# Patient Record
Sex: Female | Born: 1970 | Race: Black or African American | Hispanic: No | Marital: Married | Smoking: Former smoker
Health system: Southern US, Community
[De-identification: ages and names within clinical notes are randomized; demographics above are authoritative.]

## PROBLEM LIST (undated history)

## (undated) DIAGNOSIS — J90 Pleural effusion, not elsewhere classified: Secondary | ICD-10-CM

## (undated) DIAGNOSIS — Z8042 Family history of malignant neoplasm of prostate: Secondary | ICD-10-CM

## (undated) DIAGNOSIS — R51 Headache: Secondary | ICD-10-CM

## (undated) DIAGNOSIS — C50919 Malignant neoplasm of unspecified site of unspecified female breast: Secondary | ICD-10-CM

## (undated) DIAGNOSIS — I10 Essential (primary) hypertension: Secondary | ICD-10-CM

## (undated) DIAGNOSIS — K219 Gastro-esophageal reflux disease without esophagitis: Secondary | ICD-10-CM

## (undated) DIAGNOSIS — K220 Achalasia of cardia: Secondary | ICD-10-CM

## (undated) DIAGNOSIS — Z803 Family history of malignant neoplasm of breast: Secondary | ICD-10-CM

## (undated) HISTORY — DX: Malignant neoplasm of unspecified site of unspecified female breast: C50.919

## (undated) HISTORY — DX: Headache: R51

## (undated) HISTORY — DX: Pleural effusion, not elsewhere classified: J90

## (undated) HISTORY — DX: Family history of malignant neoplasm of prostate: Z80.42

## (undated) HISTORY — DX: Gastro-esophageal reflux disease without esophagitis: K21.9

## (undated) HISTORY — DX: Family history of malignant neoplasm of breast: Z80.3

---

## 1989-09-10 HISTORY — PX: OTHER SURGICAL HISTORY: SHX169

## 1993-09-10 HISTORY — PX: TONSILLECTOMY: SUR1361

## 1998-12-20 ENCOUNTER — Other Ambulatory Visit: Admission: RE | Admit: 1998-12-20 | Discharge: 1998-12-20 | Payer: Self-pay | Admitting: Obstetrics and Gynecology

## 1999-07-06 ENCOUNTER — Inpatient Hospital Stay (HOSPITAL_COMMUNITY): Admission: AD | Admit: 1999-07-06 | Discharge: 1999-07-10 | Payer: Self-pay | Admitting: Obstetrics and Gynecology

## 1999-07-11 ENCOUNTER — Encounter: Admission: RE | Admit: 1999-07-11 | Discharge: 1999-10-09 | Payer: Self-pay | Admitting: Obstetrics and Gynecology

## 1999-08-07 ENCOUNTER — Emergency Department (HOSPITAL_COMMUNITY): Admission: EM | Admit: 1999-08-07 | Discharge: 1999-08-07 | Payer: Self-pay | Admitting: Emergency Medicine

## 1999-08-09 ENCOUNTER — Other Ambulatory Visit: Admission: RE | Admit: 1999-08-09 | Discharge: 1999-08-09 | Payer: Self-pay | Admitting: Obstetrics and Gynecology

## 2000-09-26 ENCOUNTER — Ambulatory Visit (HOSPITAL_COMMUNITY): Admission: RE | Admit: 2000-09-26 | Discharge: 2000-09-26 | Payer: Self-pay | Admitting: Cardiology

## 2000-09-26 ENCOUNTER — Encounter: Payer: Self-pay | Admitting: Cardiology

## 2001-07-23 ENCOUNTER — Other Ambulatory Visit: Admission: RE | Admit: 2001-07-23 | Discharge: 2001-07-23 | Payer: Self-pay | Admitting: Obstetrics and Gynecology

## 2002-02-26 ENCOUNTER — Inpatient Hospital Stay (HOSPITAL_COMMUNITY): Admission: AD | Admit: 2002-02-26 | Discharge: 2002-02-28 | Payer: Self-pay | Admitting: Obstetrics and Gynecology

## 2002-08-27 ENCOUNTER — Other Ambulatory Visit: Admission: RE | Admit: 2002-08-27 | Discharge: 2002-08-27 | Payer: Self-pay | Admitting: Obstetrics and Gynecology

## 2003-04-21 ENCOUNTER — Ambulatory Visit: Admission: RE | Admit: 2003-04-21 | Discharge: 2003-04-21 | Payer: Self-pay | Admitting: Cardiology

## 2005-12-24 ENCOUNTER — Other Ambulatory Visit: Admission: RE | Admit: 2005-12-24 | Discharge: 2005-12-24 | Payer: Self-pay | Admitting: Gynecology

## 2011-05-15 ENCOUNTER — Inpatient Hospital Stay (HOSPITAL_COMMUNITY)
Admission: EM | Admit: 2011-05-15 | Discharge: 2011-05-17 | DRG: 392 | Disposition: A | Discharge status: Home / Self Care | Payer: Self-pay | Attending: Internal Medicine | Admitting: Internal Medicine

## 2011-05-15 ENCOUNTER — Emergency Department (HOSPITAL_COMMUNITY): Payer: Self-pay

## 2011-05-15 DIAGNOSIS — K222 Esophageal obstruction: Secondary | ICD-10-CM | POA: Diagnosis present

## 2011-05-15 DIAGNOSIS — D509 Iron deficiency anemia, unspecified: Secondary | ICD-10-CM | POA: Diagnosis present

## 2011-05-15 DIAGNOSIS — R0789 Other chest pain: Secondary | ICD-10-CM | POA: Diagnosis present

## 2011-05-15 DIAGNOSIS — K2980 Duodenitis without bleeding: Secondary | ICD-10-CM | POA: Diagnosis present

## 2011-05-15 DIAGNOSIS — K219 Gastro-esophageal reflux disease without esophagitis: Secondary | ICD-10-CM | POA: Diagnosis present

## 2011-05-15 DIAGNOSIS — K220 Achalasia of cardia: Principal | ICD-10-CM | POA: Diagnosis present

## 2011-05-15 LAB — CBC
HCT: 34.9 % — ABNORMAL LOW (ref 36.0–46.0)
HCT: 28.6 % — ABNORMAL LOW (ref 36.0–46.0)
Hemoglobin: 11.4 g/dL — ABNORMAL LOW (ref 12.0–15.0)
Hemoglobin: 9.5 g/dL — ABNORMAL LOW (ref 12.0–15.0)
MCH: 21.1 pg — ABNORMAL LOW (ref 26.0–34.0)
MCH: 20.9 pg — ABNORMAL LOW (ref 26.0–34.0)
MCHC: 32.7 g/dL (ref 30.0–36.0)
MCHC: 32.5 g/dL (ref 30.0–36.0)
MCV: 64.6 fL — ABNORMAL LOW (ref 78.0–100.0)
MCV: 64.4 fL — ABNORMAL LOW (ref 78.0–100.0)
Platelets: 232 10*3/uL (ref 150–400)
Platelets: 213 10*3/uL (ref 150–400)
RBC: 5.4 MIL/uL — ABNORMAL HIGH (ref 3.87–5.11)
RBC: 4.44 MIL/uL (ref 3.87–5.11)
RDW: 15.9 % — ABNORMAL HIGH (ref 11.5–15.5)
RDW: 15.8 % — ABNORMAL HIGH (ref 11.5–15.5)
WBC: 13.6 10*3/uL — ABNORMAL HIGH (ref 4.0–10.5)
WBC: 12.5 10*3/uL — ABNORMAL HIGH (ref 4.0–10.5)

## 2011-05-15 LAB — POCT I-STAT TROPONIN I: Troponin i, poc: 0 ng/mL (ref 0.00–0.08)

## 2011-05-15 LAB — DIFFERENTIAL
Basophils Absolute: 0 10*3/uL (ref 0.0–0.1)
Basophils Relative: 0 % (ref 0–1)
Eosinophils Absolute: 0.1 10*3/uL (ref 0.0–0.7)
Eosinophils Relative: 1 % (ref 0–5)
Lymphocytes Relative: 14 % (ref 12–46)
Lymphs Abs: 1.9 10*3/uL (ref 0.7–4.0)
Monocytes Absolute: 1.5 10*3/uL — ABNORMAL HIGH (ref 0.1–1.0)
Monocytes Relative: 11 % (ref 3–12)
Neutro Abs: 10.1 10*3/uL — ABNORMAL HIGH (ref 1.7–7.7)
Neutrophils Relative %: 74 % (ref 43–77)

## 2011-05-15 LAB — CK TOTAL AND CKMB (NOT AT ARMC)
CK, MB: 1.2 ng/mL (ref 0.3–4.0)
Relative Index: INVALID (ref 0.0–2.5)
Total CK: 70 U/L (ref 7–177)

## 2011-05-15 LAB — BASIC METABOLIC PANEL
BUN: 12 mg/dL (ref 6–23)
CO2: 23 mEq/L (ref 19–32)
Calcium: 9.4 mg/dL (ref 8.4–10.5)
Chloride: 107 mEq/L (ref 96–112)
Creatinine, Ser: 0.62 mg/dL (ref 0.50–1.10)
GFR calc Af Amer: >60 mL/min (ref >60)
GFR calc non Af Amer: >60 mL/min (ref >60)
Glucose, Bld: 83 mg/dL (ref 70–99)
Potassium: 3.5 mEq/L (ref 3.5–5.1)
Sodium: 142 mEq/L (ref 135–145)

## 2011-05-15 LAB — PROTIME-INR
INR: 1.29 (ref 0.00–1.49)
Prothrombin Time: 16.3 seconds — ABNORMAL HIGH (ref 11.6–15.2)

## 2011-05-15 LAB — D-DIMER, QUANTITATIVE: D-Dimer, Quant: 1.14 ug/mL-FEU — ABNORMAL HIGH (ref 0.00–0.48)

## 2011-05-15 MED ORDER — IOHEXOL 300 MG/ML  SOLN
100.0000 mL | Freq: Once | INTRAMUSCULAR | Status: AC | PRN
  Administered 2011-05-15: 100 mL via INTRAVENOUS

## 2011-05-16 LAB — COMPREHENSIVE METABOLIC PANEL
ALT: 11 U/L (ref 0–35)
AST: 10 U/L (ref 0–37)
Albumin: 3.1 g/dL — ABNORMAL LOW (ref 3.5–5.2)
Alkaline Phosphatase: 41 U/L (ref 39–117)
BUN: 9 mg/dL (ref 6–23)
CO2: 22 mEq/L (ref 19–32)
Calcium: 8.1 mg/dL — ABNORMAL LOW (ref 8.4–10.5)
Chloride: 106 mEq/L (ref 96–112)
Creatinine, Ser: 0.77 mg/dL (ref 0.50–1.10)
GFR calc Af Amer: >60 mL/min (ref >60)
GFR calc non Af Amer: >60 mL/min (ref >60)
Glucose, Bld: 119 mg/dL — ABNORMAL HIGH (ref 70–99)
Potassium: 3.7 mEq/L (ref 3.5–5.1)
Sodium: 137 mEq/L (ref 135–145)
Total Bilirubin: 0.9 mg/dL (ref 0.3–1.2)
Total Protein: 6.3 g/dL (ref 6.0–8.3)

## 2011-05-16 LAB — FERRITIN: Ferritin: 410 ng/mL — ABNORMAL HIGH (ref 10–291)

## 2011-05-16 LAB — IRON AND TIBC
Iron: <10 ug/dL — ABNORMAL LOW (ref 42–135)
UIBC: 170 ug/dL (ref 125–400)

## 2011-05-16 LAB — VITAMIN B12: Vitamin B-12: 483 pg/mL (ref 211–911)

## 2011-05-16 LAB — FOLATE: Folate: >20 ng/mL

## 2011-05-16 NOTE — H&P (Signed)
NAMEGERALINE, [name] NO.:  [id number]  MEDICAL RECORD NO.:  [id number]  LOCATION:  WLED                         FACILITY:  [facility]  PHYSICIAN:  Marinda Elk, M.D.DATE OF BIRTH:  1971-03-14  DATE OF ADMISSION:  05/15/2011 DATE OF DISCHARGE:                             HISTORY & PHYSICAL   PRIMARY CARE DOCTOR:  None.  CHIEF COMPLAINT:  Chest discomfort.  HISTORY OF PRESENT ILLNESS:  This is a 40 year old female with past medical history of achalasia diagnosed in 1988 by Dr. Rana Snare over at [facility], with multiple dilations.  She had surgery for achalasia in 1991 due to the risk of perforation if they continued to do dilations.  She moved to [facility] in 1992, has not followed up with her doctor.  She was doing quite well until Saturday when she ate some grapes and started vomiting. Since then she is only eating chicken broth but yesterday ate some salad and got this chest discomfort again.  She could feel something in the midchest, stuck.  She has been sleeping sitting up since then.  Taking deep breaths make it worse.  She relates only once vomiting, has felt slightly nauseated several times.  But denies any fever, chills, diarrhea or palpitations.  ALLERGIES:  No known drug allergies.  PAST MEDICAL HISTORY:  Significant for achalasia.  MEDICATIONS:  Tums, multivitamins, vinegar and mustard over-the-counter as she says this relieves and this helped with eating.  FAMILY HISTORY:  Her father is healthy.  Her mother has hypertension. No siblings with this.  She has no history of travel outside the [place].  SOCIAL HISTORY:  Smoked in the past.  Recreational, denies alcohol or drugs.  REVIEW OF SYSTEMS:  Ten-point review of systems done.  Pertinent positives per HPI.  PHYSICAL EXAM:  VITAL SIGNS:  Temperature 98, pulse of 98, blood pressure 106/65.  She was saturating 100% on room air, breathing 18 times per minute. GENERAL:  She is awake, alert, and  oriented x3. HEENT:  Atraumatic, normocephalic.  Dry mucous membranes. No icterus. No pallor. NECK:  No JVD.  No bruits.  No thyromegaly. CARDIOVASCULAR:  Regular rate and rhythm with positive S1 and S2.  No murmurs, rubs or gallops. LUNGS:  Good air movement but there are crackles in her left lung base, right is clear to auscultation. ABDOMEN:  Positive bowel sounds.  Abdomen is nontender, nondistended, soft. EXTREMITIES:  Positive pulses.  No clubbing, cyanosis or edema. SKIN:  No rashes or ulceration.  Has multiple healed scars in the back; all of them are old. NEUROLOGIC:  Nonfocal. LYMPHATIC:  Lymphadenopathy nonpalpable.  LABS ON ADMISSION:  First set of cardiac enzymes is negative x1.  Her D- dimer was 1.1.  Her white count was 13 with an ANC of 10.1, hemoglobin of 11.4 with an MCV of 64, RDW of 15.9, platelet count 232.  Sodium was 142, potassium 3.5, chloride 107, bicarb 23, glucose of 83, BUN of 12, creatinine 0.6, calcium 9.4.  Chest x-ray showed abnormal right-sided contour of the mediastinum most likely representing gas and material in _________ esophagus, possible achalasia due to her history.  CT angio of this chest  showed no PE, severely dilated esophagus with enteric content.  ASSESSMENT AND PLAN: 1. Upper GI obstruction at gastroesophageal junction:  I called GI,     Dr. Bosie Clos, for endoscopy.  He will see the patient today.  Will     admit the patient to the regular floor, start her on IV fluids.     Will put her n.p.o.; try to control her pain.  We will give Zofran     for nausea and Protonix. 2. Microcytic anemia:  We will check an anemia panel.  She is a     menstruating female, that is the most likely cause.  She still has     her uterus.     Marinda Elk, M.D.     AF/MEDQ  D:  05/15/2011  T:  05/15/2011  Job:  [id number]  Electronically Signed by Marinda Elk M.D. on 05/16/2011 08:41:05 AM

## 2011-05-17 ENCOUNTER — Inpatient Hospital Stay (HOSPITAL_COMMUNITY): Payer: Self-pay

## 2011-05-17 DIAGNOSIS — K220 Achalasia of cardia: Secondary | ICD-10-CM

## 2011-05-18 NOTE — Discharge Summary (Signed)
[name], [name]                   ACCOUNT NO.:  [id number]  MEDICAL RECORD NO.:  [id number]  LOCATION:  1310                         FACILITY:  [facility]  PHYSICIAN:  Andreas Blower, MD       DATE OF BIRTH:  1971/06/18  DATE OF ADMISSION:  05/15/2011 DATE OF DISCHARGE:  05/17/2011                              DISCHARGE SUMMARY   PRIMARY CARE PHYSICIAN:  The patient does not have one.  GASTROENTEROLOGIST:  Shirley Friar, MD  SURGEON:  Dr. Daphine Deutscher.  DISCHARGE DIAGNOSES: 1. Upper GI obstruction from achalasia, status post EGD with Botox     injection. 2. Anemia likely due to iron deficiency anemia. 3. Chest discomfort likely due to upper GI obstruction, resolved. 4. GERD on PPI. 5. History of achalasia with surgery in 1991.  DISCHARGE MEDICATIONS: 1. Ferrous sulfate 300 mg of liquid p.o. twice daily with meals. 2. Omeprazole 20 mg p.o. daily. 3. Multivitamin 1 tablet p.o. q. day.  BRIEF ADMITTING HISTORY AND PHYSICAL:  [name] is a 40 year old African American female with a history of achalasia diagnosed in 1988, status post surgery in 1991, who presented with a chest discomfort on May 15, 2011.  RADIOLOGY/IMAGING: 1. The patient had chest x-ray 2-view, which showed abnormal right-     sided contour of mediastinum, most likely representing gas and     material in distal esophagus. 2. The patient had CT of the chest with contrast, which shows no     pulmonary embolism.  Severely dilated esophagus with enteric     contents consistent with a history of achalasia.  An element of     gastroesophageal junction obstruction is suspected. 3. The patient had upper GI series on May 17, 2011, which showed     chronically dilated esophagus consistent with chronic achalasia.     Good flow of contrast through the gastroesophageal junction and     into stomach, status post recent Botox injection.  Normal gastric     emptying.  LABORATORY DATA:  CBC shows white count of  12.5, hemoglobin 9.5, hematocrit 28.6, platelet count 213.  Electrolytes normal with a BUN of 9 and creatinine 0.77.  Liver function tests normal except albumin of 3.1.  Serum iron is less than 10.  UIBC 170.  Vitamin B12 483.  Serum folate greater than 20.  Ferritin 410.  HOSPITAL COURSE BY PROBLEM: 1. Upper GI obstruction from achalasia.  The patient underwent EGD on     May 16, 2011, and had Botox injection.  Given her history of     achalasia and surgery in the past, Dr. Daphine Deutscher from [facility] evaluated the patient.  Upper GI series was     ordered.  As the patient's symptoms were improving, the patient     wanted to go home.  Dr. Magnus Ivan covering for Dr. Daphine Deutscher,     indicated that the patient can be discharged home and have the     patient follow up with Dr. Daphine Deutscher as outpatient.  Dr. Bosie Clos with     GI did the EGD, started the patient on a clear liquid diet and then  advanced the patient to soft mechanical at discharge. 2. Anemia likely due to iron deficiency anemia.  Started the patient     on supplemental iron. 3. Chest discomfort likely due to upper GI obstruction.  As her     obstructive symptoms resolved after EGD and Botox injection, her     chest discomfort also resolved. 4. GERD, started the patient on PPI.  DISPOSITION AND FOLLOWUP:  The patient to follow up with Dr. Daphine Deutscher with surgery in 1 week.  The patient to follow with Dr. Bosie Clos as needed. The patient to establish care with primary care physician.  Time spent on discharge talking to the patient and coordinating care was 25 minutes.     Andreas Blower, MD     SR/MEDQ  D:  05/17/2011  T:  05/18/2011  Job:  [id number]  Electronically Signed by Wardell Heath Charnae Lill  on 05/18/2011 09:13:40 PM

## 2011-05-30 NOTE — Consult Note (Signed)
  [name], [name]                   ACCOUNT NO.:  [id number]  MEDICAL RECORD NO.:  [id number]  LOCATION:  1310                         FACILITY:  [facility]  PHYSICIAN:  Shirley Friar, MDDATE OF BIRTH:  Mar 21, 1971  DATE OF CONSULTATION:  05/15/2011 DATE OF DISCHARGE:                                CONSULTATION   REQUESTING PHYSICIAN:  Hassan Buckler. Caporossi, MD  INDICATIONS:  Achalasia, dysphagia.  HISTORY OF PRESENT ILLNESS:  [name] is a pleasant 40 year old black female, who was diagnosed with achalasia at age 55 in her home town of [place], [place].  She had multiple dilations done prior to having a Heller myotomy done at age 65.  Over the year, she said she has had occasional episodes of difficulty swallowing, but never had any further endoscopic procedures, except she thinks she may have had one time in the past since surgery.  She came into the emergency room today because of severe chest pressure since it started Sunday, that was persistent and inability to take any p.o. due to this severe chest pressure.  On workup for this chest pressure, CT angiogram was done, which is negative for pulmonary thromboembolism.  It did show severely dilated esophagus with large amount of enteric contents in the esophagus.  She has been having nausea, but only minimal vomiting.  She says she has done a lot of diet modification in terms of avoiding certain foods that she has trouble swallowing.  PAST MEDICAL HISTORY:  Negative except as stated above.  MEDICATIONS ON ADMISSION:  Multivitamins and Tums.  FAMILY HISTORY:  Noncontributory.  SOCIAL HISTORY:  Denies alcohol or drugs.  History of smoking.  REVIEW OF SYSTEMS:  Negative from GI standpoint except stated above.  PHYSICAL EXAMINATION:  VITAL SIGNS:  Temperature 98, pulse 98, blood pressure 106/65. GENERAL:  Alert, well nourished.  No acute distress. ABDOMEN:  Epigastric tenderness, otherwise nontender, soft, nondistended, positive  bowel sounds.  LABS:  White blood count 13.6, hemoglobin 11.4, platelet count 232. Other labs are noted in hospital record and reviewed.  IMPRESSION:  A 40 year old black female with achalasia, status post Heller myotomy over 20 years ago, presenting with signs and symptoms of esophageal obstruction from her achalasia.  She may need a revision of her Heller myotomy, but we will start with endoscopy to remove food products and decide on possible Botox injection to temporarily improve her swallowing.  We will not recommend a pneumatic dilation in a patient status post Heller myotomy due to scar tissue and potentially increased risk of complication without improvement in her swallowing.  The patient may need to have surgical consult during this hospitalization versus go into [facility] since the Botox injection; if it is done, it will be temporary measure.     Shirley Friar, MD     VCS/MEDQ  D:  05/15/2011  T:  05/16/2011  Job:  [id number]  Electronically Signed by Charlott Rakes MD on 05/30/2011 11:00:42 AM

## 2011-05-30 NOTE — Op Note (Signed)
  [name], [name]                   ACCOUNT NO.:  [id number]  MEDICAL RECORD NO.:  [id number]  LOCATION:  1310                         FACILITY:  [facility]  PHYSICIAN:  Shirley Friar, MDDATE OF BIRTH:  Feb 05, 1971  DATE OF PROCEDURE: DATE OF DISCHARGE:                              OPERATIVE REPORT   PROCEDURE:  Upper endoscopy.  INDICATIONS:  Dysphagia, history of achalasia.  MEDICATIONS: 1. Fentanyl 100 mcg IV. 2. Versed 10 mg IV. 3. Cetacaine spray x1.  FINDINGS:  Endoscope was inserted into the oropharynx and esophagus was intubated, which was markedly dilated throughout its entire length. Starting in the midesophagus, there were food particles that obscured the lumen starting in the mid to distal esophagus.  These food particles were removed in several attempts with [name] Net as well as with the BioVac suction.  After removing these food particles, the underlying mucosa was noted to have a sloughing appearance and a mottled appearance.  The esophagus was severely dilated as stated above.  The endoscope was advanced down to the GE junction which was approximately 46 cm from the incisors.  The GE junction was narrowed and the endoscope was able to be advanced through this with mild resistance.  There was a large amount of food particles in the proximal and midstomach and dependent portions, which obscured visualization of the fundus and part of the cardia.  Endoscope was advanced down to the duodenal bulb which revealed scattered areas of erythema consistent with duodenitis. Endoscope was then advanced down to the second portion of the duodenum, which was unremarkable.  Endoscope was withdrawn back into the stomach back to the GE junction and Botox was used to inject 25 units in each of 4 quadrants for a total of 100 units into the lower esophageal sphincter area with good results.  Endoscope was then withdrawn to confirm the above findings.  ASSESSMENT: 1. Food  impaction, status post clearance with [name] Net and BioVac     suction as stated above. 2. Dilated esophagus and narrowed gastroesophageal junction consistent     with history of achalasia. 3. Tortuous esophagus secondary to achalasia from extreme dilation. 4. Mild duodenitis. 5. Status post Botox injection to the gastroesophageal junction with     good results.  PLAN: 1. Clear liquid diet. 2. Surgical consult by Dr. Daphine Deutscher and colleagues to see if she is a     candidate for revision of her Heller myotomy.     Shirley Friar, MD     VCS/MEDQ  D:  05/16/2011  T:  05/16/2011  Job:  [id number]  Electronically Signed by Charlott Rakes MD on 05/30/2011 11:00:49 AM

## 2011-06-27 ENCOUNTER — Encounter (INDEPENDENT_AMBULATORY_CARE_PROVIDER_SITE_OTHER): Payer: Self-pay | Admitting: Surgery

## 2011-06-27 ENCOUNTER — Ambulatory Visit (INDEPENDENT_AMBULATORY_CARE_PROVIDER_SITE_OTHER): Payer: Self-pay | Admitting: Surgery

## 2011-06-27 VITALS — BP 140/88 | HR 76 | Temp 98.0°F | Resp 16 | Ht 67.0 in | Wt 205.6 lb

## 2011-06-27 DIAGNOSIS — K220 Achalasia of cardia: Secondary | ICD-10-CM

## 2011-06-27 NOTE — Progress Notes (Signed)
Ms. Whitehorn comes in today with her mother and 2 daughters. About a month ago Dr. Bosie Clos injected her distal esophagus with Botox. She has recurrent achalasia after having had a transthoracic Heller myotomy in [place] about 1991.  Interestingly she reports taking mustard to treat some of the heartburn and indigestion she gets up times. Some people can take centimeter and is the same pain. At the present time she is getting good relief of symptoms with Botox. A thorough L. Last hopefully longer than just 4-6 weeks.  In a more aggressive laparoscopic Idell Pickles may leave her with some gastroesophageal reflux which case she replaced as one problem with another period I'm not sure she was to do that there are recommended. I think it would be exchanged to see  If she might be a candidate for an esophageal myotomy like to do it at [facility]. Another option would be to get her over to see GI department at [facility]. I'll discuss her with Dr. Bosie Clos and see her back in for 5 weeks.

## 2011-09-11 DIAGNOSIS — J90 Pleural effusion, not elsewhere classified: Secondary | ICD-10-CM

## 2011-09-11 HISTORY — DX: Pleural effusion, not elsewhere classified: J90

## 2011-10-08 ENCOUNTER — Emergency Department (HOSPITAL_COMMUNITY): Payer: Self-pay

## 2011-10-08 ENCOUNTER — Encounter (HOSPITAL_COMMUNITY)
Admission: EM | Disposition: A | Discharge status: Home / Self Care | Payer: Self-pay | Source: Home / Self Care | Attending: Emergency Medicine

## 2011-10-08 ENCOUNTER — Other Ambulatory Visit: Payer: Self-pay

## 2011-10-08 ENCOUNTER — Encounter (HOSPITAL_COMMUNITY): Payer: Self-pay | Admitting: Emergency Medicine

## 2011-10-08 ENCOUNTER — Ambulatory Visit (HOSPITAL_COMMUNITY): Admit: 2011-10-08 | Payer: Self-pay | Admitting: Gastroenterology

## 2011-10-08 ENCOUNTER — Ambulatory Visit (HOSPITAL_COMMUNITY)
Admission: EM | Admit: 2011-10-08 | Discharge: 2011-10-08 | Disposition: A | Discharge status: Home / Self Care | Payer: Self-pay | Attending: Emergency Medicine | Admitting: Emergency Medicine

## 2011-10-08 DIAGNOSIS — K219 Gastro-esophageal reflux disease without esophagitis: Secondary | ICD-10-CM | POA: Insufficient documentation

## 2011-10-08 DIAGNOSIS — IMO0002 Reserved for concepts with insufficient information to code with codable children: Secondary | ICD-10-CM | POA: Insufficient documentation

## 2011-10-08 DIAGNOSIS — R0602 Shortness of breath: Secondary | ICD-10-CM | POA: Insufficient documentation

## 2011-10-08 DIAGNOSIS — T18108A Unspecified foreign body in esophagus causing other injury, initial encounter: Secondary | ICD-10-CM | POA: Insufficient documentation

## 2011-10-08 DIAGNOSIS — K220 Achalasia of cardia: Secondary | ICD-10-CM | POA: Insufficient documentation

## 2011-10-08 DIAGNOSIS — Z79899 Other long term (current) drug therapy: Secondary | ICD-10-CM | POA: Insufficient documentation

## 2011-10-08 HISTORY — DX: Achalasia of cardia: K22.0

## 2011-10-08 HISTORY — PX: FOREIGN BODY REMOVAL: SHX962

## 2011-10-08 HISTORY — PX: ESOPHAGOGASTRODUODENOSCOPY: SHX5428

## 2011-10-08 LAB — CBC
HCT: 35.3 % — ABNORMAL LOW (ref 36.0–46.0)
Hemoglobin: 12 g/dL (ref 12.0–15.0)
MCH: 21.7 pg — ABNORMAL LOW (ref 26.0–34.0)
MCHC: 34 g/dL (ref 30.0–36.0)
MCV: 63.9 fL — ABNORMAL LOW (ref 78.0–100.0)
Platelets: 305 10*3/uL (ref 150–400)
RBC: 5.52 MIL/uL — ABNORMAL HIGH (ref 3.87–5.11)
RDW: 15.4 % (ref 11.5–15.5)
WBC: 19.7 10*3/uL — ABNORMAL HIGH (ref 4.0–10.5)

## 2011-10-08 LAB — BASIC METABOLIC PANEL
BUN: 8 mg/dL (ref 6–23)
CO2: 24 mEq/L (ref 19–32)
Calcium: 10.1 mg/dL (ref 8.4–10.5)
Chloride: 100 mEq/L (ref 96–112)
Creatinine, Ser: 0.84 mg/dL (ref 0.50–1.10)
GFR calc Af Amer: >90 mL/min (ref >90)
GFR calc non Af Amer: 85 mL/min — ABNORMAL LOW (ref >90)
Glucose, Bld: 131 mg/dL — ABNORMAL HIGH (ref 70–99)
Potassium: 3.8 mEq/L (ref 3.5–5.1)
Sodium: 136 mEq/L (ref 135–145)

## 2011-10-08 LAB — HEPATIC FUNCTION PANEL
ALT: 21 U/L (ref 0–35)
AST: 21 U/L (ref 0–37)
Albumin: 3.8 g/dL (ref 3.5–5.2)
Alkaline Phosphatase: 56 U/L (ref 39–117)
Bilirubin, Direct: 0.1 mg/dL (ref 0.0–0.3)
Indirect Bilirubin: 0.6 mg/dL (ref 0.3–0.9)
Total Bilirubin: 0.7 mg/dL (ref 0.3–1.2)
Total Protein: 8.5 g/dL — ABNORMAL HIGH (ref 6.0–8.3)

## 2011-10-08 LAB — POCT I-STAT TROPONIN I: Troponin i, poc: 0 ng/mL (ref 0.00–0.08)

## 2011-10-08 LAB — PRO B NATRIURETIC PEPTIDE: Pro B Natriuretic peptide (BNP): 142.7 pg/mL — ABNORMAL HIGH (ref 0–125)

## 2011-10-08 LAB — LIPASE, BLOOD: Lipase: 24 U/L (ref 11–59)

## 2011-10-08 SURGERY — EGD (ESOPHAGOGASTRODUODENOSCOPY)
Anesthesia: Moderate Sedation | Site: Esophagus

## 2011-10-08 MED ORDER — ONDANSETRON HCL 4 MG/2ML IJ SOLN
INTRAMUSCULAR | Status: AC
  Administered 2011-10-08: 4 mg via INTRAVENOUS
  Filled 2011-10-08: qty 2

## 2011-10-08 MED ORDER — FENTANYL CITRATE 0.05 MG/ML IJ SOLN
INTRAMUSCULAR | Status: AC
  Filled 2011-10-08: qty 2

## 2011-10-08 MED ORDER — ONDANSETRON HCL 4 MG/2ML IJ SOLN
INTRAMUSCULAR | Status: AC
  Administered 2011-10-08: 4 mg
  Filled 2011-10-08: qty 2

## 2011-10-08 MED ORDER — BUTAMBEN-TETRACAINE-BENZOCAINE 2-2-14 % EX AERO
INHALATION_SPRAY | CUTANEOUS | Status: DC | PRN
  Administered 2011-10-08: 2 via TOPICAL

## 2011-10-08 MED ORDER — MIDAZOLAM HCL 10 MG/2ML IJ SOLN
INTRAMUSCULAR | Status: DC | PRN
  Administered 2011-10-08 (×5): 2 mg via INTRAVENOUS

## 2011-10-08 MED ORDER — FENTANYL CITRATE 0.05 MG/ML IJ SOLN
INTRAMUSCULAR | Status: DC | PRN
  Administered 2011-10-08: 10 ug via INTRAVENOUS
  Administered 2011-10-08: 25 ug via INTRAVENOUS
  Administered 2011-10-08: 15 ug via INTRAVENOUS
  Administered 2011-10-08 (×2): 25 ug via INTRAVENOUS

## 2011-10-08 MED ORDER — MIDAZOLAM HCL 10 MG/2ML IJ SOLN
INTRAMUSCULAR | Status: AC
  Filled 2011-10-08: qty 2

## 2011-10-08 MED ORDER — FENTANYL CITRATE 0.05 MG/ML IJ SOLN
50.0000 ug | Freq: Once | INTRAMUSCULAR | Status: AC
  Administered 2011-10-08: 50 ug via INTRAVENOUS
  Filled 2011-10-08: qty 2

## 2011-10-08 MED ORDER — SODIUM CHLORIDE 0.9 % IV SOLN
Freq: Once | INTRAVENOUS | Status: AC
  Administered 2011-10-08: 11:00:00 via INTRAVENOUS

## 2011-10-08 MED ORDER — SODIUM CHLORIDE 0.9 % IJ SOLN
100.0000 [IU] | INTRAMUSCULAR | Status: AC
  Administered 2011-10-08: 100 [IU] via SUBMUCOSAL
  Filled 2011-10-08: qty 100

## 2011-10-08 MED ORDER — SODIUM CHLORIDE 0.9 % IV BOLUS (SEPSIS)
1000.0000 mL | Freq: Once | INTRAVENOUS | Status: AC
  Administered 2011-10-08: 1000 mL via INTRAVENOUS

## 2011-10-08 MED ORDER — ONDANSETRON HCL 4 MG/2ML IJ SOLN
4.0000 mg | Freq: Once | INTRAMUSCULAR | Status: AC
  Administered 2011-10-08: 4 mg via INTRAVENOUS

## 2011-10-08 MED ORDER — MORPHINE SULFATE 4 MG/ML IJ SOLN
4.0000 mg | Freq: Once | INTRAMUSCULAR | Status: AC
  Administered 2011-10-08: 4 mg via INTRAVENOUS
  Filled 2011-10-08: qty 1

## 2011-10-08 NOTE — ED Provider Notes (Signed)
History     CSN: [id number]  Arrival date & time 10/08/11  1191   First MD Initiated Contact with Patient 10/08/11 [phone]      Chief Complaint  Patient presents with  . Chest Pain    (Consider location/radiation/quality/duration/timing/severity/associated sxs/prior treatment) The history is provided by the patient.    The patient is a 41 year old female with history of achalasia and a surgical procedure in 1991 that involved cutting her esophageal muscles. In September, she presented to the emergency department with epigastric pain and was found to have a recurrence of her achalasia with an upper GI obstruction-she had an EGD and Botox injections at that time by Dr. Bosie Clos. She presents today with a recurrence of epigastric pain described as heavy and tight that began Thursday evening but is much worse in the last 2 days. It does radiate to the neck and has been associated with one episode of vomiting undigested food today. There has been associated shortness of breath, as deep breathing makes the pain worse, as well as sweats. She denies any fever or cough. The pain is worse at night or with lying down. Nothing relieves the pain, though she has tried eating mustard and using Zantac. Reports to me that this feels exactly like the discomfort she had prior to her admission in September. She initially planned to wait until Dr. Marge Duncans office opened today to followup there but her pain became too severe to wait.  She denies any personal history or early family history of coronary artery disease. She denies any history of a DVT or PE.    Past Medical History  Diagnosis Date  . GERD (gastroesophageal reflux disease)   . Heart murmur   . GERD (gastroesophageal reflux disease)   . Achalasia     Past Surgical History  Procedure Date  . Achalasia surgery 1991  . Achalasia surgery     Family History  Problem Relation Age of Onset  . Cancer Paternal Grandmother     breast     History  Substance Use Topics  . Smoking status: Former Games developer  . Smokeless tobacco: Not on file  . Alcohol Use: No     Review of Systems  Constitutional: Negative for fever and chills.  HENT: Negative for congestion, sore throat, trouble swallowing, neck pain and neck stiffness.   Eyes: Negative for pain and visual disturbance.  Respiratory: Positive for shortness of breath. Negative for cough and choking.   Cardiovascular: Negative for leg swelling.       See HPI  Gastrointestinal:       See HPI  Genitourinary: Negative for dysuria and hematuria.  Musculoskeletal: Negative for back pain and gait problem.  Skin: Negative for rash and wound.  Neurological: Negative for weakness, numbness and headaches.  Psychiatric/Behavioral: Negative for confusion.    Allergies  Review of patient's allergies indicates no known allergies.  Home Medications   Current Outpatient Rx  Name Route Sig Dispense Refill  . MULTIVITAMINS PO CAPS Oral Take 1 capsule by mouth daily.      Marland Kitchen RANITIDINE HCL 150 MG PO TABS Oral Take 75 mg by mouth daily.      BP 104/63  Pulse 117  Temp(Src) 97.9 F (36.6 C) (Oral)  Resp 16  SpO2 100%  LMP 09/06/2011  Physical Exam  Nursing note and vitals reviewed. Constitutional: She is oriented to person, place, and time. She appears well-developed and well-nourished. She appears distressed.  HENT:  Head: Normocephalic and atraumatic.  Right  Ear: External ear normal.  Left Ear: External ear normal.  Nose: Nose normal.  Mouth/Throat: Oropharynx is clear and moist.  Eyes: Pupils are equal, round, and reactive to light.  Neck: Normal range of motion. Neck supple. No JVD present.  Cardiovascular: Regular rhythm, normal heart sounds and intact distal pulses.        tachycardia  Pulmonary/Chest: Breath sounds normal. No accessory muscle usage. Tachypnea noted. She exhibits no tenderness and no deformity.  Abdominal: Soft. Bowel sounds are normal. She  exhibits no distension. There is no tenderness.  Musculoskeletal: She exhibits no edema and no tenderness.  Neurological: She is alert and oriented to person, place, and time. No cranial nerve deficit.  Skin: Skin is warm. No rash noted. She is diaphoretic.    ED Course  Procedures (including critical care time)  Labs Reviewed  CBC - Abnormal; Notable for the following:    WBC 19.7 (*)    RBC 5.52 (*)    HCT 35.3 (*)    MCV 63.9 (*)    MCH 21.7 (*)    All other components within normal limits  BASIC METABOLIC PANEL - Abnormal; Notable for the following:    Glucose, Bld 131 (*)    GFR calc non Af Amer 85 (*)    All other components within normal limits  PRO B NATRIURETIC PEPTIDE - Abnormal; Notable for the following:    Pro B Natriuretic peptide (BNP) 142.7 (*)    All other components within normal limits  HEPATIC FUNCTION PANEL - Abnormal; Notable for the following:    Total Protein 8.5 (*)    All other components within normal limits  POCT I-STAT TROPONIN I  LIPASE, BLOOD  I-STAT TROPONIN I   Dg Chest 2 View  10/08/2011  *RADIOLOGY REPORT*  Clinical Data: Chest pain.  History of surgery for achalasia.  CHEST - 2 VIEW  Comparison: CT chest and chest radiograph 05/15/2011.  Findings: Trachea is midline.  Heart is enlarged.  Dilated and debris-filled esophagus is seen in the medial right hemithorax, as before.  Mild interstitial prominence.  Bibasilar air space disease is noted as well, with tiny bilateral pleural effusions.  IMPRESSION: Suspect mild congestive heart failure with bibasilar air space disease.  Original Report Authenticated By: Reyes Ivan, M.D.    Date: 10/08/2011  Rate: 121  Rhythm: sinus tachycardia  QRS Axis: right  Intervals: normal  ST/T Wave abnormalities: early repolarization  Conduction Disutrbances:none  Narrative Interpretation: rate increased from prior ECG, through this is likely due to pain  Old EKG Reviewed: unchanged    1. Achalasia        MDM  6:30 AM Pt seen and evaluated. Initial H&P completed. Workup initiated. Hx achalasia, suspect same today. ECG and troponin ordered to eval for ACS, though suspicion is low.  7:40 AM Labs reviewed. Leukocytosis on CBC. Spoke with Dr Ewing Schlein, who requests additional testing to r/o biliary or pancreatic pathology for pain.   9:45 Spoke with Dr Bosie Clos to discuss plan. Dr Ewing Schlein is in hospital today and will consult. Will page triad for admission per request.  10:10 AM Spoke with Dr Ewing Schlein, who will perform upper endoscopy with likely botox injection, will re-assess need for admission post-procedure.      Pt was discharged home post-EGD.         [address] [place], [place] 10/09/11 [phone]

## 2011-10-08 NOTE — Op Note (Signed)
[facility] [address] [place], [place]  [address]  ENDOSCOPY PROCEDURE REPORT  PATIENT:  Rebecca, Chambers  MR#:  [id number] BIRTHDATE:  August 06, 1971, 40 yrs. old  GENDER:  female  ENDOSCOPIST:  Vida Rigger, MD Referred by:  Luretha Murphy, M.D.  PROCEDURE DATE:  10/08/2011 PROCEDURE:  EGD with foreign body removal, Esophagoscopy with submucosal botulinum toxin injection ASA CLASS:  Class II INDICATIONS:  patient with history of achalasia now with food impaction  MEDICATIONS:  100 mcg fentanyl 10 mg Versed TOPICAL ANESTHETIC: Used  DESCRIPTION OF PROCEDURE:   After the risks benefits and alternatives of the procedure were thoroughly explained, informed consent was obtained.  The Pentax Gastroscope [id number] and [id number] [id number]) endoscope was introduced through the mouth and advanced to the second portion of the duodenum, without limitations.  The instrument was slowly withdrawn as the mucosa was fully examined. There was an obvious food impaction with most of the esophagus filled with food and debris and obvious achalasia at the GE junction where the scope could pass with gentle pressure. A quick look at the stomach and duodenum were okay and to remove the food required multiple Roth nets with multiple particle removal as well as suctioning with the biovac. A total of 1800 cc was removed and once the esophagus was cleared of food we went ahead and injected 100 mg of Botox in the customary fashion of 1 cc into 4 different quadrants at the GE junction. The scope was then removed patient tolerated the procedure well there was no obvious immediate complication <<PROCEDUREIMAGES>>  FINDINGS: 1. Food impaction status post removal as above 2. Obvious achalasia status post Botox injection at the end of the procedure 3. Otherwise within normal limits EGD  COMPLICATIONS: None  ENDOSCOPIC IMPRESSION:  Above  RECOMMENDATIONS: Observe for a delayed complications  clear liquids only for one or 2 days and followup with both my partner Dr. Bosie Clos in one to 2 weeks and Dr. Daphine Deutscher of surgery to rediscuss surgical options which I believe she needs  REPEAT EXAM:  As needed  ______________________________ Vida Rigger, MD  CC:  Luretha Murphy, MD  n. Rosalie DoctorVida Rigger at 10/08/2011 12:20 PM  Leamon Arnt, [phone]

## 2011-10-08 NOTE — ED Provider Notes (Signed)
Patient has history of achalasia and had surgery 1991 where they clipped her muscles of her distal esophagus. She relates she did well until about 5 months ago when she had to be stretched by Dr. Bosie Clos. She relates about 5 days ago she ate marinated steak. The following day she started getting pain that has a pleuritic component, she states she feels like she has a weight on her chest. She states she is drinking liquids and feels like it's going down, and has been afraid to eat solids. She also relates she has started having heartburn and 8 mustard without relief. The heartburn gets worse at night and she has had to sleep sitting up in a chair the past 2 nights. She relates she did start taking Zantac. She states this morning she vomited and had undigested food come up. She feels short of breath but she denies cough. She states her pain has a pleuritic component to it.  Patient is alert and awake, she does appear to have some tachypnea.  Medical screening examination/treatment/procedure(s) were conducted as a shared visit with non-physician practitioner(s) and myself.  I personally evaluated the patient during the encounter Devoria Albe, MD, Franz Dell, MD 10/08/11 1536

## 2011-10-08 NOTE — Consult Note (Signed)
Reason for Consult: Patient with achalasia and probable food impaction Referring Physician: ER physician  [name] is an 40 y.o. female.  HPI: Patient known to my partner with a similar admission in September requiring endoscopy to remove food and a Botox injection who had done well until about 5 days ago when swallowing problems increased and increased pain in her mid chest and abdomen. She had seen a surgeon about redo achalasia surgery but they agreed to hold off. She has not had any significant other medical issues and no other complaints although she will use occasional Aleve for headaches and she just started Zantac on Saturday but her family history is negative for any GI or swallowing problems  Past Medical History  Diagnosis Date  . GERD (gastroesophageal reflux disease)   . Heart murmur   . GERD (gastroesophageal reflux disease)   . Achalasia     Past Surgical History  Procedure Date  . Achalasia surgery 1991  . Achalasia surgery     Family History  Problem Relation Age of Onset  . Cancer Paternal Grandmother     breast    Social History:  reports that she quit smoking about 21 years ago. She does not have any smokeless tobacco history on file. She reports that she does not drink alcohol or use illicit drugs.  Allergies: No Known Allergies  Medications: I have reviewed the patient's current medications.  Results for orders placed during the hospital encounter of 10/08/11 (from the past 48 hour(s))  CBC     Status: Abnormal   Collection Time   10/08/11  6:51 AM      Component Value Range Comment   WBC 19.7 (*) 4.0 - 10.5 (K/uL)    RBC 5.52 (*) 3.87 - 5.11 (MIL/uL)    Hemoglobin 12.0  12.0 - 15.0 (g/dL)    HCT 35.3 (*) 36.0 - 46.0 (%)    MCV 63.9 (*) 78.0 - 100.0 (fL)    MCH 21.7 (*) 26.0 - 34.0 (pg)    MCHC 34.0  30.0 - 36.0 (g/dL)    RDW 15.4  11.5 - 15.5 (%)    Platelets 305  150 - 400 (K/uL)   BASIC METABOLIC PANEL     Status: Abnormal   Collection Time   10/08/11  6:51 AM      Component Value Range Comment   Sodium 136  135 - 145 (mEq/L)    Potassium 3.8  3.5 - 5.1 (mEq/L)    Chloride 100  96 - 112 (mEq/L)    CO2 24  19 - 32 (mEq/L)    Glucose, Bld 131 (*) 70 - 99 (mg/dL)    BUN 8  6 - 23 (mg/dL)    Creatinine, Ser 0.84  0.50 - 1.10 (mg/dL)    Calcium 10.1  8.4 - 10.5 (mg/dL)    GFR calc non Af Amer 85 (*) >90 (mL/min)    GFR calc Af Amer >90  >90 (mL/min)   LIPASE, BLOOD     Status: Normal   Collection Time   10/08/11  6:51 AM      Component Value Range Comment   Lipase 24  11 - 59 (U/L)   HEPATIC FUNCTION PANEL     Status: Abnormal   Collection Time   10/08/11  6:51 AM      Component Value Range Comment   Total Protein 8.5 (*) 6.0 - 8.3 (g/dL)    Albumin 3.8  3.5 - 5.2 (g/dL)      AST 21  0 - 37 (U/L)    ALT 21  0 - 35 (U/L)    Alkaline Phosphatase 56  39 - 117 (U/L)    Total Bilirubin 0.7  0.3 - 1.2 (mg/dL)    Bilirubin, Direct 0.1  0.0 - 0.3 (mg/dL)    Indirect Bilirubin 0.6  0.3 - 0.9 (mg/dL)   POCT I-STAT TROPONIN I     Status: Normal   Collection Time   10/08/11  6:58 AM      Component Value Range Comment   Troponin i, poc 0.00  0.00 - 0.08 (ng/mL)    Comment 3            PRO B NATRIURETIC PEPTIDE     Status: Abnormal   Collection Time   10/08/11  7:54 AM      Component Value Range Comment   Pro B Natriuretic peptide (BNP) 142.7 (*) 0 - 125 (pg/mL)     Dg Chest 2 View  10/08/2011  *RADIOLOGY REPORT*  Clinical Data: Chest pain.  History of surgery for achalasia.  CHEST - 2 VIEW  Comparison: CT chest and chest radiograph 05/15/2011.  Findings: Trachea is midline.  Heart is enlarged.  Dilated and debris-filled esophagus is seen in the medial right hemithorax, as before.  Mild interstitial prominence.  Bibasilar air space disease is noted as well, with tiny bilateral pleural effusions.  IMPRESSION: Suspect mild congestive heart failure with bibasilar air space disease.  Original Report Authenticated By: MELINDA A. BLIETZ, M.D.      ROS negative except above and I have discussed her case with both my partner and ER physician Blood pressure 103/66, pulse 106, temperature 98.9 F (37.2 C), temperature source Oral, resp. rate 30, last menstrual period 09/06/2011, SpO2 97.00%. Physical Exam vital signs stable afebrile no acute distress except without obvious adenopathy lungs are clear heart regular rate and rhythm abdomen is soft nontender no chest pain on palpation labs and x-ray reviewed Assessment/Plan: Achalasia with recurrent food impaction Plan: The risks benefits and methods of endoscopy and food disimpaction and repeat Botox injection was discussed and will proceed ASAP and we also discussed long-term care should probably include repeat surgery   Koltin Wehmeyer E 10/08/2011, 11:14 AM      

## 2011-10-08 NOTE — ED Notes (Signed)
PT. REPROTS EPIGASTRIC PAIN / SUBSTERNAL CHEST PAIN ONSET LAST Friday WORSE THIS MORNING WITH SOB AND NAUSEA.

## 2011-10-09 ENCOUNTER — Encounter (HOSPITAL_COMMUNITY): Payer: Self-pay | Admitting: Gastroenterology

## 2011-10-09 NOTE — ED Provider Notes (Signed)
See prior note   Ward Givens, MD 10/09/11 [phone]

## 2011-10-12 ENCOUNTER — Encounter (HOSPITAL_COMMUNITY): Payer: Self-pay

## 2011-10-12 ENCOUNTER — Ambulatory Visit (HOSPITAL_COMMUNITY)
Admission: RE | Admit: 2011-10-12 | Discharge: 2011-10-12 | Disposition: A | Discharge status: Home / Self Care | Payer: PRIVATE HEALTH INSURANCE | Source: Ambulatory Visit | Attending: Surgery | Admitting: Surgery

## 2011-10-12 ENCOUNTER — Encounter (INDEPENDENT_AMBULATORY_CARE_PROVIDER_SITE_OTHER): Payer: Self-pay | Admitting: Surgery

## 2011-10-12 ENCOUNTER — Ambulatory Visit (INDEPENDENT_AMBULATORY_CARE_PROVIDER_SITE_OTHER): Payer: PRIVATE HEALTH INSURANCE | Admitting: Surgery

## 2011-10-12 ENCOUNTER — Other Ambulatory Visit (INDEPENDENT_AMBULATORY_CARE_PROVIDER_SITE_OTHER): Payer: Self-pay | Admitting: General Surgery

## 2011-10-12 VITALS — BP 142/84 | HR 66 | Temp 100.0°F | Resp 18 | Ht 66.0 in | Wt 206.8 lb

## 2011-10-12 DIAGNOSIS — K220 Achalasia of cardia: Secondary | ICD-10-CM | POA: Insufficient documentation

## 2011-10-12 DIAGNOSIS — J9851 Mediastinitis: Secondary | ICD-10-CM

## 2011-10-12 DIAGNOSIS — I313 Pericardial effusion (noninflammatory): Secondary | ICD-10-CM

## 2011-10-12 DIAGNOSIS — R509 Fever, unspecified: Secondary | ICD-10-CM | POA: Insufficient documentation

## 2011-10-12 DIAGNOSIS — R0602 Shortness of breath: Secondary | ICD-10-CM | POA: Insufficient documentation

## 2011-10-12 DIAGNOSIS — R079 Chest pain, unspecified: Secondary | ICD-10-CM | POA: Insufficient documentation

## 2011-10-12 MED ORDER — AMOXICILLIN-POT CLAVULANATE 875-125 MG PO TABS: 1.0000 | ORAL_TABLET | Freq: Two times a day (BID) | ORAL | Status: DC

## 2011-10-12 MED ORDER — IOHEXOL 300 MG/ML  SOLN
80.0000 mL | Freq: Once | INTRAMUSCULAR | Status: AC | PRN
  Administered 2011-10-12: 80 mL via INTRAVENOUS

## 2011-10-12 NOTE — Progress Notes (Signed)
[name] and her mother come in today as she wants something done for her recurrent achalasia.  She had an endoscopy and Botox injection on Monday by Dr. Ewing Schlein and  since and has had chest pain and fevers.  Today her temperature is 100 her pulse rate is 66. She's complained of pain with inspiration and lying on her right side she said she can't catch her breath. He also had some pain in her neck. Since she has had a lot of food material in her esophagus it could be that she has mediastinitis after her Botox injections.  I would be willing to proceed with laparoscopic Heller myotomy for achalasia but in the near term we need to see if she has evidence of significant mediastinitis on CT scan. If so she may require hospitalization or at least antibiotics. Will schedule CT scan of the chest for today. Subsequent disposition based on findings.

## 2011-10-12 NOTE — Patient Instructions (Signed)
Obtain CT scan of chest today and await followup from our office

## 2011-10-16 ENCOUNTER — Encounter (HOSPITAL_COMMUNITY)
Admission: RE | Disposition: A | Discharge status: Home / Self Care | Payer: Self-pay | Source: Ambulatory Visit | Attending: Gastroenterology

## 2011-10-16 ENCOUNTER — Ambulatory Visit (HOSPITAL_COMMUNITY): Admit: 2011-10-16 | Payer: Self-pay | Admitting: Gastroenterology

## 2011-10-16 ENCOUNTER — Encounter (HOSPITAL_COMMUNITY): Payer: Self-pay | Admitting: Gastroenterology

## 2011-10-16 ENCOUNTER — Ambulatory Visit (HOSPITAL_COMMUNITY)
Admission: RE | Admit: 2011-10-16 | Discharge: 2011-10-16 | Disposition: A | Discharge status: Home / Self Care | Payer: PRIVATE HEALTH INSURANCE | Source: Ambulatory Visit | Attending: Gastroenterology | Admitting: Gastroenterology

## 2011-10-16 ENCOUNTER — Encounter (HOSPITAL_COMMUNITY): Payer: Self-pay

## 2011-10-16 DIAGNOSIS — J90 Pleural effusion, not elsewhere classified: Secondary | ICD-10-CM | POA: Insufficient documentation

## 2011-10-16 DIAGNOSIS — T18108A Unspecified foreign body in esophagus causing other injury, initial encounter: Secondary | ICD-10-CM | POA: Insufficient documentation

## 2011-10-16 DIAGNOSIS — IMO0002 Reserved for concepts with insufficient information to code with codable children: Secondary | ICD-10-CM | POA: Insufficient documentation

## 2011-10-16 DIAGNOSIS — I517 Cardiomegaly: Secondary | ICD-10-CM | POA: Insufficient documentation

## 2011-10-16 DIAGNOSIS — K219 Gastro-esophageal reflux disease without esophagitis: Secondary | ICD-10-CM | POA: Insufficient documentation

## 2011-10-16 DIAGNOSIS — R0789 Other chest pain: Secondary | ICD-10-CM | POA: Insufficient documentation

## 2011-10-16 DIAGNOSIS — K220 Achalasia of cardia: Secondary | ICD-10-CM | POA: Insufficient documentation

## 2011-10-16 HISTORY — PX: ESOPHAGOGASTRODUODENOSCOPY: SHX5428

## 2011-10-16 HISTORY — PX: FOREIGN BODY REMOVAL: SHX962

## 2011-10-16 SURGERY — EGD (ESOPHAGOGASTRODUODENOSCOPY)
Anesthesia: Moderate Sedation

## 2011-10-16 MED ORDER — FENTANYL NICU IV SYRINGE 50 MCG/ML
INJECTION | INTRAMUSCULAR | Status: DC | PRN
  Administered 2011-10-16 (×4): 25 ug via INTRAVENOUS

## 2011-10-16 MED ORDER — MIDAZOLAM HCL 10 MG/2ML IJ SOLN
INTRAMUSCULAR | Status: AC
  Filled 2011-10-16: qty 2

## 2011-10-16 MED ORDER — DIPHENHYDRAMINE HCL 50 MG/ML IJ SOLN
INTRAMUSCULAR | Status: AC
  Filled 2011-10-16: qty 1

## 2011-10-16 MED ORDER — DIPHENHYDRAMINE HCL 50 MG/ML IJ SOLN
INTRAMUSCULAR | Status: DC | PRN
  Administered 2011-10-16 (×2): 25 mg via INTRAVENOUS

## 2011-10-16 MED ORDER — FENTANYL CITRATE 0.05 MG/ML IJ SOLN
INTRAMUSCULAR | Status: AC
  Filled 2011-10-16: qty 2

## 2011-10-16 MED ORDER — SODIUM CHLORIDE 0.9 % IV SOLN
Freq: Once | INTRAVENOUS | Status: AC
  Administered 2011-10-16: 500 mL via INTRAVENOUS

## 2011-10-16 MED ORDER — BUTAMBEN-TETRACAINE-BENZOCAINE 2-2-14 % EX AERO
INHALATION_SPRAY | CUTANEOUS | Status: DC | PRN
  Administered 2011-10-16: 2 via TOPICAL

## 2011-10-16 MED ORDER — MIDAZOLAM HCL 10 MG/2ML IJ SOLN
INTRAMUSCULAR | Status: DC | PRN
  Administered 2011-10-16 (×4): 2 mg via INTRAVENOUS

## 2011-10-16 NOTE — H&P (View-Only) (Signed)
Reason for Consult: Patient with achalasia and probable food impaction Referring Physician: ER physician  [name] is an 41 y.o. female.  HPI: Patient known to my partner with a similar admission in September requiring endoscopy to remove food and a Botox injection who had done well until about 5 days ago when swallowing problems increased and increased pain in her mid chest and abdomen. She had seen a surgeon about redo achalasia surgery but they agreed to hold off. She has not had any significant other medical issues and no other complaints although she will use occasional Aleve for headaches and she just started Zantac on Saturday but her family history is negative for any GI or swallowing problems  Past Medical History  Diagnosis Date  . GERD (gastroesophageal reflux disease)   . Heart murmur   . GERD (gastroesophageal reflux disease)   . Achalasia     Past Surgical History  Procedure Date  . Achalasia surgery 1991  . Achalasia surgery     Family History  Problem Relation Age of Onset  . Cancer Paternal Grandmother     breast    Social History:  reports that she quit smoking about 21 years ago. She does not have any smokeless tobacco history on file. She reports that she does not drink alcohol or use illicit drugs.  Allergies: No Known Allergies  Medications: I have reviewed the patient's current medications.  Results for orders placed during the hospital encounter of 10/08/11 (from the past 48 hour(s))  CBC     Status: Abnormal   Collection Time   10/08/11  6:51 AM      Component Value Range Comment   WBC 19.7 (*) 4.0 - 10.5 (K/uL)    RBC 5.52 (*) 3.87 - 5.11 (MIL/uL)    Hemoglobin 12.0  12.0 - 15.0 (g/dL)    HCT 40.9 (*) 81.1 - 46.0 (%)    MCV 63.9 (*) 78.0 - 100.0 (fL)    MCH 21.7 (*) 26.0 - 34.0 (pg)    MCHC 34.0  30.0 - 36.0 (g/dL)    RDW 91.4  78.2 - 95.6 (%)    Platelets 305  150 - 400 (K/uL)   BASIC METABOLIC PANEL     Status: Abnormal   Collection Time   10/08/11  6:51 AM      Component Value Range Comment   Sodium 136  135 - 145 (mEq/L)    Potassium 3.8  3.5 - 5.1 (mEq/L)    Chloride 100  96 - 112 (mEq/L)    CO2 24  19 - 32 (mEq/L)    Glucose, Bld 131 (*) 70 - 99 (mg/dL)    BUN 8  6 - 23 (mg/dL)    Creatinine, Ser 2.13  0.50 - 1.10 (mg/dL)    Calcium 08.6  8.4 - 10.5 (mg/dL)    GFR calc non Af Amer 85 (*) >90 (mL/min)    GFR calc Af Amer >90  >90 (mL/min)   LIPASE, BLOOD     Status: Normal   Collection Time   10/08/11  6:51 AM      Component Value Range Comment   Lipase 24  11 - 59 (U/L)   HEPATIC FUNCTION PANEL     Status: Abnormal   Collection Time   10/08/11  6:51 AM      Component Value Range Comment   Total Protein 8.5 (*) 6.0 - 8.3 (g/dL)    Albumin 3.8  3.5 - 5.2 (g/dL)  AST 21  0 - 37 (U/L)    ALT 21  0 - 35 (U/L)    Alkaline Phosphatase 56  39 - 117 (U/L)    Total Bilirubin 0.7  0.3 - 1.2 (mg/dL)    Bilirubin, Direct 0.1  0.0 - 0.3 (mg/dL)    Indirect Bilirubin 0.6  0.3 - 0.9 (mg/dL)   POCT I-STAT TROPONIN I     Status: Normal   Collection Time   10/08/11  6:58 AM      Component Value Range Comment   Troponin i, poc 0.00  0.00 - 0.08 (ng/mL)    Comment 3            PRO B NATRIURETIC PEPTIDE     Status: Abnormal   Collection Time   10/08/11  7:54 AM      Component Value Range Comment   Pro B Natriuretic peptide (BNP) 142.7 (*) 0 - 125 (pg/mL)     Dg Chest 2 View  10/08/2011  *RADIOLOGY REPORT*  Clinical Data: Chest pain.  History of surgery for achalasia.  CHEST - 2 VIEW  Comparison: CT chest and chest radiograph 05/15/2011.  Findings: Trachea is midline.  Heart is enlarged.  Dilated and debris-filled esophagus is seen in the medial right hemithorax, as before.  Mild interstitial prominence.  Bibasilar air space disease is noted as well, with tiny bilateral pleural effusions.  IMPRESSION: Suspect mild congestive heart failure with bibasilar air space disease.  Original Report Authenticated By: Reyes Ivan, M.D.      ROS negative except above and I have discussed her case with both my partner and ER physician Blood pressure 103/66, pulse 106, temperature 98.9 F (37.2 C), temperature source Oral, resp. rate 30, last menstrual period 09/06/2011, SpO2 97.00%. Physical Exam vital signs stable afebrile no acute distress except without obvious adenopathy lungs are clear heart regular rate and rhythm abdomen is soft nontender no chest pain on palpation labs and x-ray reviewed Assessment/Plan: Achalasia with recurrent food impaction Plan: The risks benefits and methods of endoscopy and food disimpaction and repeat Botox injection was discussed and will proceed ASAP and we also discussed long-term care should probably include repeat surgery   [name] 10/08/2011, 11:14 AM

## 2011-10-16 NOTE — Interval H&P Note (Signed)
History and Physical Interval Note:  10/16/2011 3:10 PM  [name]  has presented today for surgery, with the diagnosis of Food Impaction    She is about one week status post endoscopic disimpaction by my partner, Dr. Vida Rigger, but has had continued symptoms similar to those when she was seen last week, although not as severe. These include chest pain including pleuritic right inframammary pain, discomfort on breathing, some shortness of breath, and pain with movement. Her dysphagia symptoms are severe and she has been subsisting mostly on liquids, minimal amounts of solids.   A chest CT 4 days ago showed a distended and fluid-filled esophagus. Discussion with the radiologist today indicates no evidence of pulmonary embolism on that study, which was quite a good study on the angiographic phase.  Endoscopic reevaluation has been requested for symptom relief, while waiting for the Botox injections from a week ago to hopefully kick in.   The various methods of treatment have been discussed with the patient. After consideration of risks, benefits and other options for treatment, the patient has consented to  Procedure(s): ESOPHAGOGASTRODUODENOSCOPY (EGD) FOREIGN BODY REMOVAL .  The patients' history has been reviewed, patient examined, stable for endoscopy.  I have reviewed the patients' chart and labs.  Questions were answered to the patient's satisfaction.     Florencia Reasons

## 2011-10-16 NOTE — Op Note (Signed)
[facility] [address] [place], [place]  [address]  ENDOSCOPY PROCEDURE REPORT  PATIENT:  [name], [name]  MR#:  [id number] BIRTHDATE:  1971-01-05, 40 yrs. old  GENDER:  female  ENDOSCOPIST:  Bernette Redbird, MD Referred by:  Luretha Murphy, M.D.  PROCEDURE DATE:  10/16/2011 PROCEDURE:  EGD with foreign body removal ASA CLASS: INDICATIONS:     Achalaia with chest pain and dilated food-filled on CT of esophagus.  MEDICATIONS:   Fentanyl 100 mcg IV, Versed 10 mg IV, Benadryl 50 mg IV TOPICAL ANESTHETIC:  Cetacaine Spray  DESCRIPTION OF PROCEDURE:   The patient came as an outpatient to the [facility] endoscopy Center. She was in no evident distress and her exam was unremarkable. After the risks and benefits of the procedure were explained, informed consent was obtained.  The Pentax Gastroscope [id number] endoscope was introduced through the mouth and advanced to the second portion of the duodenum.  The instrument was slowly withdrawn as the mucosa was fully examined. <<PROCEDUREIMAGES>> The esophagus was entered without undue difficulty. The patient retched up a small amount of food debris, without evidence of aspiration.  It was immediately noted that the esophagus was quite dilated and contained a large amount, probably several ounces, of food debris including vegetable material. This was not able to be suctioned through the scope with standard suction, so we connected the BioVac device which was quite successful in suctioning a large amount of this debris. We then made several passes with the [facility] retrieval basket, obtaining small amounts of food debris each time. We then went down again with the BioVac attachment and were successful in suctioning out the remainder of the food.  A small amount of food debris was noted in the stomach and a lot of this was suctioned out as well. The scope was then removed from the patient, at which time I felt it would be appropriate  to do a complete upper endoscopy so we re endoscoped the patient, and I performed a retroflex view of the cardia of the stomach which was unremarkable, specifically no evidence of gastric tumor to lead to secondary achalasia. The remainder of the stomach was unremarkable, as was the pylorus, duodenal bulb, and second duodenum. The scope was then withdrawn from the patient and the procedure completed.  COMPLICATIONS:  None  ENDOSCOPIC IMPRESSION:  RECOMMENDATIONS:  ______________________________ Bernette Redbird, MD  CC:  n. eSIGNED:   Evalise Abruzzese at 10/16/2011 04:23 PM  Leamon Arnt, [phone]

## 2011-10-17 ENCOUNTER — Encounter (HOSPITAL_COMMUNITY): Payer: Self-pay | Admitting: Gastroenterology

## 2011-10-18 ENCOUNTER — Encounter (HOSPITAL_COMMUNITY): Payer: Self-pay | Admitting: *Deleted

## 2011-10-18 ENCOUNTER — Inpatient Hospital Stay (HOSPITAL_COMMUNITY)
Admission: EM | Admit: 2011-10-18 | Discharge: 2011-10-23 | DRG: 871 | Disposition: A | Discharge status: Home / Self Care | Payer: PRIVATE HEALTH INSURANCE | Attending: Family Medicine | Admitting: Family Medicine

## 2011-10-18 ENCOUNTER — Other Ambulatory Visit: Payer: Self-pay

## 2011-10-18 ENCOUNTER — Telehealth (INDEPENDENT_AMBULATORY_CARE_PROVIDER_SITE_OTHER): Payer: Self-pay | Admitting: General Surgery

## 2011-10-18 ENCOUNTER — Emergency Department (HOSPITAL_COMMUNITY): Payer: PRIVATE HEALTH INSURANCE

## 2011-10-18 DIAGNOSIS — A419 Sepsis, unspecified organism: Principal | ICD-10-CM | POA: Diagnosis present

## 2011-10-18 DIAGNOSIS — K219 Gastro-esophageal reflux disease without esophagitis: Secondary | ICD-10-CM | POA: Diagnosis present

## 2011-10-18 DIAGNOSIS — D75839 Thrombocytosis, unspecified: Secondary | ICD-10-CM | POA: Diagnosis present

## 2011-10-18 DIAGNOSIS — R071 Chest pain on breathing: Secondary | ICD-10-CM | POA: Diagnosis present

## 2011-10-18 DIAGNOSIS — D72829 Elevated white blood cell count, unspecified: Secondary | ICD-10-CM | POA: Diagnosis present

## 2011-10-18 DIAGNOSIS — R509 Fever, unspecified: Secondary | ICD-10-CM | POA: Diagnosis present

## 2011-10-18 DIAGNOSIS — K220 Achalasia of cardia: Secondary | ICD-10-CM | POA: Diagnosis present

## 2011-10-18 DIAGNOSIS — D509 Iron deficiency anemia, unspecified: Secondary | ICD-10-CM | POA: Diagnosis present

## 2011-10-18 DIAGNOSIS — I319 Disease of pericardium, unspecified: Secondary | ICD-10-CM | POA: Diagnosis present

## 2011-10-18 DIAGNOSIS — I498 Other specified cardiac arrhythmias: Secondary | ICD-10-CM | POA: Diagnosis present

## 2011-10-18 DIAGNOSIS — R599 Enlarged lymph nodes, unspecified: Secondary | ICD-10-CM | POA: Diagnosis present

## 2011-10-18 DIAGNOSIS — J690 Pneumonitis due to inhalation of food and vomit: Secondary | ICD-10-CM | POA: Diagnosis present

## 2011-10-18 DIAGNOSIS — J9819 Other pulmonary collapse: Secondary | ICD-10-CM | POA: Diagnosis present

## 2011-10-18 DIAGNOSIS — R6510 Systemic inflammatory response syndrome (SIRS) of non-infectious origin without acute organ dysfunction: Secondary | ICD-10-CM | POA: Diagnosis present

## 2011-10-18 DIAGNOSIS — J90 Pleural effusion, not elsewhere classified: Secondary | ICD-10-CM | POA: Diagnosis present

## 2011-10-18 DIAGNOSIS — D473 Essential (hemorrhagic) thrombocythemia: Secondary | ICD-10-CM | POA: Diagnosis present

## 2011-10-18 DIAGNOSIS — R000 Tachycardia, unspecified: Secondary | ICD-10-CM | POA: Diagnosis present

## 2011-10-18 DIAGNOSIS — I3139 Other pericardial effusion (noninflammatory): Secondary | ICD-10-CM | POA: Diagnosis present

## 2011-10-18 DIAGNOSIS — I313 Pericardial effusion (noninflammatory): Secondary | ICD-10-CM | POA: Diagnosis present

## 2011-10-18 DIAGNOSIS — J9811 Atelectasis: Secondary | ICD-10-CM | POA: Diagnosis present

## 2011-10-18 LAB — BASIC METABOLIC PANEL
BUN: 12 mg/dL (ref 6–23)
CO2: 25 mEq/L (ref 19–32)
Calcium: 9.5 mg/dL (ref 8.4–10.5)
Chloride: 100 mEq/L (ref 96–112)
Creatinine, Ser: 0.8 mg/dL (ref 0.50–1.10)
GFR calc Af Amer: >90 mL/min (ref >90)
GFR calc non Af Amer: >90 mL/min (ref >90)
Glucose, Bld: 97 mg/dL (ref 70–99)
Potassium: 3.6 mEq/L (ref 3.5–5.1)
Sodium: 136 mEq/L (ref 135–145)

## 2011-10-18 LAB — CBC
HCT: 27.6 % — ABNORMAL LOW (ref 36.0–46.0)
Hemoglobin: 9.2 g/dL — ABNORMAL LOW (ref 12.0–15.0)
MCH: 21.1 pg — ABNORMAL LOW (ref 26.0–34.0)
MCHC: 33.3 g/dL (ref 30.0–36.0)
MCV: 63.2 fL — ABNORMAL LOW (ref 78.0–100.0)
Platelets: 508 10*3/uL — ABNORMAL HIGH (ref 150–400)
RBC: 4.37 MIL/uL (ref 3.87–5.11)
RDW: 15.1 % (ref 11.5–15.5)
WBC: 20.6 10*3/uL — ABNORMAL HIGH (ref 4.0–10.5)

## 2011-10-18 LAB — URINE MICROSCOPIC-ADD ON

## 2011-10-18 LAB — DIFFERENTIAL
Basophils Absolute: 0 10*3/uL (ref 0.0–0.1)
Basophils Relative: 0 % (ref 0–1)
Eosinophils Absolute: 0.2 10*3/uL (ref 0.0–0.7)
Eosinophils Relative: 1 % (ref 0–5)
Lymphocytes Relative: 14 % (ref 12–46)
Lymphs Abs: 2.9 10*3/uL (ref 0.7–4.0)
Monocytes Absolute: 1.6 10*3/uL — ABNORMAL HIGH (ref 0.1–1.0)
Monocytes Relative: 8 % (ref 3–12)
Neutro Abs: 15.9 10*3/uL — ABNORMAL HIGH (ref 1.7–7.7)
Neutrophils Relative %: 77 % (ref 43–77)

## 2011-10-18 LAB — URINALYSIS, ROUTINE W REFLEX MICROSCOPIC
Bilirubin Urine: NEGATIVE
Glucose, UA: NEGATIVE mg/dL
Ketones, ur: NEGATIVE mg/dL
Leukocytes, UA: NEGATIVE
Nitrite: NEGATIVE
Protein, ur: NEGATIVE mg/dL
Specific Gravity, Urine: 1.015 (ref 1.005–1.030)
Urobilinogen, UA: 1 mg/dL (ref 0.0–1.0)
pH: 6 (ref 5.0–8.0)

## 2011-10-18 LAB — POCT PREGNANCY, URINE: Preg Test, Ur: NEGATIVE

## 2011-10-18 MED ORDER — ONDANSETRON HCL 4 MG PO TABS: 4.0000 mg | ORAL_TABLET | Freq: Four times a day (QID) | ORAL | Status: DC | PRN

## 2011-10-18 MED ORDER — FENTANYL CITRATE 0.05 MG/ML IJ SOLN
12.5000 ug | Freq: Four times a day (QID) | INTRAMUSCULAR | Status: DC | PRN
  Administered 2011-10-20: 12.5 ug via INTRAVENOUS
  Filled 2011-10-18: qty 2

## 2011-10-18 MED ORDER — VANCOMYCIN HCL IN DEXTROSE 1-5 GM/200ML-% IV SOLN
1000.0000 mg | Freq: Three times a day (TID) | INTRAVENOUS | Status: DC
  Administered 2011-10-18 – 2011-10-21 (×9): 1000 mg via INTRAVENOUS
  Filled 2011-10-18 (×11): qty 200

## 2011-10-18 MED ORDER — IOHEXOL 300 MG/ML  SOLN
100.0000 mL | Freq: Once | INTRAMUSCULAR | Status: AC | PRN
  Administered 2011-10-18: 75 mL via INTRAVENOUS

## 2011-10-18 MED ORDER — SODIUM CHLORIDE 0.9 % IJ SOLN
3.0000 mL | Freq: Two times a day (BID) | INTRAMUSCULAR | Status: DC
  Administered 2011-10-18 – 2011-10-23 (×6): 3 mL via INTRAVENOUS

## 2011-10-18 MED ORDER — SODIUM CHLORIDE 0.9 % IV SOLN: INTRAVENOUS | Status: DC

## 2011-10-18 MED ORDER — ACETAMINOPHEN 650 MG RE SUPP: 650.0000 mg | Freq: Four times a day (QID) | RECTAL | Status: DC | PRN

## 2011-10-18 MED ORDER — PIPERACILLIN-TAZOBACTAM 3.375 G IVPB 30 MIN
3.3750 g | Freq: Once | INTRAVENOUS | Status: AC
  Administered 2011-10-18: 3.375 g via INTRAVENOUS
  Filled 2011-10-18: qty 50

## 2011-10-18 MED ORDER — ALBUTEROL SULFATE (5 MG/ML) 0.5% IN NEBU: 2.5000 mg | INHALATION_SOLUTION | Freq: Four times a day (QID) | RESPIRATORY_TRACT | Status: DC | PRN

## 2011-10-18 MED ORDER — SODIUM CHLORIDE 0.9 % IV SOLN
INTRAVENOUS | Status: DC
  Administered 2011-10-18 – 2011-10-19 (×2): via INTRAVENOUS

## 2011-10-18 MED ORDER — ONDANSETRON HCL 4 MG/2ML IJ SOLN: 4.0000 mg | Freq: Four times a day (QID) | INTRAMUSCULAR | Status: DC | PRN

## 2011-10-18 MED ORDER — PIPERACILLIN-TAZOBACTAM 3.375 G IVPB
3.3750 g | Freq: Three times a day (TID) | INTRAVENOUS | Status: DC
  Administered 2011-10-19 – 2011-10-22 (×10): 3.375 g via INTRAVENOUS
  Filled 2011-10-18 (×13): qty 50

## 2011-10-18 MED ORDER — ACETAMINOPHEN 325 MG PO TABS: 650.0000 mg | ORAL_TABLET | Freq: Four times a day (QID) | ORAL | Status: DC | PRN

## 2011-10-18 NOTE — Progress Notes (Signed)
ANTIBIOTIC CONSULT NOTE - INITIAL  Pharmacy Consult for Vancomycin and Zosyn  Indication: rule out pneumonia  No Known Allergies  Patient Measurements: Height: 5\' 6"  (167.6 cm) Weight: 202 lb 9.6 oz (91.9 kg) IBW/kg (Calculated) : 59.3  Adjusted Body Weight:   Vital Signs: Temp: 98.1 F (36.7 C) (02/07 2200) Temp src: Oral (02/07 2200) BP: 118/76 mmHg (02/07 2200) Pulse Rate: 83  (02/07 2200) Intake/Output from previous day:   Intake/Output from this shift:    Labs:  Basename 10/18/11 1830  WBC 20.6*  HGB 9.2*  PLT 508*  LABCREA --  CREATININE 0.80   Estimated Creatinine Clearance: 105.6 ml/min (by C-G formula based on Cr of 0.8). No results found for this basename: VANCOTROUGH:2,VANCOPEAK:2,VANCORANDOM:2,GENTTROUGH:2,GENTPEAK:2,GENTRANDOM:2,TOBRATROUGH:2,TOBRAPEAK:2,TOBRARND:2,AMIKACINPEAK:2,AMIKACINTROU:2,AMIKACIN:2, in the last 72 hours   Microbiology: No results found for this or any previous visit (from the past 720 hour(s)).  Medical History: Past Medical History  Diagnosis Date  . GERD (gastroesophageal reflux disease)   . Heart murmur   . Achalasia     S/p Heller myotomy 1991 and numerous dilations    Medications:  Anti-infectives     Start     Dose/Rate Route Frequency Ordered Stop   10/19/11 0600   piperacillin-tazobactam (ZOSYN) IVPB 3.375 g        3.375 g 12.5 mL/hr over 240 Minutes Intravenous 3 times per day 10/18/11 2215     10/18/11 2230   vancomycin (VANCOCIN) IVPB 1000 mg/200 mL premix        1,000 mg 200 mL/hr over 60 Minutes Intravenous 3 times per day 10/18/11 2215     10/18/11 2215   piperacillin-tazobactam (ZOSYN) IVPB 3.375 g        3.375 g 100 mL/hr over 30 Minutes Intravenous  Once 10/18/11 2215           Assessment: Patient with R/O PNA.  Good renal function  Goal of Therapy:  Vancomycin trough level 15-20 mcg/ml Zosyn based on renal function   Plan:  Measure antibiotic drug levels at steady state Follow up culture  results Vancomycin 1gm iv q8hr Zosyn 3.375g IV Q8H infused over 4hrs.   Darlina Guys, Jacquenette Shone Crowford 10/18/2011,10:17 PM

## 2011-10-18 NOTE — ED Provider Notes (Signed)
History     CSN: [id number]  Arrival date & time 10/18/11  1624   First MD Initiated Contact with Patient 10/18/11 1729      Chief Complaint  Patient presents with  . Shortness of Breath    (Consider location/radiation/quality/duration/timing/severity/associated sxs/prior treatment) HPI Comments: Patient with history of achalasia, s/p Heller myotomy, 3 EGD's with botox injections in past several months including 2 in past 2 weeks. She reports SOB for past week and chest pain that has now resolved. She complains of greatly reduced exercise tolerance. She is not short of breath at rest or with lying flat. Patient had a CT scan performed of her chest on 10/12/2011 which showed pericardial effusion, left pleural effusion, and left lung atelectasis. There is not felt to be any pulmonary embolisms at that time. Patient has seen a surgeon in consult for another myotomy. She was sent to the emergency department after reporting that her shortness of breath is not improved. Patient denies chest pain, fever, lower extremity swelling, nausea or vomiting.  Patient is a 41 y.o. female presenting with shortness of breath. The history is provided by the patient.  Shortness of Breath  The current episode started more than 1 week ago. The onset was gradual. The problem has been gradually worsening. The problem is moderate. The symptoms are relieved by nothing. Associated symptoms include shortness of breath. Pertinent negatives include no chest pain, no orthopnea, no fever, no rhinorrhea, no sore throat and no cough.    Past Medical History  Diagnosis Date  . GERD (gastroesophageal reflux disease)   . Heart murmur   . Achalasia     S/p Heller myotomy 1991 and numerous dilations    Past Surgical History  Procedure Date  . Achalasia surgery 1991    transthoracic Heller myotomy in [place]  . Foreign body removal 10/08/2011    Procedure: FOREIGN BODY REMOVAL;  Surgeon: Petra Kuba, MD;  Location: [facility]  ENDOSCOPY;  Service: Endoscopy;  Laterality: N/A;  botox  needed /ja/magod  . Esophagogastroduodenoscopy 10/08/2011    Procedure: ESOPHAGOGASTRODUODENOSCOPY (EGD);  Surgeon: Petra Kuba, MD;  Location: [facility] ENDOSCOPY;  Service: Endoscopy;  Laterality: N/A;  . Esophagogastroduodenoscopy 10/16/2011    Procedure: ESOPHAGOGASTRODUODENOSCOPY (EGD);  Surgeon: Florencia Reasons, MD;  Location: [facility] ENDOSCOPY;  Service: Endoscopy;  Laterality: N/A;  . Foreign body removal 10/16/2011    Procedure: FOREIGN BODY REMOVAL;  Surgeon: Florencia Reasons, MD;  Location: [facility] ENDOSCOPY;  Service: Endoscopy;  Laterality: N/A;  . Cesarean section     x2    Family History  Problem Relation Age of Onset  . Cancer Paternal Grandmother     breast  . Hypertension Mother     History  Substance Use Topics  . Smoking status: Former Smoker -- .5 years    Types: Cigarettes    Quit date: 10/07/1990  . Smokeless tobacco: Never Used   Comment: Smoked for 2 months, nothing heavier  . Alcohol Use: No     Doesn't drink but very rarely    OB History    Grav Para Term Preterm Abortions TAB SAB Ect Mult Living                  Review of Systems  Constitutional: Negative for fever.  HENT: Negative for sore throat and rhinorrhea.   Eyes: Negative for redness.  Respiratory: Positive for shortness of breath. Negative for cough.   Cardiovascular: Negative for chest pain and orthopnea.  Gastrointestinal: Negative for nausea,  vomiting, abdominal pain and diarrhea.  Genitourinary: Negative for dysuria.  Musculoskeletal: Negative for myalgias.  Skin: Negative for rash.  Neurological: Negative for headaches.    Allergies  Review of patient's allergies indicates no known allergies.  Home Medications  No current outpatient prescriptions on file.  BP 116/71  Pulse 100  Temp(Src) 98.6 F (37 C) (Oral)  Resp 24  Ht 5\' 6"  (1.676 m)  Wt 180 lb (81.647 kg)  BMI 29.05 kg/m2  SpO2 99%  LMP 10/18/2011  Physical Exam    Nursing note and vitals reviewed. Constitutional: She is oriented to person, place, and time. She appears well-developed and well-nourished.  HENT:  Head: Normocephalic and atraumatic.  Eyes: Conjunctivae are normal. Right eye exhibits no discharge. Left eye exhibits no discharge.  Neck: Normal range of motion. Neck supple.  Cardiovascular: Normal rate, regular rhythm and normal heart sounds.   Pulmonary/Chest: Effort normal and breath sounds normal.  Abdominal: Soft. There is no tenderness.  Musculoskeletal: She exhibits no edema and no tenderness.  Neurological: She is alert and oriented to person, place, and time.  Skin: Skin is warm and dry.  Psychiatric: She has a normal mood and affect.    ED Course  Procedures (including critical care time)  Labs Reviewed  CBC - Abnormal; Notable for the following:    WBC 20.6 (*)    Hemoglobin 9.2 (*)    HCT 27.6 (*)    MCV 63.2 (*)    MCH 21.1 (*)    Platelets 508 (*)    All other components within normal limits  DIFFERENTIAL - Abnormal; Notable for the following:    Neutro Abs 15.9 (*)    Monocytes Absolute 1.6 (*)    All other components within normal limits  BASIC METABOLIC PANEL  POCT PREGNANCY, URINE   Ct Angio Chest W/cm &/or Wo Cm  10/18/2011  *RADIOLOGY REPORT*  Clinical Data: Short of breath  CT ANGIOGRAPHY CHEST  Technique:  Multidetector CT imaging of the chest using the standard protocol during bolus administration of intravenous contrast. Multiplanar reconstructed images including MIPs were obtained and reviewed to evaluate the vascular anatomy.  Contrast: 75mL OMNIPAQUE IOHEXOL 300 MG/ML IV SOLN  Comparison: 10/12/2011  Findings: Respiratory motion limits the study.  No obvious filling defects in the pulmonary arterial tree.  Pericardial effusion minimally worse.  Moderate left pleural effusion is worse.  Left lower lobe collapse is worse.  The esophagus remains markedly dilated containing fluid contents and air.  This is  not significantly changed.  Mediastinal stranding and adenopathy is worse.  Prevascular 11 mm short axis diameter node on image 34.  Other adjacent smaller nodes.  No pneumothorax.  Otherwise stable exam.  IMPRESSION: Compared the prior study, the pericardial effusion, left pleural effusion, left lower lobe volume loss, and mediastinal adenopathy with stranding are all worse.  These findings suggest a worsening inflammatory process.  The no evidence of acute pulmonary thromboembolism.  Original Report Authenticated By: Donavan Burnet, M.D.     1. Pericardial effusion    Patient was seen and examined. Case was discussed with Dr. Freida Busman. Chest CT was ordered and reviewed by myself. Patient seen by Dr. Freida Busman. Patient admitted for worsening shortness of breath and worsening pericardial effusion.   MDM  Admit for worsening shortness of breath and pericardial effusion.        Eustace Moore [place], [place] 10/18/11 2122

## 2011-10-18 NOTE — H&P (Signed)
PCP:  Provider Not In System  Charlott Rakes with [facility]  Chief Complaint:  Fevers, chest pain, SOB  HPI: 40yoF with h/o achalasia s/p Heller myotomy 1991 and multiple dilations, otherwise quite healthy, presents with fevers, pleuritic chest pain, and SOB and found to have LLL lung collapse, moderate left pleural effusion, pericardial effusion, mediastinal adenopathy, WBC 20 and worsened anemia.   This patient first presented to [facility] in 05/2011 when she was admitted for upper GI obstruction  for achalasia, which had been diagnosed in [place] in 1988 and for which she had had multiple dilations. She had  EGD with botox injxns done by GI. Noted to be anemic, thought likely IDA. She followed up with GI as outpt in  06/2011. She was then seen again in ED 10/08/2011 for similar presentation after having seen a surgeon re: redo  achalasia surgery, and they apparently did redo EGD/Botox injection that day. Then, on 10/12/2011, Dr. Ermalene Searing  note indicates pt started developing chest pain, pleurisy, and fevers, was tachy to 100. They sent her for  chest CT which showed distended fluid filled esophagus, however it also noted small pericardial and tiny left  pleural effusion, for which she was started on Augmentin. She had another EGD done on 2/5 with food removal.   She now presents because for the past 1-2 weeks, i.e. during the same time frame that she's had 2 EGD/botox injections, she feels chest pain with breathing or when she lays on her side, and feels she can't catch her breath. She notes a fever to 102 at the clinic the other day. She contacted outpt GI office and was referred to ED.   In the ED, pt tachycardic to 112. Normal chemistry panel. WBC 20.6 with normal diff but hypersegmented  neutrophils noted; Hct was 27.6 which is down from 35.3 at end of Jan, MCV is 63, and plts are 508. CTA chest  showed pericardial effusion that was minimally worse, moderate left pleural effusion  that was worse, left  lower lobe collapse that was worse, and a markedly dilated esophagus with fluid contents and air, but not  significantly changed; also with mediastinal adenopathy and stranding that are worse.  Other than what's happening recently, she was in perfectly good health, hasn't felt sick in years, doesn't even get colds, and denies any fevers, chills, sweats, malaise, feeling systemically ill, SOB, joint pains, mouth ulcerations, rashes, changes to fingers or toes, abdominal pain, diarrhea constipation, dysuria, or problems urinating. Endorses some minimal light sensitivity for the past 3-4 weeks for which she's had to turn down the lights in the house. Has had some vomiting for past few days. ROS o/w negative.   Past Medical History  Diagnosis Date  . GERD (gastroesophageal reflux disease)   . Heart murmur   . Achalasia     S/p Heller myotomy 1991 and numerous dilations    Past Surgical History  Procedure Date  . Achalasia surgery 1991    transthoracic Heller myotomy in [place]  . Foreign body removal 10/08/2011    Procedure: FOREIGN BODY REMOVAL;  Surgeon: Petra Kuba, MD;  Location: [facility] ENDOSCOPY;  Service: Endoscopy;  Laterality: N/A;  botox  needed /ja/magod  . Esophagogastroduodenoscopy 10/08/2011    Procedure: ESOPHAGOGASTRODUODENOSCOPY (EGD);  Surgeon: Petra Kuba, MD;  Location: [facility] ENDOSCOPY;  Service: Endoscopy;  Laterality: N/A;  . Esophagogastroduodenoscopy 10/16/2011    Procedure: ESOPHAGOGASTRODUODENOSCOPY (EGD);  Surgeon: Florencia Reasons, MD;  Location: [facility] ENDOSCOPY;  Service: Endoscopy;  Laterality: N/A;  . Foreign body removal 10/16/2011    Procedure: FOREIGN BODY REMOVAL;  Surgeon: Florencia Reasons, MD;  Location: [facility] ENDOSCOPY;  Service: Endoscopy;  Laterality: N/A;  . Cesarean section     x2    Medications:  HOME MEDS: Doesn't take any daily meds  Prior to Admission medications   Not on File    Allergies:  No Known Allergies  Social History:    reports that she quit smoking about 21 years ago. Her smoking use included Cigarettes. She quit after .5 years of use. She has never used smokeless tobacco. She reports that she does not drink alcohol or use illicit drugs.  Lives at home with husband, working and goes to school, has 2 kids. Very minimal smoking history, very rare alcohol.   Family History: Family History  Problem Relation Age of Onset  . Cancer Paternal Grandmother     breast  . Hypertension Mother    Physical Exam: Filed Vitals:   10/18/11 1642 10/18/11 1659 10/18/11 2007  BP: 117/72  116/71  Pulse: 112  100  Temp: 98.6 F (37 C)    TempSrc: Oral    Resp: 18  24  Height:  5\' 6"  (1.676 m)   Weight:  81.647 kg (180 lb)   SpO2: 96%  99%   Blood pressure 116/71, pulse 100, temperature 98.6 F (37 C), temperature source Oral, resp. rate 24, height 5\' 6"  (1.676 m), weight 81.647 kg (180 lb), last menstrual period 10/18/2011, SpO2 99.00%.  Gen: Average sized F in no distress, able to relate history well and is pleasant, gets tearful during conversation, but breathing comfortably HEENT: Pupils round, reactive, normal appearing, EOMI, sclera clear, mouth moist and normal without lesions Lungs: CTAB no w/c/r, good air movement, normal exam. No decreased breath sounds or other adventitious lung sounds at the bases Heart: minimal tachycardia, but no m/g, normal exam Abd: Soft, NT ND, benign exam, no facial grimacing Extrem: Warm, well perfused, radials palpable, normal exam, normal bulk Neuro: Alert, attentive, CN 2-12 intact, moves extremities, sits up in bed on her own, grossly nonfocal.    Labs & Imaging Results for orders placed during the hospital encounter of 10/18/11 (from the past 48 hour(s))  CBC     Status: Abnormal   Collection Time   10/18/11  6:30 PM      Component Value Range Comment   WBC 20.6 (*) 4.0 - 10.5 (K/uL)    RBC 4.37  3.87 - 5.11 (MIL/uL)    Hemoglobin 9.2 (*) 12.0 - 15.0 (g/dL)    HCT 95.6 (*)  21.3 - 46.0 (%)    MCV 63.2 (*) 78.0 - 100.0 (fL)    MCH 21.1 (*) 26.0 - 34.0 (pg)    MCHC 33.3  30.0 - 36.0 (g/dL)    RDW 08.6  57.8 - 46.9 (%)    Platelets 508 (*) 150 - 400 (K/uL)   DIFFERENTIAL     Status: Abnormal   Collection Time   10/18/11  6:30 PM      Component Value Range Comment   Neutrophils Relative 77  43 - 77 (%)    Lymphocytes Relative 14  12 - 46 (%)    Monocytes Relative 8  3 - 12 (%)    Eosinophils Relative 1  0 - 5 (%)    Basophils Relative 0  0 - 1 (%)    Neutro Abs 15.9 (*) 1.7 - 7.7 (K/uL)    Lymphs Abs 2.9  0.7 - 4.0 (K/uL)    Monocytes Absolute 1.6 (*) 0.1 - 1.0 (K/uL)    Eosinophils Absolute 0.2  0.0 - 0.7 (K/uL)    Basophils Absolute 0.0  0.0 - 0.1 (K/uL)    WBC Morphology HYPERSEGMENTED NEUT      Smear Review PLATELET COUNT CONFIRMED BY SMEAR     BASIC METABOLIC PANEL     Status: Normal   Collection Time   10/18/11  6:30 PM      Component Value Range Comment   Sodium 136  135 - 145 (mEq/L)    Potassium 3.6  3.5 - 5.1 (mEq/L)    Chloride 100  96 - 112 (mEq/L)    CO2 25  19 - 32 (mEq/L)    Glucose, Bld 97  70 - 99 (mg/dL)    BUN 12  6 - 23 (mg/dL)    Creatinine, Ser 1.61  0.50 - 1.10 (mg/dL)    Calcium 9.5  8.4 - 10.5 (mg/dL)    GFR calc non Af Amer >90  >90 (mL/min)    GFR calc Af Amer >90  >90 (mL/min)   POCT PREGNANCY, URINE     Status: Normal   Collection Time   10/18/11  7:10 PM      Component Value Range Comment   Preg Test, Ur NEGATIVE  NEGATIVE     Ct Angio Chest W/cm &/or Wo Cm  10/18/2011  *RADIOLOGY REPORT*  Clinical Data: Short of breath  CT ANGIOGRAPHY CHEST  Technique:  Multidetector CT imaging of the chest using the standard protocol during bolus administration of intravenous contrast. Multiplanar reconstructed images including MIPs were obtained and reviewed to evaluate the vascular anatomy.  Contrast: 75mL OMNIPAQUE IOHEXOL 300 MG/ML IV SOLN  Comparison: 10/12/2011  Findings: Respiratory motion limits the study.  No obvious filling  defects in the pulmonary arterial tree.  Pericardial effusion minimally worse.  Moderate left pleural effusion is worse.  Left lower lobe collapse is worse.  The esophagus remains markedly dilated containing fluid contents and air.  This is not significantly changed.  Mediastinal stranding and adenopathy is worse.  Prevascular 11 mm short axis diameter node on image 34.  Other adjacent smaller nodes.  No pneumothorax.  Otherwise stable exam.  IMPRESSION: Compared the prior study, the pericardial effusion, left pleural effusion, left lower lobe volume loss, and mediastinal adenopathy with stranding are all worse.  These findings suggest a worsening inflammatory process.  The no evidence of acute pulmonary thromboembolism.  Original Report Authenticated By: Donavan Burnet, M.D.   ECG: sinus tachycardia with normal axis, normal P waves, narrow QRS, no ST segment deviations, anterior and lateral flat TW's, overall not ischemic appearing.   Impression Present on Admission:  .Achalasia .Leukocytosis .Thrombocytosis .Pericardial effusion .Pleural effusion, left .Collapse of left lung .Microcytic anemia .Tachycardia .SIRS (systemic inflammatory response syndrome)  40yoF with h/o achalasia s/p Heller myotomy 1991 and multiple dilations, otherwise quite healthy, presents with fevers, pleuritic chest pain, and SOB and found to have LLL lung collapse, moderate left pleural effusion, pericardial effusion, mediastinal adenopathy, WBC 20 and worsened anemia.   1. Leukoctysosis, thrombocytosis with pericardial effusion, left pleural effusion, LLL lung collapse: Overall, I suspect this is most likely due to the recent worsening of her achalasia and the recent procedures she's had done. DDx includes general mediastinitis either from procedural trauma vs inflammation of stagnant food contents. She probably also aspirated during the procedures and has aspiration PNA, food impaction and lung collapse, however that  wouldn't necessarily explain the pericardial effusion.   Given the effusions, considered a systemic inflammatory process like lupus or vasculitides, but pt was feeling totally well before all this, and nothing by history or exam supports these diagnoses.   - ABx for pulmonary process: Vanc/Zosyn and get UA/UCx, BCx's, sputum culture. Clear liquid diet  - ANA, although likely low yield  - Consider pulmonary, cardiology consultations. Would also let [facility] know she has been admitted.  - Echo for pericardial effusion/tachycardia  2. Microcytic anemia: Hct has now dropped over the past week, without overt blood loss. Suspect probably due to acute inflammatory process.  - Anemia panel, monitor for now but hold on transfusion - LDH, haptoglobin  3. Tachycardia: Sinus tachycardia without ischemic changes on ECG, and BP preserved, will monitor, admit to telemetry, give IVF's gently.   Telemetry, [facility] team 3  Presumed full code   Other plans as per orders.  Misa Fedorko 10/18/2011, 9:06 PM

## 2011-10-18 NOTE — ED Notes (Signed)
B/P 116/71  HR  102  R 20 Pulse Ox 99% on room air.Marland KitchenMarland KitchenResting on carrier watching TV---[place] given per pt's request.

## 2011-10-18 NOTE — Telephone Encounter (Signed)
Contacted the patient after receiving a voicemail stating she is still having Shortness of Breath and chest pain, sent pt to the ED for evaluation. Notified Dr Daphine Deutscher.

## 2011-10-18 NOTE — ED Provider Notes (Signed)
Medical screening examination/treatment/procedure(s) were conducted as a shared visit with non-physician practitioner(s) and myself.  I personally evaluated the patient during the encounter  Patient's labs and x-rays reviewed. Spoke with hospitalist the patient will be admitted  Toy Baker, MD 10/18/11 1950

## 2011-10-18 NOTE — ED Notes (Signed)
Pt states "had a procedure done @ [facility], the procedure was to remove the fluid from my esophagus, they sent me bk to [facility] on Fri for CT & was told I had fluid around my heart, was given augmentin, then on Tues, had the same pressure & pain again so they did the procedure again, I called them today & told them I still am not breathing normally when I walk, so they sent me here for another CT"

## 2011-10-18 NOTE — Plan of Care (Signed)
Problem: Consults Goal: General Medical Patient Education See Patient Education Module for specific education. Outcome: Completed/Met Date Met:  10/18/11 Achalasia, Pericardial effusion, Pleural effusion  Problem: Phase I Progression Outcomes Goal: Initial discharge plan identified Outcome: Completed/Met Date Met:  10/18/11 Pt plans to return home

## 2011-10-19 DIAGNOSIS — J90 Pleural effusion, not elsewhere classified: Secondary | ICD-10-CM

## 2011-10-19 DIAGNOSIS — K220 Achalasia of cardia: Secondary | ICD-10-CM

## 2011-10-19 DIAGNOSIS — I319 Disease of pericardium, unspecified: Secondary | ICD-10-CM

## 2011-10-19 DIAGNOSIS — J690 Pneumonitis due to inhalation of food and vomit: Secondary | ICD-10-CM

## 2011-10-19 LAB — BASIC METABOLIC PANEL
BUN: 10 mg/dL (ref 6–23)
CO2: 24 mEq/L (ref 19–32)
Calcium: 8.9 mg/dL (ref 8.4–10.5)
Chloride: 102 mEq/L (ref 96–112)
Creatinine, Ser: 0.8 mg/dL (ref 0.50–1.10)
GFR calc Af Amer: >90 mL/min (ref >90)
GFR calc non Af Amer: >90 mL/min (ref >90)
Glucose, Bld: 108 mg/dL — ABNORMAL HIGH (ref 70–99)
Potassium: 3.9 mEq/L (ref 3.5–5.1)
Sodium: 136 mEq/L (ref 135–145)

## 2011-10-19 LAB — FERRITIN: Ferritin: 1113 ng/mL — ABNORMAL HIGH (ref 10–291)

## 2011-10-19 LAB — CBC
HCT: 26.9 % — ABNORMAL LOW (ref 36.0–46.0)
Hemoglobin: 9.1 g/dL — ABNORMAL LOW (ref 12.0–15.0)
MCH: 21.3 pg — ABNORMAL LOW (ref 26.0–34.0)
MCHC: 33.8 g/dL (ref 30.0–36.0)
MCV: 63 fL — ABNORMAL LOW (ref 78.0–100.0)
Platelets: 448 10*3/uL — ABNORMAL HIGH (ref 150–400)
RBC: 4.27 MIL/uL (ref 3.87–5.11)
RDW: 15 % (ref 11.5–15.5)
WBC: 15.6 10*3/uL — ABNORMAL HIGH (ref 4.0–10.5)

## 2011-10-19 LAB — IRON AND TIBC
Iron: 18 ug/dL — ABNORMAL LOW (ref 42–135)
Saturation Ratios: 11 % — ABNORMAL LOW (ref 20–55)
TIBC: 170 ug/dL — ABNORMAL LOW (ref 250–470)
UIBC: 152 ug/dL (ref 125–400)

## 2011-10-19 LAB — RETICULOCYTES
RBC.: 4.27 MIL/uL (ref 3.87–5.11)
Retic Count, Absolute: 55.5 10*3/uL (ref 19.0–186.0)
Retic Ct Pct: 1.3 % (ref 0.4–3.1)

## 2011-10-19 LAB — FOLATE: Folate: 16.5 ng/mL

## 2011-10-19 LAB — LACTATE DEHYDROGENASE: LDH: 130 U/L (ref 94–250)

## 2011-10-19 LAB — VITAMIN B12: Vitamin B-12: 522 pg/mL (ref 211–911)

## 2011-10-19 MED ORDER — LORAZEPAM 0.5 MG PO TABS: 0.5000 mg | ORAL_TABLET | Freq: Once | ORAL | Status: DC

## 2011-10-19 MED ORDER — LORAZEPAM 2 MG/ML IJ SOLN
0.5000 mg | Freq: Four times a day (QID) | INTRAMUSCULAR | Status: DC | PRN
  Administered 2011-10-19 – 2011-10-21 (×2): 0.5 mg via INTRAVENOUS
  Filled 2011-10-19 (×3): qty 1

## 2011-10-19 MED ORDER — LORAZEPAM 2 MG/ML IJ SOLN
INTRAMUSCULAR | Status: AC
  Administered 2011-10-19: 0.5 mg
  Filled 2011-10-19: qty 1

## 2011-10-19 MED ORDER — ACETAMINOPHEN 10 MG/ML IV SOLN
1000.0000 mg | Freq: Four times a day (QID) | INTRAVENOUS | Status: AC | PRN
  Administered 2011-10-19: 1000 mg via INTRAVENOUS
  Filled 2011-10-19 (×2): qty 100

## 2011-10-19 NOTE — Progress Notes (Signed)
  Echocardiogram 2D Echocardiogram has been performed.  [name] [name] 10/19/2011, 10:35 AM

## 2011-10-19 NOTE — Progress Notes (Signed)
PROGRESS NOTE  [name] YNW:[id number] DOB: Mar 19, 1971 DOA: 10/18/2011 PCP: Provider Not In System Gastroenterology: Charlott Rakes, M.D.  Brief narrative: 40yoF with h/o achalasia s/p Heller myotomy 1991 and multiple dilations, otherwise quite healthy, presents with fevers, pleuritic chest pain, and SOB and found to have LLL lung collapse, moderate left pleural effusion, pericardial effusion, mediastinal adenopathy, WBC 20 and worsened anemia.   She now presents because for the past 1-2 weeks, i.e. during the same time frame that she's had 2 EGD/botox injections, she feels chest pain with breathing or when she lays on her side, and feels she can't catch her breath. She notes a fever to 102 at the clinic the other day. She contacted outpt GI office and was referred to ED.   In the ED, pt tachycardic to 112. Normal chemistry panel. WBC 20.6 with normal diff but hypersegmented neutrophils noted; Hct was 27.6 which is down from 35.3 at end of Jan, MCV is 63, and plts are 508. CTA chest showed pericardial effusion that was minimally worse, moderate left pleural effusion that was worse, left lower lobe collapse that was worse, and a markedly dilated esophagus with fluid contents and air, but not significantly changed; also with mediastinal adenopathy and stranding that are worse.  Past medical history: Achalasia, GERD, heart murmur  Consultants:  Gastroenterology  Pulmonology  Procedures:  Prior to admission February 5: EGD with foreign body removal.  Prior to admission January 28: EGD with foreign body removal.  Antibiotics:  February 7: Zosyn  February 7: Vancomycin  Interim History: Chart reviewed in detail.  Subjective: Patient feels about the same. Still short of breath especially with ambulation.  Objective: Filed Vitals:   10/18/11 1659 10/18/11 2007 10/18/11 2200 10/19/11 0625  BP:  116/71 118/76 106/70  Pulse:  100 83 106  Temp:   98.1 F (36.7 C) 99 F (37.2 C)    TempSrc:   Oral Oral  Resp:  24 24 22   Height: 5\' 6"  (1.676 m)  5\' 6"  (1.676 m)   Weight: 81.647 kg (180 lb)  91.9 kg (202 lb 9.6 oz)   SpO2:  99% 99% 98%    Intake/Output Summary (Last 24 hours) at 10/19/11 1110 Last data filed at 10/19/11 0937  Gross per 24 hour  Intake 1104.67 ml  Output    450 ml  Net 654.67 ml    Exam:   General:  Appears calm mildly short of breath.  Respiratory: Decreased breath sounds on the left with minimal air movement posteriorly. No frank wheezes rales or rhonchi. Mild increased work of breathing. Clear to auscultation on the right.  Abdomen: Soft, nontender, nondistended.  Skin: Appears grossly unremarkable.  Data Reviewed: Basic Metabolic Panel:  Lab 10/19/11 6578 10/18/11 1830  NA 136 136  K 3.9 3.6  CL 102 100  CO2 24 25  GLUCOSE 108* 97  BUN 10 12  CREATININE 0.80 0.80  CALCIUM 8.9 9.5  MG -- --  PHOS -- --   CBC:  Lab 10/19/11 0550 10/18/11 1830  WBC 15.6* 20.6*  NEUTROABS -- 15.9*  HGB 9.1* 9.2*  HCT 26.9* 27.6*  MCV 63.0* 63.2*  PLT 448* 508*   Studies: Ct Angio Chest W/cm &/or Wo Cm  10/18/2011  *RADIOLOGY REPORT*  Clinical Data: Short of breath  CT ANGIOGRAPHY CHEST  Technique:  Multidetector CT imaging of the chest using the standard protocol during bolus administration of intravenous contrast. Multiplanar reconstructed images including MIPs were obtained and reviewed to evaluate  the vascular anatomy.  Contrast: 75mL OMNIPAQUE IOHEXOL 300 MG/ML IV SOLN  Comparison: 10/12/2011  Findings: Respiratory motion limits the study.  No obvious filling defects in the pulmonary arterial tree.  Pericardial effusion minimally worse.  Moderate left pleural effusion is worse.  Left lower lobe collapse is worse.  The esophagus remains markedly dilated containing fluid contents and air.  This is not significantly changed.  Mediastinal stranding and adenopathy is worse.  Prevascular 11 mm short axis diameter node on image 34.  Other  adjacent smaller nodes.  No pneumothorax.  Otherwise stable exam.  IMPRESSION: Compared the prior study, the pericardial effusion, left pleural effusion, left lower lobe volume loss, and mediastinal adenopathy with stranding are all worse.  These findings suggest a worsening inflammatory process.  The no evidence of acute pulmonary thromboembolism.  Original Report Authenticated By: Donavan Burnet, M.D.    Scheduled Meds:   . LORazepam      . LORazepam  0.5 mg Oral Once  . piperacillin-tazobactam  3.375 g Intravenous Once  . piperacillin-tazobactam (ZOSYN)  IV  3.375 g Intravenous Q8H  . sodium chloride  3 mL Intravenous Q12H  . vancomycin  1,000 mg Intravenous Q8H  . DISCONTD: sodium chloride   Intravenous STAT   Continuous Infusions:   . sodium chloride 100 mL/hr at 10/18/11 2322     Assessment/Plan: 1. Sepsis: Now afebrile. Respiratory rate decreased. Oxygenation stable. Leukocytosis improved. 2. Left lower lung collapse/pleural effusion/pericardial effusion: Differential includes aspiration pneumonia, mediastinitis, procedural trauma. Nontoxic. Continue empiric antibiotics. Followup cultures. Pulmonary consultation (Dr. Craige Cotta) and gastroenterology consultation (Dr. Matthias Hughs) placed. 3. Pericardial effusion: 2-D echocardiogram performed and results are pending. Will consider cardiology consult based on significance. 4. Microcytic anemia: Patient is unaware of this diagnosis. She is followed in the outpatient setting by gastroenterology. Would defer further evaluation to gastroenterology.  Code Status: Full code Family Communication: Discussed with family at bedside Disposition Plan: Pending further evaluation   Brendia Sacks, MD  [facility] Pager [phone] 10/19/2011, 11:10 AM    LOS: 1 day

## 2011-10-19 NOTE — Progress Notes (Signed)
   CARE MANAGEMENT NOTE 10/19/2011  Patient:  [name], [name]   Account Number:  [id number]  Date Initiated:  10/19/2011  Documentation initiated by:  [facility]  Subjective/Objective Assessment:   ADMITTED W/FEVER,CHEST PAIN,SOB.ZO:[id number].     Action/Plan:   FROM HOME W/SPOUSE.   Anticipated DC Date:  10/24/2011   Anticipated DC Plan:  HOME/SELF CARE         Choice offered to / List presented to:             Status of service:  In process, will continue to follow Medicare Important Message given?   (If response is "NO", the following Medicare IM given date fields will be blank) Date Medicare IM given:   Date Additional Medicare IM given:    Discharge Disposition:    Per UR Regulation:  Reviewed for med. necessity/level of care/duration of stay  Comments:  10/19/11 [facility] RN,BSN [phone]

## 2011-10-19 NOTE — Consult Note (Signed)
Name: [name] MRN: [id number] DOB: [birth year]    LOS: 1  [place] [facility]  Consulting MD Irene Limbo  Reason for consultation Effusions/dyspnea in setting of achalasia and recent esophageal botox injection and repeat endoscopies  History of Present Illness:  41 y.o. female with long-standing achalasia, status post previous Heller myotomy, admitted 2/7 with increaseddyspnea, orthopnea, pleuritis symptoms with some pain on deep breathing. These symptoms seem to have been more pronounced since she underwent endoscopy with Botox injection on January 28, which was performed because of severe dysphagia and food retention. She has been followed by GI and gen surgery in the out-pt setting w/ CT scan of the chest was obtained on January 31 which showed a small pleural effusion, minor atelectasis, and a small pericardial effusion. On February 5 she underwent repeat endoscopy by Buccini (She had apparently gotten substantial relief of her breathing after the endoscopy, Botox injection, and food disimpaction of the esophagus by Dr.Magod a week earlier.) On repeat endoscopy February 5, there was a large amount of food debris within the esophagus, which was able to be completely cleared out by a combination of suctioning and use of a International aid/development worker. There was no apparent injury to the esophageal mucosa to suggest esophageal perforation at that time. However, the patient was somewhat short of breath both before and after the procedure. She was placed on thin liquid fluid restrictions. On 2/7  a repeat chest CT was done which showed progression of her left-sided pleural effusion and some degree of progression of the pericardial effusion. Since admission, she has had a fever to 102, and an initial leukocytosis of 20,600, which has improved on antibiotic therapy (Zosyn) overnight to a level of 15,600. PCCM asked to evaluate left pleural effusion.  .    Past Medical History  Diagnosis Date  .  GERD (gastroesophageal reflux disease)   . Heart murmur   . Achalasia     S/p Heller myotomy 1991 and numerous dilations   Past Surgical History  Procedure Date  . Achalasia surgery 1991    transthoracic Heller myotomy in [place]  . Foreign body removal 10/08/2011    Procedure: FOREIGN BODY REMOVAL;  Surgeon: Petra Kuba, MD;  Location: [facility] ENDOSCOPY;  Service: Endoscopy;  Laterality: N/A;  botox  needed /ja/magod  . Esophagogastroduodenoscopy 10/08/2011    Procedure: ESOPHAGOGASTRODUODENOSCOPY (EGD);  Surgeon: Petra Kuba, MD;  Location: [facility] ENDOSCOPY;  Service: Endoscopy;  Laterality: N/A;  . Esophagogastroduodenoscopy 10/16/2011    Procedure: ESOPHAGOGASTRODUODENOSCOPY (EGD);  Surgeon: Florencia Reasons, MD;  Location: [facility] ENDOSCOPY;  Service: Endoscopy;  Laterality: N/A;  . Foreign body removal 10/16/2011    Procedure: FOREIGN BODY REMOVAL;  Surgeon: Florencia Reasons, MD;  Location: [facility] ENDOSCOPY;  Service: Endoscopy;  Laterality: N/A;  . Cesarean section     x2   Prior to Admission medications   Not on File   Allergies No Known Allergies  Family History Family History  Problem Relation Age of Onset  . Cancer Paternal Grandmother     breast  . Hypertension Mother     Social History  reports that she quit smoking about 21 years ago. Her smoking use included Cigarettes. She quit after .5 years of use. She has never used smokeless tobacco. She reports that she does not drink alcohol or use illicit drugs.  Review Of Systems  11 points review of systems is negative with an exception of listed in HPI. Constitutional: negative for anorexia, + fevers and sweats  Eyes: negative for irritation, redness and visual disturbance  Ears, nose, mouth, throat, and face: negative for earaches, epistaxis, nasal congestion and sore throat  Respiratory: negative for cough, dyspnea on exertion, sputum and wheezing  Cardiovascular: negative for chest pain, dyspnea, lower extremity edema, orthopnea,  palpitations and syncope  Gastrointestinal: negative for abdominal pain, constipation, diarrhea, melena, nausea and vomiting  Genitourinary:negative for dysuria, frequency and hematuria  Hematologic/lymphatic: negative for bleeding, easy bruising and lymphadenopathy  Musculoskeletal:negative for arthralgias, muscle weakness and stiff joints  Neurological: negative for coordination problems, gait problems, headaches and weakness  Endocrine: negative for diabetic symptoms including polydipsia, polyuria and weight loss   Vital Signs: Temp:  [98.1 F (36.7 C)-102.1 F (38.9 C)] 102.1 F (38.9 C) (02/08 1331) Pulse Rate:  [83-117] 117  (02/08 1331) Resp:  [18-24] 18  (02/08 1331) BP: (106-118)/(70-80) 118/80 mmHg (02/08 1331) SpO2:  [94 %-99 %] 94 % (02/08 1331) Weight:  [81.647 kg (180 lb)-91.9 kg (202 lb 9.6 oz)] 91.9 kg (202 lb 9.6 oz) (02/07 2200)      . sodium chloride 100 mL/hr at 10/18/11 2322   I/O last 3 completed shifts: In: 1101.7 [I.V.:651.7; IV Piggyback:450] Out: 450 [Urine:450]  Physical Examination: General:  No acute distress Neuro:  Alert and oriented HEENT:  No JVD or adenopathy Cardiovascular:  rrr Lungs:  Decreased in on left Abdomen:  Soft, non-tender Musculoskeletal:  2 + pulses       Labs and Imaging:   Lab 10/19/11 0550 10/18/11 1830  NA 136 136  K 3.9 3.6  CL 102 100  CO2 24 25  BUN 10 12  CREATININE 0.80 0.80  GLUCOSE 108* 97    Lab 10/19/11 0550 10/18/11 1830  HGB 9.1* 9.2*  HCT 26.9* 27.6*  WBC 15.6* 20.6*  PLT 448* 508*    Assessment and Plan: Pneumonia vs atelectasis  with left pleural effusion. Agree w/ primary team concern for aspiration pna, doubt possibility of recent esophageal perf (radiology felt CT chest was reassuring that per unlikely).  In setting of fever, and given history would feel compelled to evaluate pleural fluid to r/o empyema vs  para-pneumonic process.  recs -IR guided thoracentesis w/ full fluid analysis  incl amylase -cont IV antibiotics.  -discussed risks & benefits of procedure with pt  BABCOCK,PETE 10/19/2011, 3:45 PM  ALVA,RAKESH V.

## 2011-10-19 NOTE — Consult Note (Signed)
Referring Provider: Dr. Brendia Sacks ([facility]) Primary Care Physician:  Provider Not In System Primary Gastroenterologist:  Dr. Charlott Rakes  Reason for Consultation:  Achalasia, with pericardial and pleural effusions following recent endoscopic procedures  HPI: [name] is a 41 y.o. female with long-standing achalasia, status post previous Heller myotomy, admitted yesterday with increased pulmonary symptoms, including resting dyspnea, orthopnea, pleuritis symptoms with some pain on deep breathing, and also a cough when she lies down or tries to take a deep breath. These symptoms seem to have been more pronounced since she underwent endoscopy with Botox injection on January 28, which was performed because of severe dysphagia and food retention.   Of note, the patient was admitted to the hospital back in September because of achalasia symptoms, at which time a CT angiogram of the chest was negative for pulmonary emboli, implying some element of dysphagia symptoms at that time. The patient was lost to followup but resurfaced with a food impaction necessitating the above-mentioned urgent endoscopic procedure on January 28.  A couple of days thereafter, she was seen at the office of Dr. Wenda Low for consideration of a possible redo of her Heller myotomy. At that time, due to pleuritic symptoms, she was started on Augmentin. A CT scan of the chest was obtained on January 31 which showed a small pleural effusion, minor atelectasis, and a small pericardial effusion.  Several days thereafter, on February 5, I did a repeat endoscopy on the patient due to chest discomfort and breathing difficulties, with the thought that her dilated esophagus was causing compression of the lungs to some degree. (She had apparently gotten substantial relief of her breathing after the endoscopy, Botox injection, and food disimpaction of the esophagus by Dr.Magod a week earlier.) At the time of my endoscopy on  February 5, there was a large amount of food debris within the esophagus, which was able to be completely cleared out by a combination of suctioning and use of a International aid/development worker. There was no apparent injury to the esophageal mucosa to suggest esophageal perforation at that time. However, the patient was somewhat short of breath both before and after the procedure, although she did appear quite comfortable in the recovery area.  The patient indicates that she has been compliant with the liquid diet I put her on. She indicates that the liquids are "going down well."  Since admission yesterday, the patient has had a repeat chest CT which showed progression of her left-sided pleural effusion and some degree of progression of the pericardial effusion. I have reviewed the CT personally with the radiologist, Dr. Loralie Champagne. There has been indeed some progression in the pericardial fluid, but no mediastinal air, bilateral effusions, or "ragged appearance" to make Dr.Gallerani feel that there had been an esophageal perforation. The patient has some atelectasis without frank pneumonia on CT scan.   Since admission, she has had a fever to 102, and an initial leukocytosis of 20,600, which has improved on antibiotic therapy (Zosyn) overnight to a level of 15,600.  We were asked to see the patient primarily to address the possibility that the  clinical and radiographic progression represent a possible complication of her recent endoscopic examinations, such as esophageal perforation.  Past Medical History  Diagnosis Date  . GERD (gastroesophageal reflux disease)   . Heart murmur   . Achalasia     S/p Heller myotomy 1991 and numerous dilations    Past Surgical History  Procedure Date  . Achalasia surgery  1991    transthoracic Heller myotomy in [place]  . Foreign body removal 10/08/2011    Procedure: FOREIGN BODY REMOVAL;  Surgeon: Petra Kuba, MD;  Location: [facility] ENDOSCOPY;  Service: Endoscopy;   Laterality: N/A;  botox  needed /ja/magod  . Esophagogastroduodenoscopy 10/08/2011    Procedure: ESOPHAGOGASTRODUODENOSCOPY (EGD);  Surgeon: Petra Kuba, MD;  Location: [facility] ENDOSCOPY;  Service: Endoscopy;  Laterality: N/A;  . Esophagogastroduodenoscopy 10/16/2011    Procedure: ESOPHAGOGASTRODUODENOSCOPY (EGD);  Surgeon: Florencia Reasons, MD;  Location: [facility] ENDOSCOPY;  Service: Endoscopy;  Laterality: N/A;  . Foreign body removal 10/16/2011    Procedure: FOREIGN BODY REMOVAL;  Surgeon: Florencia Reasons, MD;  Location: [facility] ENDOSCOPY;  Service: Endoscopy;  Laterality: N/A;  . Cesarean section     x2    Prior to Admission medications   Not on File    Current Facility-Administered Medications  Medication Dose Route Frequency Provider Last Rate Last Dose  . 0.9 %  sodium chloride infusion   Intravenous Continuous Carlota Raspberry, MD 100 mL/hr at 10/18/11 2322    . acetaminophen (OFIRMEV) IV 1,000 mg  1,000 mg Intravenous Q6H PRN Pandora Leiter, MD   1,000 mg at 10/19/11 1452  . acetaminophen (TYLENOL) tablet 650 mg  650 mg Oral Q6H PRN Carlota Raspberry, MD       Or  . acetaminophen (TYLENOL) suppository 650 mg  650 mg Rectal Q6H PRN Carlota Raspberry, MD      . albuterol (PROVENTIL) (5 MG/ML) 0.5% nebulizer solution 2.5 mg  2.5 mg Nebulization Q6H PRN Carlota Raspberry, MD      . fentaNYL (SUBLIMAZE) injection 12.5 mcg  12.5 mcg Intravenous Q6H PRN Carlota Raspberry, MD      . iohexol (OMNIPAQUE) 300 MG/ML solution 100 mL  100 mL Intravenous Once PRN Medication Radiologist, MD   75 mL at 10/18/11 1847  . LORazepam (ATIVAN) 2 MG/ML injection        0.5 mg at 10/19/11 0107  . LORazepam (ATIVAN) injection 0.5 mg  0.5 mg Intravenous Q6H PRN Pandora Leiter, MD   0.5 mg at 10/19/11 7425  . LORazepam (ATIVAN) tablet 0.5 mg  0.5 mg Oral Once Rolan Lipa, NP      . ondansetron [facility]) tablet 4 mg  4 mg Oral Q6H PRN Carlota Raspberry, MD       Or  . ondansetron [facility]) injection 4 mg  4 mg Intravenous Q6H PRN Carlota Raspberry,  MD      . piperacillin-tazobactam (ZOSYN) IVPB 3.375 g  3.375 g Intravenous Once Carlota Raspberry, MD   3.375 g at 10/18/11 2321  . piperacillin-tazobactam (ZOSYN) IVPB 3.375 g  3.375 g Intravenous Q8H Carlota Raspberry, MD   3.375 g at 10/19/11 0535  . sodium chloride 0.9 % injection 3 mL  3 mL Intravenous Q12H Carlota Raspberry, MD   3 mL at 10/19/11 0937  . vancomycin (VANCOCIN) IVPB 1000 mg/200 mL premix  1,000 mg Intravenous Q8H Carlota Raspberry, MD   1,000 mg at 10/19/11 0535  . DISCONTD: 0.9 %  sodium chloride infusion   Intravenous STAT Toy Baker, MD        Allergies as of 10/18/2011  . (No Known Allergies)    Family History  Problem Relation Age of Onset  . Cancer Paternal Grandmother     breast  . Hypertension Mother     History   Social History  . Marital Status: Married    Spouse Name: N/A  Number of Children: N/A  . Years of Education: N/A   Occupational History  . Not on file.   Social History Main Topics  . Smoking status: Former Smoker -- .5 years    Types: Cigarettes    Quit date: 10/07/1990  . Smokeless tobacco: Never Used   Comment: Smoked for 2 months, nothing heavier  . Alcohol Use: No     Doesn't drink but very rarely  . Drug Use: No  . Sexually Active: Not on file   Other Topics Concern  . Not on file   Social History Narrative   Lives at home with husband, working and goes to school, has 2 kids. Very minimal smoking history, very rare alcohol.     Review of Systems: Positive for pleuritic symptoms, shortness of breath, and cough as outlined in the history of present illness.  Physical Exam: Vital signs in last 24 hours: Temp:  [98.1 F (36.7 C)-102.1 F (38.9 C)] 102.1 F (38.9 C) (02/08 1331) Pulse Rate:  [83-117] 117  (02/08 1331) Resp:  [18-24] 18  (02/08 1331) BP: (106-118)/(70-80) 118/80 mmHg (02/08 1331) SpO2:  [94 %-99 %] 94 % (02/08 1331) Weight:  [81.647 kg (180 lb)-91.9 kg (202 lb 9.6 oz)] 91.9 kg (202 lb 9.6 oz) (02/07 2200) Last BM Date:  10/17/11 General:   Alert,  Well-developed, well-nourished, pleasant and cooperative in  mild respiratory distress and appearing somewhat anxious. She's not really overtly dyspneic at rest, not using any accessory muscles of respiration.  Head:  Normocephalic and atraumatic. Eyes:  Sclera clear, no icterus.   Conjunctiva pink. Mouth:   No ulcerations or lesions.  Oropharynx pink & moist. Neck:   No masses or thyromegaly. Lungs: There are decreased breath sounds with egophony at the left base. The remainder of the chest is clear  Heart:    somewhat rapid rate, regular rhythm; no murmurs, clicks, rubs,  or gallops. Abdomen:  Soft, nontender, nontympanitic, and nondistended. No masses, hepatosplenomegaly or ventral hernias noted. Normal bowel sounds, without bruits, guarding, or rebound.   Msk:   Symmetrical without gross deformities. Extremities:   Without clubbing, cyanosis, or edema. Neurologic:  Alert and coherent;  grossly normal neurologically. Skin:  Intact without significant lesions or rashes. Cervical Nodes:  No significant cervical adenopathy. Psych:   Alert and cooperative.  however, appear somewhat anxious and slightly tearful intermittently.  Intake/Output from previous day: 02/07 0701 - 02/08 0700 In: 1101.7 [I.V.:651.7; IV Piggyback:450] Out: 450 [Urine:450] Intake/Output this shift: Total I/O In: 3 [I.V.:3] Out: -   Lab Results:  Basename 10/19/11 0550 10/18/11 1830  WBC 15.6* 20.6*  HGB 9.1* 9.2*  HCT 26.9* 27.6*  PLT 448* 508*   BMET  Basename 10/19/11 0550 10/18/11 1830  NA 136 136  K 3.9 3.6  CL 102 100  CO2 24 25  GLUCOSE 108* 97  BUN 10 12  CREATININE 0.80 0.80  CALCIUM 8.9 9.5   LFT No results found for this basename: PROT,ALBUMIN,AST,ALT,ALKPHOS,BILITOT,BILIDIR,IBILI in the last 72 hours PT/INR No results found for this basename: LABPROT:2,INR:2 in the last 72 hours  Studies/Results: Ct Angio Chest W/cm &/or Wo Cm  10/18/2011  *RADIOLOGY  REPORT*  Clinical Data: Short of breath  CT ANGIOGRAPHY CHEST  Technique:  Multidetector CT imaging of the chest using the standard protocol during bolus administration of intravenous contrast. Multiplanar reconstructed images including MIPs were obtained and reviewed to evaluate the vascular anatomy.  Contrast: 75mL OMNIPAQUE IOHEXOL 300 MG/ML IV SOLN  Comparison: 10/12/2011  Findings: Respiratory motion limits the study.  No obvious filling defects in the pulmonary arterial tree.  Pericardial effusion minimally worse.  Moderate left pleural effusion is worse.  Left lower lobe collapse is worse.  The esophagus remains markedly dilated containing fluid contents and air.  This is not significantly changed.  Mediastinal stranding and adenopathy is worse.  Prevascular 11 mm short axis diameter node on image 34.  Other adjacent smaller nodes.  No pneumothorax.  Otherwise stable exam.  IMPRESSION: Compared the prior study, the pericardial effusion, left pleural effusion, left lower lobe volume loss, and mediastinal adenopathy with stranding are all worse.  These findings suggest a worsening inflammatory process.  The no evidence of acute pulmonary thromboembolism.  Original Report Authenticated By: Donavan Burnet, M.D.    Impression: 1. Fever, leukocytosis, and pleural and pericardial fluid and atelectasis and associated symptoms such as dyspnea and pleurisy. As per above discussion, I do not think this reflects an esophageal perforation, but more likely, some other process, perhaps aspiration pneumonia related to her large esophageal residual. It is conceivable the patient may have aspirated during her recent endoscopy, but there was clearly an element of pulmonary symptoms going on prior to that procedure. As noted above, it does not appear that esophageal perforation is an issue here.  2. Achalasia with significant esophageal food retention, currently managed adequately on full liquid diet, awaiting surgical  opinion regarding a redo of her Heller myotomy.   Plan: We will follow the patient with you.   I have discussed the case with Dr. Irene Limbo, and agree with this plan for pulmonary consultation as well as sort of a rheumatologic workup. I also concur with antibiotic therapy.   Given the problem this patient has emptying her esophagus, I would not be inclined to do a radiographic contrast study of the esophagus, such as a Gastrografin swallow or barium swallow, especially given the low index of suspicion of an esophageal perforation being present.   LOS: 1 day   Blanche Scovell V  10/19/2011, 2:59 PM

## 2011-10-19 NOTE — ED Provider Notes (Signed)
Medical screening examination/treatment/procedure(s) were performed by non-physician practitioner and as supervising physician I was immediately available for consultation/collaboration.  Toy Baker, MD 10/19/11 [phone]

## 2011-10-19 NOTE — Progress Notes (Signed)
I have ordered for the patient to resume the full liquid diet she was on at home, since there is essentially no suspicion of an esophageal perforation (see my Consult note from today).  Florencia Reasons, M.D. [phone]

## 2011-10-20 ENCOUNTER — Inpatient Hospital Stay (HOSPITAL_COMMUNITY): Payer: PRIVATE HEALTH INSURANCE

## 2011-10-20 LAB — CBC
HCT: 26.1 % — ABNORMAL LOW (ref 36.0–46.0)
Hemoglobin: 8.6 g/dL — ABNORMAL LOW (ref 12.0–15.0)
MCH: 20.8 pg — ABNORMAL LOW (ref 26.0–34.0)
MCHC: 33 g/dL (ref 30.0–36.0)
MCV: 63.2 fL — ABNORMAL LOW (ref 78.0–100.0)
Platelets: 456 10*3/uL — ABNORMAL HIGH (ref 150–400)
RBC: 4.13 MIL/uL (ref 3.87–5.11)
RDW: 14.8 % (ref 11.5–15.5)
WBC: 12.1 10*3/uL — ABNORMAL HIGH (ref 4.0–10.5)

## 2011-10-20 LAB — AMYLASE, BODY FLUID: Amylase, Fluid: 28 U/L

## 2011-10-20 LAB — PROTEIN, BODY FLUID: Total protein, fluid: 5 g/dL

## 2011-10-20 LAB — PH, BODY FLUID: pH, Fluid: 8

## 2011-10-20 LAB — BODY FLUID CELL COUNT WITH DIFFERENTIAL
Eos, Fluid: 0 %
Lymphs, Fluid: 35 %
Monocyte-Macrophage-Serous Fluid: 39 % — ABNORMAL LOW (ref 50–90)
Neutrophil Count, Fluid: 26 % — ABNORMAL HIGH (ref 0–25)
Total Nucleated Cell Count, Fluid: 1259 cu mm — ABNORMAL HIGH (ref 0–1000)

## 2011-10-20 LAB — URINE CULTURE
Culture  Setup Time: 201302080448
Special Requests: NORMAL

## 2011-10-20 LAB — BASIC METABOLIC PANEL
BUN: 7 mg/dL (ref 6–23)
CO2: 23 mEq/L (ref 19–32)
Calcium: 9.1 mg/dL (ref 8.4–10.5)
Chloride: 104 mEq/L (ref 96–112)
Creatinine, Ser: 0.82 mg/dL (ref 0.50–1.10)
GFR calc Af Amer: >90 mL/min (ref >90)
GFR calc non Af Amer: 88 mL/min — ABNORMAL LOW (ref >90)
Glucose, Bld: 95 mg/dL (ref 70–99)
Potassium: 3.9 mEq/L (ref 3.5–5.1)
Sodium: 138 mEq/L (ref 135–145)

## 2011-10-20 LAB — VANCOMYCIN, TROUGH: Vancomycin Tr: 20.3 ug/mL — ABNORMAL HIGH (ref 10.0–20.0)

## 2011-10-20 LAB — LACTATE DEHYDROGENASE, PLEURAL OR PERITONEAL FLUID: LD, Fluid: 146 U/L — ABNORMAL HIGH (ref 3–23)

## 2011-10-20 LAB — HAPTOGLOBIN: Haptoglobin: 466 mg/dL — ABNORMAL HIGH (ref 30–200)

## 2011-10-20 MED ORDER — METHOCARBAMOL 100 MG/ML IJ SOLN
500.0000 mg | Freq: Three times a day (TID) | INTRAVENOUS | Status: DC | PRN
  Administered 2011-10-21: 500 mg via INTRAVENOUS
  Filled 2011-10-20 (×2): qty 5

## 2011-10-20 MED ORDER — HYDROMORPHONE HCL PF 1 MG/ML IJ SOLN: 0.5000 mg | INTRAMUSCULAR | Status: DC | PRN

## 2011-10-20 NOTE — Progress Notes (Signed)
Inpatient Progress Note Patient ID: [name] MRN: [id number] DOB/AGE: 1970-12-18 41 y.o.  Subjective: 41 y.o. female with long-standing achalasia, status post previous Heller myotomy, admitted 2/7 with increaseddyspnea, orthopnea, pleuritis symptoms with some pain on deep breathing. These symptoms seem to have been more pronounced since she underwent endoscopy with Botox injection on January 28, which was performed because of severe dysphagia and food retention. She has been followed by GI and gen surgery in the out-pt setting w/ CT scan of the chest was obtained on January 31 which showed a small pleural effusion, minor atelectasis, and a small pericardial effusion. On February 5 she underwent repeat endoscopy by Buccini (She had apparently gotten substantial relief of her breathing after the endoscopy, Botox injection, and food disimpaction of the esophagus by Dr.Magod a week earlier.) On repeat endoscopy February 5, there was a large amount of food debris within the esophagus, which was able to be completely cleared out by a combination of suctioning and use of a International aid/development worker. There was no apparent injury to the esophageal mucosa to suggest esophageal perforation at that time. However, the patient was somewhat short of breath both before and after the procedure. She was placed on thin liquid fluid restrictions. On 2/7 a repeat chest CT was done which showed progression of her left-sided pleural effusion and some degree of progression of the pericardial effusion. Since admission, she has had a fever to 102, and an initial leukocytosis of 20,600, which has improved on antibiotic therapy (Zosyn) overnight to a level of 15,600. PCCM asked to evaluate left pleural effusion.   2/9 >> had thoracentesis by IR; states she's feeling better w/ less discomfort & SOB; able to ambulate on the ward.  Objective: Vital signs in last 24 hours: Filed Vitals:   10/20/11 0933 10/20/11 0936 10/20/11 1145  10/20/11 1355  BP: 123/64 128/62 108/66 108/65  Pulse:   95 92  Temp:   98.3 F (36.8 C) 98.4 F (36.9 C)  TempSrc:   Oral Oral  Resp:   16 18  Height:      Weight:      SpO2: 100% 100% 99% 98%   Intake/Output from previous day:  Intake/Output Summary (Last 24 hours) at 10/20/11 1537 Last data filed at 10/20/11 1358  Gross per 24 hour  Intake 3121.66 ml  Output   4301 ml  Net -1179.34 ml    Medications: Scheduled Meds:   . piperacillin-tazobactam (ZOSYN)  IV  3.375 g Intravenous Q8H  . sodium chloride  3 mL Intravenous Q12H  . vancomycin  1,000 mg Intravenous Q8H  . DISCONTD: LORazepam  0.5 mg Oral Once   Continuous Infusions:   . DISCONTD: sodium chloride 100 mL/hr at 10/19/11 2114   PRN Meds:.acetaminophen, acetaminophen, acetaminophen, albuterol, HYDROmorphone (DILAUDID) injection, LORazepam, methocarbamol(ROBAXIN) IV, ondansetron (ZOFRAN) IV, ondansetron, DISCONTD: fentaNYL  Physical Exam: Vital Signs:  Reviewed...  General:  WD, WN,    y/o WM in NAD; alert & oriented; pleasant & cooperative... HEENT:  [name]/AT; Conjunctiva- pink, Sclera- nonicteric, EOM-wnl, PERRLA, Fundi-benign; EACs-clear, TMs-wnl; NOSE-clear; THROAT-clear & wnl. Neck:  Supple w/ full ROM; no JVD; normal carotid impulses w/o bruits; no thyromegaly or nodules palpated; no lymphadenopathy. Chest:  Clear to P & A; without wheezes, rales, or rhonchi heard. Heart:  Regular Rhythm; norm S1 & S2 without murmurs, rubs, or gallops detected. Abdomen:  Soft & nontender- no guarding or rebound; normal bowel sounds; no organomegaly or masses palpated.  Rectal:  Neg - prostate  2+ & nontender w/o nodules; stool hematest neg. Ext:  Normal ROM; without deformities or arthritic changes; no varicose veins, venous insuffic, or edema;  Pulses intact w/o bruits. Neuro:  CNs II-XII intact; motor testing normal; sensory testing normal; gait normal & balance OK. Derm:  No lesions noted; no rash etc. Lymph:  No cervical,  supraclavicular, axillary, or inguinal adenopathy palpated.  Lab Results:  Lab 10/20/11 0437 10/19/11 0550 10/18/11 1830  NA 138 136 136  K 3.9 3.9 3.6  CL 104 102 100  CO2 23 24 25   BUN 7 10 12   CREATININE 0.82 0.80 0.80  GLUCOSE 95 108* 97  ]  Lab 10/20/11 0437 10/19/11 0550 10/18/11 1830  HGB 8.6* 9.1* 9.2*  HCT 26.1* 26.9* 27.6*  WBC 12.1* 15.6* 20.6*  PLT 456* 448* 508*  }  Culture Results: Recent Results (from the past 240 hour(s))  URINE CULTURE     Status: Normal (Preliminary result)   Collection Time   10/18/11 10:46 PM      Component Value Range Status Comment   Specimen Description URINE, CLEAN CATCH   Final    Special Requests Normal   Final    Culture  Setup Time [id number]   Final    Colony Count PENDING   Incomplete    Culture Culture reincubated for better growth   Final    Report Status PENDING   Incomplete   CULTURE, BLOOD (ROUTINE X 2)     Status: Normal (Preliminary result)   Collection Time   10/18/11 11:20 PM      Component Value Range Status Comment   Specimen Description BLOOD LEFT HAND   Final    Special Requests BOTTLES DRAWN AEROBIC AND ANAEROBIC 3CC   Final    Culture  Setup Time [id number]   Final    Culture     Final    Value:        BLOOD CULTURE RECEIVED NO GROWTH TO DATE CULTURE WILL BE HELD FOR 5 DAYS BEFORE ISSUING A FINAL NEGATIVE REPORT   Report Status PENDING   Incomplete   CULTURE, BLOOD (ROUTINE X 2)     Status: Normal (Preliminary result)   Collection Time   10/18/11 11:25 PM      Component Value Range Status Comment   Specimen Description BLOOD RIGHT HAND   Final    Special Requests BOTTLES DRAWN AEROBIC AND ANAEROBIC 2.5CC   Final    Culture  Setup Time [id number]   Final    Culture     Final    Value:        BLOOD CULTURE RECEIVED NO GROWTH TO DATE CULTURE WILL BE HELD FOR 5 DAYS BEFORE ISSUING A FINAL NEGATIVE REPORT   Report Status PENDING   Incomplete     Studies/Results: Ct Angio Chest W/cm &/or Wo Cm  10/18/2011   *RADIOLOGY REPORT*  Clinical Data: Short of breath  CT ANGIOGRAPHY CHEST  Technique:  Multidetector CT imaging of the chest using the standard protocol during bolus administration of intravenous contrast. Multiplanar reconstructed images including MIPs were obtained and reviewed to evaluate the vascular anatomy.  Contrast: 75mL OMNIPAQUE IOHEXOL 300 MG/ML IV SOLN  Comparison: 10/12/2011  Findings: Respiratory motion limits the study.  No obvious filling defects in the pulmonary arterial tree.  Pericardial effusion minimally worse.  Moderate left pleural effusion is worse.  Left lower lobe collapse is worse.  The esophagus remains markedly dilated containing fluid contents and air.  This is not  significantly changed.  Mediastinal stranding and adenopathy is worse.  Prevascular 11 mm short axis diameter node on image 34.  Other adjacent smaller nodes.  No pneumothorax.  Otherwise stable exam.  IMPRESSION: Compared the prior study, the pericardial effusion, left pleural effusion, left lower lobe volume loss, and mediastinal adenopathy with stranding are all worse.  These findings suggest a worsening inflammatory process.  The no evidence of acute pulmonary thromboembolism.  Original Report Authenticated By: Donavan Burnet, M.D.   Dg Chest 1vsame Day  10/20/2011  *RADIOLOGY REPORT*  Clinical Data: Status post left thoracentesis  CHEST - 1 VIEW SAME DAY  Comparison: CT chest dated 10/18/2011  Findings: Small left pleural effusion with associated left lower lobe opacity, atelectasis versus pneumonia.  No pneumothorax is seen.  Abnormal soft tissue in the lower right paramediastinum likely reflects fluid/debris within a dilated esophagus when correlating with CT.  The cardiomediastinal silhouette is mildly enlarged.  Visualized osseous structures are within normal limits.  IMPRESSION: No pneumothorax is seen status post thoracentesis.  Small left pleural effusion with associated left lower lobe opacity, atelectasis  versus pneumonia.  Fluid/debris within a dilated esophagus.  Original Report Authenticated By: Charline Bills, M.D.   US Thoracentesis Asp Pleural Space W/img Guide  10/20/2011  *RADIOLOGY REPORT*  Clinical Data: *RADIOLOGY REPORT*  Clinical Data:  Left pleural effusion  ULTRASOUND GUIDED left THORACENTESIS  Comparison:  None  An ultrasound guided thoracentesis was thoroughly discussed with the patient and questions answered.  The benefits, risks, alternatives and complications were also discussed.  The patient understands and wishes to proceed with the procedure.  Written consent was obtained.  Ultrasound was performed to localize and mark an adequate pocket of fluid in the left chest.  The area was then prepped and draped in the normal sterile fashion.  1% Lidocaine was used for local anesthesia.  Under ultrasound guidance a 19 gauge Yueh catheter was introduced.  Thoracentesis was performed.  The catheter was removed and a dressing applied.  Complications:  None  Findings: A total of approximately 600 ml of yellow fluid was removed. A fluid sample was sent for laboratory analysis.  IMPRESSION: Successful ultrasound guided left thoracentesis yielding 600 ml of pleural fluid.  Read by: Ralene Muskrat, P.A.-C  RADIOLOGY EXAMINATION  Comparison: None.  Findings:  IMPRESSION:  Original Report Authenticated By: Reola Calkins, M.D.    Assessment/Plan: Pneumonia vs atelectasis with left pleural effusion:   Agree w/ primary team concern for aspiration pna, doubt possibility of recent esophageal perf (radiology felt CT chest was reassuring that perf unlikely). In setting of fever, and given history would feel compelled to evaluate pleural fluid to r/o empyema vs para-pneumonic process.  recs  -IR guided thoracentesis w/ full fluid analysis, incl amylase ==> done 2/9 -cont IV antibiotics==> on Zosyn & Vanco -careful f/u CXRs  2/9> s/p ultrasound guided left thoracentesis this morning at ~9:30AM; 600cc of  cloudy yellow fluid removed:    TProt=5.0 ==> c/w exudate...    LDH=146    Cell count & diff> 1259 WBC w/ 26%Neutrophils, 35%Lymphs, 39%Monos    Amylase=28    Cult & Cyto==> pending Post tap CXR showed No pneumothorax, Small left pleural effusion with associated left lower lobe opacity, atelectasis versus pneumonia. Fluid/debris within a dilated esophagus.   Michele Mcalpine, MD 10/20/2011, 3:37 PM

## 2011-10-20 NOTE — Progress Notes (Signed)
Subjective: Thoracentesis done earlier today. Breathing improving; able to ambulate the halls without difficulty.  Objective: Vital signs in last 24 hours: Temp:  [98.3 F (36.8 C)-102.1 F (38.9 C)] 98.3 F (36.8 C) (02/09 1145) Pulse Rate:  [95-117] 95  (02/09 1145) Resp:  [16-18] 16  (02/09 1145) BP: (104-128)/(62-80) 108/66 mmHg (02/09 1145) SpO2:  [93 %-100 %] 99 % (02/09 1145) Weight change:  Last BM Date: 10/20/11  PE: GEN:  NAD HEENT:  Slightly dry MM LUNGS:  Decreased aeration with dullness left base cf. Right; otherwise good aeration ABD:  Soft  Chest xray:  No pneumothorax post thoracentesis; some fluid/debris in distal esophagus  Assessment:  1.  Achalasia, recurrent, post Heller myotomy many years ago. 2.  Likely aspiration pneumonia +/- pneumonitis.  Plan:  1.  Antibiotics and medical management for pneumonia/pneumonitis. 2.  Liquid diet only until she has definitive surgery therapy for her achalasia.  NO SOLID or SEMI-SOLID foods. 3.  Will arrange outpatient follow-up with Dr. Bosie Clos, but expedited outpatient follow-up with Dr. Daphine Deutscher for consideration of redo Heller myotomy is most pressing matter. 4.  Will sign off; please call with questions.   [name] 10/20/2011, 12:31 PM

## 2011-10-20 NOTE — Progress Notes (Signed)
Patient Back from thoracentesis, denies any pain or distress, aspiration site intact covered with Band-Aid. Will continue to assess patient.

## 2011-10-20 NOTE — Progress Notes (Signed)
Patient C/O left flank/under left breast pain/spasm rated pain 8 out of 10 12.46mcg of fentanyl given which helped with the pain some per patient and Dr. Irene Limbo notified as well. Will continue to assess patient.  Thoracentesis site intact not bleeding and paitent denies any pain from the site.

## 2011-10-20 NOTE — Progress Notes (Signed)
PROGRESS NOTE  [name] ZOX:[id number] DOB: 29-Jun-1971 DOA: 10/18/2011 PCP: Provider Not In System Gastroenterology: Charlott Rakes, M.D.  Brief narrative: 40yoF with h/o achalasia s/p Heller myotomy 1991 and multiple dilations, otherwise quite healthy, presents with fevers, pleuritic chest pain, and SOB and found to have LLL lung collapse, moderate left pleural effusion, pericardial effusion, mediastinal adenopathy, WBC 20 and worsened anemia.   She now presents because for the past 1-2 weeks, i.e. during the same time frame that she's had 2 EGD/botox injections, she feels chest pain with breathing or when she lays on her side, and feels she can't catch her breath. She notes a fever to 102 at the clinic the other day. She contacted outpt GI office and was referred to ED.   In the ED, pt tachycardic to 112. Normal chemistry panel. WBC 20.6 with normal diff but hypersegmented neutrophils noted; Hct was 27.6 which is down from 35.3 at end of Jan, MCV is 63, and plts are 508. CTA chest showed pericardial effusion that was minimally worse, moderate left pleural effusion that was worse, left lower lobe collapse that was worse, and a markedly dilated esophagus with fluid contents and air, but not significantly changed; also with mediastinal adenopathy and stranding that are worse.  Past medical history: Achalasia, GERD, heart murmur  Consultants:  Gastroenterology  Pulmonology  Procedures:  Prior to admission February 5: EGD with foreign body removal.  Prior to admission January 28: EGD with foreign body removal and Botox injection.  February 8: 2-D echocardiogram: Normal left ventricular ejection fraction. Small pericardial effusion not hemodynamically significant. Free-flowing.  February 8: Diagnostic and therapeutic pleurocentesis with evacuation of 600 mL fluid.  Antibiotics:  February 7: Zosyn  February 7: Vancomycin  Interim History: Interval documentation reviewed. Patient  discussed with Dr. Matthias Hughs yesterday in detail. Pulmonology has recommended thoracentesis with fluid analysis and continuation of antibiotics. No afebrile approximately 18 hours. Respiratory rate and oxygenation normal. Leukocytosis trending downward.  Subjective: Overall feels quite a bit better. Breathing better. Less dyspnea on exertion.  Objective: Filed Vitals:   10/19/11 1540 10/19/11 1700 10/19/11 2050 10/20/11 0300  BP:   104/70 109/70  Pulse:   100 96  Temp: 98.9 F (37.2 C)  98.8 F (37.1 C) 98.9 F (37.2 C)  TempSrc:  Oral Oral Oral  Resp:   18 18  Height:      Weight:      SpO2:   94% 93%    Intake/Output Summary (Last 24 hours) at 10/20/11 0814 Last data filed at 10/20/11 8119  Gross per 24 hour  Intake 3762.99 ml  Output   3100 ml  Net 662.99 ml    Exam:   General:  Appears calm and more comfortable.  Cardiovascular: Regular rate and rhythm. No murmur, rub, gallop. No significant lower extremity edema.  Respiratory: Improved breath sounds. Clear to auscultation bilaterally. No frank wheezes rales or rhonchi. Decreased work of breathing.  Skin: Appears grossly unremarkable.  Data Reviewed: Basic Metabolic Panel:  Lab 10/20/11 1478 10/19/11 0550 10/18/11 1830  NA 138 136 136  K 3.9 3.9 --  CL 104 102 100  CO2 23 24 25   GLUCOSE 95 108* 97  BUN 7 10 12   CREATININE 0.82 0.80 0.80  CALCIUM 9.1 8.9 9.5  MG -- -- --  PHOS -- -- --   CBC:  Lab 10/20/11 0437 10/19/11 0550 10/18/11 1830  WBC 12.1* 15.6* 20.6*  NEUTROABS -- -- 15.9*  HGB 8.6* 9.1* 9.2*  HCT 26.1* 26.9* 27.6*  MCV 63.2* 63.0* 63.2*  PLT 456* 448* 508*   Studies: Ct Angio Chest W/cm &/or Wo Cm  10/18/2011  *RADIOLOGY REPORT*  Clinical Data: Short of breath  CT ANGIOGRAPHY CHEST  Technique:  Multidetector CT imaging of the chest using the standard protocol during bolus administration of intravenous contrast. Multiplanar reconstructed images including MIPs were obtained and reviewed to  evaluate the vascular anatomy.  Contrast: 75mL OMNIPAQUE IOHEXOL 300 MG/ML IV SOLN  Comparison: 10/12/2011  Findings: Respiratory motion limits the study.  No obvious filling defects in the pulmonary arterial tree.  Pericardial effusion minimally worse.  Moderate left pleural effusion is worse.  Left lower lobe collapse is worse.  The esophagus remains markedly dilated containing fluid contents and air.  This is not significantly changed.  Mediastinal stranding and adenopathy is worse.  Prevascular 11 mm short axis diameter node on image 34.  Other adjacent smaller nodes.  No pneumothorax.  Otherwise stable exam.  IMPRESSION: Compared the prior study, the pericardial effusion, left pleural effusion, left lower lobe volume loss, and mediastinal adenopathy with stranding are all worse.  These findings suggest a worsening inflammatory process.  The no evidence of acute pulmonary thromboembolism.  Original Report Authenticated By: Donavan Burnet, M.D.    Scheduled Meds:    . LORazepam  0.5 mg Oral Once  . piperacillin-tazobactam (ZOSYN)  IV  3.375 g Intravenous Q8H  . sodium chloride  3 mL Intravenous Q12H  . vancomycin  1,000 mg Intravenous Q8H   Continuous Infusions:    . sodium chloride 100 mL/hr at 10/19/11 2114     Assessment/Plan: 1. Sepsis: Resolved. Secondary to pneumonia. Oxygenation stable. Leukocytosis improved. 2. Left lower lung collapse/pleural effusion/pericardial effusion: Differential includes aspiration pneumonia, mediastinitis, autoimmune/rheumatologic disorder; procedural trauma is doubted. Nontoxic. Continue empiric antibiotics. Followup cultures. Appreciate consultants' evaluation and care. 3. Pericardial effusion: Appears to be clinically insignificant by 2-D echocardiogram. 4. Microcytic anemia: Patient is unaware of this diagnosis. She is followed in the outpatient setting by gastroenterology. Would defer further evaluation to gastroenterology. Continue to follow for  now.  Code Status: Full code Family Communication: Discussed with family at bedside Disposition Plan: Pending further evaluation   Brendia Sacks, MD  [facility] Pager [phone] 10/20/2011, 8:14 AM    LOS: 2 days

## 2011-10-20 NOTE — Procedures (Signed)
Left US guided thoracentesis  600 cc yellow fluid obtained Pt tolerated well cxr pending

## 2011-10-20 NOTE — Progress Notes (Signed)
ANTIBIOTIC CONSULT NOTE - FOLLOW UP  Pharmacy Consult for Vancomycin Indication: pneumonia  No Known Allergies  Patient Measurements: Height: 5\' 6"  (167.6 cm) Weight: 202 lb 9.6 oz (91.9 kg) IBW/kg (Calculated) : 59.3  Adjusted Body Weight:   Vital Signs: Temp: 99.3 F (37.4 C) (02/09 2116) Temp src: Oral (02/09 2116) BP: 123/79 mmHg (02/09 2116) Pulse Rate: 94  (02/09 2116) Intake/Output from previous day: 02/08 0701 - 02/09 0700 In: 3763 [P.O.:490; I.V.:2423; IV Piggyback:850] Out: 3100 [Urine:3100] Intake/Output from this shift:    Labs:  Basename 10/20/11 0437 10/19/11 0550 10/18/11 1830  WBC 12.1* 15.6* 20.6*  HGB 8.6* 9.1* 9.2*  PLT 456* 448* 508*  LABCREA -- -- --  CREATININE 0.82 0.80 0.80   Estimated Creatinine Clearance: 103 ml/min (by C-G formula based on Cr of 0.82).  Basename 10/20/11 2044  VANCOTROUGH 20.3*  VANCOPEAK --  Drue Dun --  GENTTROUGH --  GENTPEAK --  GENTRANDOM --  TOBRATROUGH --  TOBRAPEAK --  TOBRARND --  AMIKACINPEAK --  AMIKACINTROU --  AMIKACIN --     Microbiology: Recent Results (from the past 720 hour(s))  URINE CULTURE     Status: Normal   Collection Time   10/18/11 10:46 PM      Component Value Range Status Comment   Specimen Description URINE, CLEAN CATCH   Final    Special Requests Normal   Final    Culture  Setup Time [id number]   Final    Colony Count 20,OOO COLONIES/ML   Final    Culture     Final    Value: Multiple bacterial morphotypes present, none predominant. Suggest appropriate recollection if clinically indicated.   Report Status 10/20/2011 FINAL   Final   CULTURE, BLOOD (ROUTINE X 2)     Status: Normal (Preliminary result)   Collection Time   10/18/11 11:20 PM      Component Value Range Status Comment   Specimen Description BLOOD LEFT HAND   Final    Special Requests BOTTLES DRAWN AEROBIC AND ANAEROBIC 3CC   Final    Culture  Setup Time [id number]   Final    Culture     Final    Value:         BLOOD CULTURE RECEIVED NO GROWTH TO DATE CULTURE WILL BE HELD FOR 5 DAYS BEFORE ISSUING A FINAL NEGATIVE REPORT   Report Status PENDING   Incomplete   CULTURE, BLOOD (ROUTINE X 2)     Status: Normal (Preliminary result)   Collection Time   10/18/11 11:25 PM      Component Value Range Status Comment   Specimen Description BLOOD RIGHT HAND   Final    Special Requests BOTTLES DRAWN AEROBIC AND ANAEROBIC 2.5CC   Final    Culture  Setup Time [id number]   Final    Culture     Final    Value:        BLOOD CULTURE RECEIVED NO GROWTH TO DATE CULTURE WILL BE HELD FOR 5 DAYS BEFORE ISSUING A FINAL NEGATIVE REPORT   Report Status PENDING   Incomplete     Anti-infectives     Start     Dose/Rate Route Frequency Ordered Stop   10/19/11 0600  piperacillin-tazobactam (ZOSYN) IVPB 3.375 g       3.375 g 12.5 mL/hr over 240 Minutes Intravenous 3 times per day 10/18/11 2215     10/18/11 2230   vancomycin (VANCOCIN) IVPB 1000 mg/200 mL premix  1,000 mg 200 mL/hr over 60 Minutes Intravenous 3 times per day 10/18/11 2215     10/18/11 2215  piperacillin-tazobactam (ZOSYN) IVPB 3.375 g       3.375 g 100 mL/hr over 30 Minutes Intravenous  Once 10/18/11 2215 10/18/11 2351          Assessment: Patient with vancomycin level of 20.2 with level drawn >1 hour before dose due on q8 schedule.  Goal of Therapy:  Vancomycin trough level 15-20 mcg/ml  Plan:  Measure antibiotic drug levels at steady state Follow up culture results Will continue with current dose, recheck to make sure is not going higher.  Feel the level would be 20 or less if drawn within of next dose.  [name], Jacquenette Shone Crowford 10/20/2011,10:42 PM

## 2011-10-21 DIAGNOSIS — J90 Pleural effusion, not elsewhere classified: Secondary | ICD-10-CM

## 2011-10-21 DIAGNOSIS — K220 Achalasia of cardia: Secondary | ICD-10-CM

## 2011-10-21 DIAGNOSIS — J690 Pneumonitis due to inhalation of food and vomit: Secondary | ICD-10-CM

## 2011-10-21 LAB — CBC
HCT: 24.5 % — ABNORMAL LOW (ref 36.0–46.0)
Hemoglobin: 8.1 g/dL — ABNORMAL LOW (ref 12.0–15.0)
MCH: 21 pg — ABNORMAL LOW (ref 26.0–34.0)
MCHC: 33.1 g/dL (ref 30.0–36.0)
MCV: 63.5 fL — ABNORMAL LOW (ref 78.0–100.0)
Platelets: 482 10*3/uL — ABNORMAL HIGH (ref 150–400)
RBC: 3.86 MIL/uL — ABNORMAL LOW (ref 3.87–5.11)
RDW: 14.8 % (ref 11.5–15.5)
WBC: 11.2 10*3/uL — ABNORMAL HIGH (ref 4.0–10.5)

## 2011-10-21 LAB — EXPECTORATED SPUTUM ASSESSMENT W GRAM STAIN, RFLX TO RESP C: Special Requests: NORMAL

## 2011-10-21 NOTE — Progress Notes (Signed)
PROGRESS NOTE  [name] WUJ:[id number] DOB: 06/29/1971 DOA: 10/18/2011 PCP: Provider Not In System Gastroenterology: Charlott Rakes, M.D.  Brief narrative: 40yoF with h/o achalasia s/p Heller myotomy 1991 and multiple dilations, otherwise quite healthy, presents with fevers, pleuritic chest pain, and SOB and found to have LLL lung collapse, moderate left pleural effusion, pericardial effusion, mediastinal adenopathy, WBC 20 and worsened anemia.   She now presents because for the past 1-2 weeks, i.e. during the same time frame that she's had 2 EGD/botox injections, she feels chest pain with breathing or when she lays on her side, and feels she can't catch her breath. She notes a fever to 102 at the clinic the other day. She contacted outpt GI office and was referred to ED.   In the ED, pt tachycardic to 112. Normal chemistry panel. WBC 20.6 with normal diff but hypersegmented neutrophils noted; Hct was 27.6 which is down from 35.3 at end of Jan, MCV is 63, and plts are 508. CTA chest showed pericardial effusion that was minimally worse, moderate left pleural effusion that was worse, left lower lobe collapse that was worse, and a markedly dilated esophagus with fluid contents and air, but not significantly changed; also with mediastinal adenopathy and stranding that are worse.  Past medical history: Achalasia, GERD, heart murmur  Consultants:  Gastroenterology  Pulmonology  Procedures:  Prior to admission February 5: EGD with foreign body removal.  Prior to admission January 28: EGD with foreign body removal and Botox injection.  February 8: 2-D echocardiogram: Normal left ventricular ejection fraction. Small pericardial effusion not hemodynamically significant. Free-flowing.  February 8: Diagnostic and therapeutic pleurocentesis with evacuation of 600 mL fluid.  Antibiotics:  February 7: Zosyn  February 7: Vancomycin  Interim History: Interval documentation reviewed.    Subjective: Continues to improve. Ambulating without difficulty.  Objective: Filed Vitals:   10/20/11 1145 10/20/11 1355 10/20/11 2116 10/21/11 0457  BP: 108/66 108/65 123/79 115/79  Pulse: 95 92 94 94  Temp: 98.3 F (36.8 C) 98.4 F (36.9 C) 99.3 F (37.4 C) 98.7 F (37.1 C)  TempSrc: Oral Oral Oral Oral  Resp: 16 18 18 18   Height:      Weight:      SpO2: 99% 98% 97% 98%    Intake/Output Summary (Last 24 hours) at 10/21/11 1145 Last data filed at 10/21/11 1031  Gross per 24 hour  Intake    730 ml  Output   2801 ml  Net  -2071 ml    Exam:   General:  Appears calm and comfortable.  Cardiovascular: Regular rate and rhythm. No murmur, rub, gallop. No significant lower extremity edema.  Respiratory: Improved breath sounds. Clear to auscultation bilaterally. No frank wheezes rales or rhonchi. Decreased work of breathing.  Data Reviewed: Basic Metabolic Panel:  Lab 10/20/11 9562 10/19/11 0550 10/18/11 1830  NA 138 136 136  K 3.9 3.9 --  CL 104 102 100  CO2 23 24 25   GLUCOSE 95 108* 97  BUN 7 10 12   CREATININE 0.82 0.80 0.80  CALCIUM 9.1 8.9 9.5  MG -- -- --  PHOS -- -- --   CBC:  Lab 10/21/11 0605 10/20/11 0437 10/19/11 0550 10/18/11 1830  WBC 11.2* 12.1* 15.6* 20.6*  NEUTROABS -- -- -- 15.9*  HGB 8.1* 8.6* 9.1* 9.2*  HCT 24.5* 26.1* 26.9* 27.6*  MCV 63.5* 63.2* 63.0* 63.2*  PLT 482* 456* 448* 508*   Studies: Ct Angio Chest W/cm &/or Wo Cm  10/18/2011  *RADIOLOGY  REPORT*  Clinical Data: Short of breath  CT ANGIOGRAPHY CHEST  Technique:  Multidetector CT imaging of the chest using the standard protocol during bolus administration of intravenous contrast. Multiplanar reconstructed images including MIPs were obtained and reviewed to evaluate the vascular anatomy.  Contrast: 75mL OMNIPAQUE IOHEXOL 300 MG/ML IV SOLN  Comparison: 10/12/2011  Findings: Respiratory motion limits the study.  No obvious filling defects in the pulmonary arterial tree.  Pericardial  effusion minimally worse.  Moderate left pleural effusion is worse.  Left lower lobe collapse is worse.  The esophagus remains markedly dilated containing fluid contents and air.  This is not significantly changed.  Mediastinal stranding and adenopathy is worse.  Prevascular 11 mm short axis diameter node on image 34.  Other adjacent smaller nodes.  No pneumothorax.  Otherwise stable exam.  IMPRESSION: Compared the prior study, the pericardial effusion, left pleural effusion, left lower lobe volume loss, and mediastinal adenopathy with stranding are all worse.  These findings suggest a worsening inflammatory process.  The no evidence of acute pulmonary thromboembolism.  Original Report Authenticated By: Donavan Burnet, M.D.    Scheduled Meds:    . piperacillin-tazobactam (ZOSYN)  IV  3.375 g Intravenous Q8H  . sodium chloride  3 mL Intravenous Q12H  . vancomycin  1,000 mg Intravenous Q8H  . DISCONTD: LORazepam  0.5 mg Oral Once   Continuous Infusions:    . DISCONTD: sodium chloride 100 mL/hr at 10/19/11 2114     Assessment/Plan: 1. Sepsis: Resolved. Secondary to pneumonia. Oxygenation stable. Leukocytosis improved. 2. Left lower lung collapse/pleural effusion/pericardial effusion: Aspiration pneumonia is favored. Clinically responding to current antibiotics. Differential includes autoimmune/rheumatologic disorder; procedural trauma is doubted. Nontoxic.  Followup cultures. Appreciate consultants' evaluation and care. As aspiration is favored will discontinue vancomycin. 3. Pericardial effusion: Appears to be clinically insignificant by 2-D echocardiogram. 4. Microcytic anemia: Patient is unaware of this diagnosis. She is followed in the outpatient setting by gastroenterology. Would defer further evaluation to gastroenterology. Continue to follow for now.   Code Status: Full code Family Communication: Discussed with family at bedside Disposition Plan: Pending further evaluation   Brendia Sacks, MD  [facility] Pager [phone] 10/21/2011, 11:45 AM    LOS: 3 days

## 2011-10-21 NOTE — Progress Notes (Signed)
Inpatient Progress Note Patient ID: [name] MRN: [id number] DOB/AGE: October 14, 1970 41 y.o.  Subjective: 41 y.o. female with long-standing achalasia, status post previous Heller myotomy, admitted 2/7 with increaseddyspnea, orthopnea, pleuritis symptoms with some pain on deep breathing. These symptoms seem to have been more pronounced since she underwent endoscopy with Botox injection on January 28, which was performed because of severe dysphagia and food retention. She has been followed by GI and gen surgery in the out-pt setting w/ CT scan of the chest was obtained on January 31 which showed a small pleural effusion, minor atelectasis, and a small pericardial effusion. On February 5 she underwent repeat endoscopy by Buccini (She had apparently gotten substantial relief of her breathing after the endoscopy, Botox injection, and food disimpaction of the esophagus by Dr.Magod a week earlier.) On repeat endoscopy February 5, there was a large amount of food debris within the esophagus, which was able to be completely cleared out by a combination of suctioning and use of a International aid/development worker. There was no apparent injury to the esophageal mucosa to suggest esophageal perforation at that time. However, the patient was somewhat short of breath both before and after the procedure. She was placed on thin liquid fluid restrictions. On 2/7 a repeat chest CT was done which showed progression of her left-sided pleural effusion and some degree of progression of the pericardial effusion. Since admission, she has had a fever to 102, and an initial leukocytosis of 20,600, which has improved on antibiotic therapy (Zosyn) overnight to a level of 15,600. PCCM asked to evaluate left pleural effusion.   2/9 >> had thoracentesis by IR; states she's feeling better w/ less discomfort & SOB; able to ambulate on the ward.  Objective: Vital signs in last 24 hours: Filed Vitals:   10/20/11 1145 10/20/11 1355 10/20/11 2116  10/21/11 0457  BP: 108/66 108/65 123/79 115/79  Pulse: 95 92 94 94  Temp: 98.3 F (36.8 C) 98.4 F (36.9 C) 99.3 F (37.4 C) 98.7 F (37.1 C)  TempSrc: Oral Oral Oral Oral  Resp: 16 18 18 18   Height:      Weight:      SpO2: 99% 98% 97% 98%   Intake/Output from previous day:  Intake/Output Summary (Last 24 hours) at 10/21/11 1419 Last data filed at 10/21/11 1031  Gross per 24 hour  Intake    240 ml  Output   1800 ml  Net  -1560 ml    Medications: Scheduled Meds:    . piperacillin-tazobactam (ZOSYN)  IV  3.375 g Intravenous Q8H  . sodium chloride  3 mL Intravenous Q12H  . vancomycin  1,000 mg Intravenous Q8H  . DISCONTD: LORazepam  0.5 mg Oral Once   Continuous Infusions:    . DISCONTD: sodium chloride 100 mL/hr at 10/19/11 2114   PRN Meds:.acetaminophen, acetaminophen, albuterol, HYDROmorphone (DILAUDID) injection, LORazepam, methocarbamol(ROBAXIN) IV, ondansetron (ZOFRAN) IV, ondansetron  Physical Exam: Vital Signs:  Reviewed...  General:  WD, WN, [name] in NAD; alert & oriented; pleasant & cooperative... HEENT:  Neg- no oral lesions or exud seen. Neck:  Supple w/ full ROM; no JVD; normal carotid impulses w/o bruits; no thyromegaly or nodules palpated; no lymphadenopathy. Chest:  Sl decr BS w/ few scat rhonchi. Heart:  Regular Rhythm; norm S1 & S2 without murmurs, rubs, or gallops detected. Abdomen:  Soft & nontender- no guarding or rebound; normal bowel sounds; no organomegaly or masses palpated. Ext:  No cyanosis, clubbing, or edema. Neuro:  No  focal neuro deficits.   Lab Results:  Lab 10/20/11 0437 10/19/11 0550 10/18/11 1830  NA 138 136 136  K 3.9 3.9 3.6  CL 104 102 100  CO2 23 24 25   BUN 7 10 12   CREATININE 0.82 0.80 0.80  GLUCOSE 95 108* 97  ]  Lab 10/21/11 0605 10/20/11 0437 10/19/11 0550  HGB 8.1* 8.6* 9.1*  HCT 24.5* 26.1* 26.9*  WBC 11.2* 12.1* 15.6*  PLT 482* 456* 448*  }  Culture Results: Recent Results (from the past 240  hour(s))  URINE CULTURE     Status: Normal   Collection Time   10/18/11 10:46 PM      Component Value Range Status Comment   Specimen Description URINE, CLEAN CATCH   Final    Special Requests Normal   Final    Culture  Setup Time [id number]   Final    Colony Count 20,OOO COLONIES/ML   Final    Culture     Final    Value: Multiple bacterial morphotypes present, none predominant. Suggest appropriate recollection if clinically indicated.   Report Status 10/20/2011 FINAL   Final   CULTURE, BLOOD (ROUTINE X 2)     Status: Normal (Preliminary result)   Collection Time   10/18/11 11:20 PM      Component Value Range Status Comment   Specimen Description BLOOD LEFT HAND   Final    Special Requests BOTTLES DRAWN AEROBIC AND ANAEROBIC 3CC   Final    Culture  Setup Time [id number]   Final    Culture     Final    Value:        BLOOD CULTURE RECEIVED NO GROWTH TO DATE CULTURE WILL BE HELD FOR 5 DAYS BEFORE ISSUING A FINAL NEGATIVE REPORT   Report Status PENDING   Incomplete   CULTURE, BLOOD (ROUTINE X 2)     Status: Normal (Preliminary result)   Collection Time   10/18/11 11:25 PM      Component Value Range Status Comment   Specimen Description BLOOD RIGHT HAND   Final    Special Requests BOTTLES DRAWN AEROBIC AND ANAEROBIC 2.5CC   Final    Culture  Setup Time [id number]   Final    Culture     Final    Value:        BLOOD CULTURE RECEIVED NO GROWTH TO DATE CULTURE WILL BE HELD FOR 5 DAYS BEFORE ISSUING A FINAL NEGATIVE REPORT   Report Status PENDING   Incomplete   BODY FLUID CULTURE     Status: Normal (Preliminary result)   Collection Time   10/20/11  9:42 AM      Component Value Range Status Comment   Specimen Description PLEURAL   Final    Special Requests NONE   Final    Gram Stain PENDING   Incomplete    Culture NO GROWTH 1 DAY   Final    Report Status PENDING   Incomplete     Studies/Results: Dg Chest 1vsame Day  10/20/2011  *RADIOLOGY REPORT*  Clinical Data: Status post left  thoracentesis  CHEST - 1 VIEW SAME DAY  Comparison: CT chest dated 10/18/2011  Findings: Small left pleural effusion with associated left lower lobe opacity, atelectasis versus pneumonia.  No pneumothorax is seen.  Abnormal soft tissue in the lower right paramediastinum likely reflects fluid/debris within a dilated esophagus when correlating with CT.  The cardiomediastinal silhouette is mildly enlarged.  Visualized osseous structures are within normal limits.  IMPRESSION:  No pneumothorax is seen status post thoracentesis.  Small left pleural effusion with associated left lower lobe opacity, atelectasis versus pneumonia.  Fluid/debris within a dilated esophagus.  Original Report Authenticated By: Charline Bills, M.D.   US Thoracentesis Asp Pleural Space W/img Guide  10/20/2011  *RADIOLOGY REPORT*  Clinical Data: *RADIOLOGY REPORT*  Clinical Data:  Left pleural effusion  ULTRASOUND GUIDED left THORACENTESIS  Comparison:  None  An ultrasound guided thoracentesis was thoroughly discussed with the patient and questions answered.  The benefits, risks, alternatives and complications were also discussed.  The patient understands and wishes to proceed with the procedure.  Written consent was obtained.  Ultrasound was performed to localize and mark an adequate pocket of fluid in the left chest.  The area was then prepped and draped in the normal sterile fashion.  1% Lidocaine was used for local anesthesia.  Under ultrasound guidance a 19 gauge Yueh catheter was introduced.  Thoracentesis was performed.  The catheter was removed and a dressing applied.  Complications:  None  Findings: A total of approximately 600 ml of yellow fluid was removed. A fluid sample was sent for laboratory analysis.  IMPRESSION: Successful ultrasound guided left thoracentesis yielding 600 ml of pleural fluid.  Read by: Ralene Muskrat, P.A.-C  RADIOLOGY EXAMINATION  Comparison: None.  Findings:  IMPRESSION:  Original Report Authenticated By: Reola Calkins, M.D.    Assessment/Plan: Pneumonia vs atelectasis with left pleural effusion:   Agree w/ primary team concern for aspiration pna, doubt possibility of recent esophageal perf (radiology felt CT chest was reassuring that perf unlikely). In setting of fever, and given history would feel compelled to evaluate pleural fluid to r/o empyema vs para-pneumonic process.  recs  -IR guided thoracentesis w/ full fluid analysis, incl amylase ==> done 2/9 (see below) -cont IV antibiotics==> on Zosyn  -careful f/u CXRs==> plan f/u film 2/11  2/9> s/p ultrasound guided left thoracentesis this morning at ~9:30AM; 600cc of cloudy yellow fluid removed:    TProt=5.0 ==> c/w exudate...    LDH=146    Cell count & diff> 1259 WBC w/ 26%Neutrophils, 35%Lymphs, 39%Monos    Amylase=28    Cult & Cyto==> pending Post tap CXR showed No pneumothorax, Small left pleural effusion with associated left lower lobe opacity, atelectasis versus pneumonia. Fluid/debris within a dilated esophagus.  Other medical problems include:  Mild LVH & small pericard effusion on 2DEcho. Achalasia w/ debris filled esoph> on liq diet only per GI... Anemia> sl progressive decr in Hg... She had low Fe, low TIBC, elev Ferritin & haptoglobin.   Michele Mcalpine, MD 10/21/2011, 2:19 PM

## 2011-10-21 NOTE — Plan of Care (Signed)
Problem: Discharge Progression Outcomes Goal: Activity appropriate for discharge plan Outcome: Completed/Met Date Met:  10/21/11 Patient able to ambulate without assistance.

## 2011-10-22 ENCOUNTER — Inpatient Hospital Stay (HOSPITAL_COMMUNITY): Payer: PRIVATE HEALTH INSURANCE

## 2011-10-22 LAB — PATHOLOGIST SMEAR REVIEW

## 2011-10-22 LAB — ANA: Anti Nuclear Antibody(ANA): NEGATIVE

## 2011-10-22 MED ORDER — AMOXICILLIN-POT CLAVULANATE 400-57 MG/5ML PO SUSR
10.0000 mL | Freq: Two times a day (BID) | ORAL | Status: DC
  Administered 2011-10-22 – 2011-10-23 (×3): 10 mL via ORAL
  Filled 2011-10-22 (×2): qty 10

## 2011-10-22 NOTE — Progress Notes (Signed)
Inpatient Progress Note Patient ID: [name] MRN: [id number] DOB/AGE: 02-02-1971 41 y.o.  Subjective: 41 y.o. female with long-standing achalasia, status post previous Heller myotomy, admitted 2/7 with increaseddyspnea, orthopnea, pleuritis symptoms with some pain on deep breathing. These symptoms seem to have been more pronounced since she underwent endoscopy with Botox injection on January 28, which was performed because of severe dysphagia and food retention. She has been followed by GI and gen surgery in the out-pt setting w/ CT scan of the chest was obtained on January 31 which showed a small pleural effusion, minor atelectasis, and a small pericardial effusion. On February 5 she underwent repeat endoscopy by Buccini (She had apparently gotten substantial relief of her breathing after the endoscopy, Botox injection, and food disimpaction of the esophagus by Dr.Magod a week earlier.) On repeat endoscopy February 5, there was a large amount of food debris within the esophagus, which was able to be completely cleared out by a combination of suctioning and use of a International aid/development worker. There was no apparent injury to the esophageal mucosa to suggest esophageal perforation at that time. However, the patient was somewhat short of breath both before and after the procedure. She was placed on thin liquid fluid restrictions. On 2/7 a repeat chest CT was done which showed progression of her left-sided pleural effusion and some degree of progression of the pericardial effusion. Since admission, she has had a fever to 102, and an initial leukocytosis of 20,600, which has improved on antibiotic therapy (Zosyn) overnight to a level of 15,600. PCCM asked to evaluate left pleural effusion.   2/9 >> had thoracentesis by IR; states she's feeling better w/ less discomfort & SOB; able to ambulate on the ward   2/11 still cough immediately when supine, o/w comfortable  Objective: Vital signs in last 24  hours: Filed Vitals:   10/21/11 1408 10/21/11 2107 10/22/11 0512 10/22/11 1510  BP: 135/59 122/83 114/77 123/82  Pulse: 90 104 94 18  Temp: 98.8 F (37.1 C) 99.8 F (37.7 C) 99.1 F (37.3 C) 99.6 F (37.6 C)  TempSrc: Oral Oral Oral Oral  Resp: 18 20 20 18   Height: 6' (1.829 m)     Weight: 202 lb 2.6 oz (91.7 kg)     SpO2: 99% 97% 96% 97%   Intake/Output from previous day:  Intake/Output Summary (Last 24 hours) at 10/22/11 1535 Last data filed at 10/22/11 0639  Gross per 24 hour  Intake    250 ml  Output   1250 ml  Net  -1000 ml    Medications: Scheduled Meds:    . amoxicillin-clavulanate  10 mL Oral Q12H  . sodium chloride  3 mL Intravenous Q12H  . DISCONTD: piperacillin-tazobactam (ZOSYN)  IV  3.375 g Intravenous Q8H   Continuous Infusions:   PRN Meds:.acetaminophen, acetaminophen, albuterol, HYDROmorphone (DILAUDID) injection, LORazepam, methocarbamol(ROBAXIN) IV, ondansetron (ZOFRAN) IV, ondansetron  Physical Exam: Vital Signs:  BP 123/82  Pulse 18  Temp(Src) 99.6 F (37.6 C) (Oral)  Resp 18  Ht 6' (1.829 m)  Wt 202 lb 2.6 oz (91.7 kg)  BMI 27.42 kg/m2  SpO2 97%  LMP 10/18/2011  General:  WD, WN, 40 y/o BF in NAD; alert & oriented; pleasant & cooperative... HEENT:  Neg- no oral lesions or exud seen. Neck:  Supple w/ full ROM; no JVD; normal carotid impulses w/o bruits; no thyromegaly or nodules palpated; no lymphadenopathy. Chest:  Sl decr BS w/ few scat rhonchi. Heart:  Regular Rhythm; norm S1 &  S2 without murmurs, rubs, or gallops detected. Abdomen:  Soft & nontender- no guarding or rebound; normal bowel sounds; no organomegaly or masses palpated. Ext:  No cyanosis, clubbing, or edema. Neuro:  No focal neuro deficits.   Lab Results:  Lab 10/20/11 0437 10/19/11 0550 10/18/11 1830  NA 138 136 136  K 3.9 3.9 3.6  CL 104 102 100  CO2 23 24 25   BUN 7 10 12   CREATININE 0.82 0.80 0.80  GLUCOSE 95 108* 97  ]  Lab 10/21/11 0605 10/20/11 0437  10/19/11 0550  HGB 8.1* 8.6* 9.1*  HCT 24.5* 26.1* 26.9*  WBC 11.2* 12.1* 15.6*  PLT 482* 456* 448*  }  Culture Results: Recent Results (from the past 240 hour(s))  URINE CULTURE     Status: Normal   Collection Time   10/18/11 10:46 PM      Component Value Range Status Comment   Specimen Description URINE, CLEAN CATCH   Final    Special Requests Normal   Final    Culture  Setup Time [id number]   Final    Colony Count 20,OOO COLONIES/ML   Final    Culture     Final    Value: Multiple bacterial morphotypes present, none predominant. Suggest appropriate recollection if clinically indicated.   Report Status 10/20/2011 FINAL   Final   CULTURE, BLOOD (ROUTINE X 2)     Status: Normal (Preliminary result)   Collection Time   10/18/11 11:20 PM      Component Value Range Status Comment   Specimen Description BLOOD LEFT HAND   Final    Special Requests BOTTLES DRAWN AEROBIC AND ANAEROBIC 3CC   Final    Culture  Setup Time [id number]   Final    Culture     Final    Value:        BLOOD CULTURE RECEIVED NO GROWTH TO DATE CULTURE WILL BE HELD FOR 5 DAYS BEFORE ISSUING A FINAL NEGATIVE REPORT   Report Status PENDING   Incomplete   CULTURE, BLOOD (ROUTINE X 2)     Status: Normal (Preliminary result)   Collection Time   10/18/11 11:25 PM      Component Value Range Status Comment   Specimen Description BLOOD RIGHT HAND   Final    Special Requests BOTTLES DRAWN AEROBIC AND ANAEROBIC 2.5CC   Final    Culture  Setup Time [id number]   Final    Culture     Final    Value:        BLOOD CULTURE RECEIVED NO GROWTH TO DATE CULTURE WILL BE HELD FOR 5 DAYS BEFORE ISSUING A FINAL NEGATIVE REPORT   Report Status PENDING   Incomplete   BODY FLUID CULTURE     Status: Normal (Preliminary result)   Collection Time   10/20/11  9:42 AM      Component Value Range Status Comment   Specimen Description PLEURAL   Final    Special Requests NONE   Final    Gram Stain     Final    Value: WBC PRESENT,BOTH PMN AND  MONONUCLEAR     NO ORGANISMS SEEN   Culture NO GROWTH 2 DAYS   Final    Report Status PENDING   Incomplete   CULTURE, SPUTUM-ASSESSMENT     Status: Normal   Collection Time   10/21/11  7:18 PM      Component Value Range Status Comment   Specimen Description SPUTUM   Final  Special Requests Normal   Final    Sputum evaluation     Final    Value: THIS SPECIMEN IS ACCEPTABLE. RESPIRATORY CULTURE REPORT TO FOLLOW.   Report Status 10/21/2011 FINAL   Final     Studies/Results: Dg Chest 2 View  10/22/2011   IMPRESSION:  1.  Increased in volume of left pleural effusion.    Pulmonary over-read:  No significant free effusion noted  Assessment/Plan: Pneumonia vs atelectasis with left pleural effusion:   Agree w/ primary team concern for aspiration pna, doubt possibility of recent esophageal perf (radiology felt CT chest was reassuring that perf unlikely). In setting of fever, and given history would feel compelled to evaluate pleural fluid to r/o empyema vs para-pneumonic process.  recs  -IR guided thoracentesis   ==> done 2/9  See below -cont IV antibiotics==> on Zosyn 2/7 >>>    2/9> s/p ultrasound guided left thoracentesis this morning at ~9:30AM; 600cc of cloudy yellow fluid removed:    TProt=5.0 ==> c/w exudate...    LDH=146    Cell count & diff> 1259 WBC w/ 26%Neutrophils, 35%Lymphs, 39%Monos    Amylase=28    Cult & Cyto==> pending Post tap CXR showed No pneumothorax, Small left pleural effusion with associated left lower lobe opacity, atelectasis versus pneumonia. Fluid/debris within a dilated esophagus.  Other medical problems include:  Mild LVH & small pericard effusion on 2DEcho. Achalasia w/ debris filled esoph> on liq diet only per GI Immediate cough when lying down is to point of choking and typical of gerd/lpr, not small residual effusion/ atx   Anemia> sl progressive decr in Hg... She had low Fe, low TIBC, elev Ferritin & haptoglobin.   Sandrea Hughs, MD 10/22/2011,  3:35 PM  Nothing else to offer but empiric ABX (augmentin ok) with outpt f/u in one week but ideally all her care should be through a tertiary care center with experience in difficult achalasia if GI med/ GI surgery have run out of options because further more serious pulmnary complications are inevitable in this setting

## 2011-10-22 NOTE — Progress Notes (Signed)
PROGRESS NOTE  [name] UEA:[id number] DOB: 1970-09-14 DOA: 10/18/2011 PCP: Provider Not In System Gastroenterology: Charlott Rakes, M.D.  Brief narrative: 40yoF with h/o achalasia s/p Heller myotomy 1991 and multiple dilations, otherwise quite healthy, presents with fevers, pleuritic chest pain, and SOB and found to have LLL lung collapse, moderate left pleural effusion, pericardial effusion, mediastinal adenopathy, WBC 20 and worsened anemia.   She now presents because for the past 1-2 weeks, i.e. during the same time frame that she's had 2 EGD/botox injections, she feels chest pain with breathing or when she lays on her side, and feels she can't catch her breath. She notes a fever to 102 at the clinic the other day. She contacted outpt GI office and was referred to ED.   In the ED, pt tachycardic to 112. Normal chemistry panel. WBC 20.6 with normal diff but hypersegmented neutrophils noted; Hct was 27.6 which is down from 35.3 at end of Jan, MCV is 63, and plts are 508. CTA chest showed pericardial effusion that was minimally worse, moderate left pleural effusion that was worse, left lower lobe collapse that was worse, and a markedly dilated esophagus with fluid contents and air, but not significantly changed; also with mediastinal adenopathy and stranding that are worse.  Past medical history: Achalasia, GERD, heart murmur  Consultants:  Gastroenterology  Pulmonology  Procedures:  Prior to admission February 5: EGD with foreign body removal.  Prior to admission January 28: EGD with foreign body removal and Botox injection.  February 8: 2-D echocardiogram: Normal left ventricular ejection fraction. Small pericardial effusion not hemodynamically significant. Free-flowing.  February 8: Diagnostic and therapeutic pleurocentesis with evacuation of 600 mL fluid.  Antibiotics:  February 7: Zosyn  February 7: Vancomycin  Interim History: Interval documentation reviewed. Chest  x-ray shows increased left pleural effusion.  Subjective: Continues to feel better. Ambulating without difficulty. Breathing well.  Objective: Filed Vitals:   10/21/11 0457 10/21/11 1408 10/21/11 2107 10/22/11 0512  BP: 115/79 135/59 122/83 114/77  Pulse: 94 90 104 94  Temp: 98.7 F (37.1 C) 98.8 F (37.1 C) 99.8 F (37.7 C) 99.1 F (37.3 C)  TempSrc: Oral Oral Oral Oral  Resp: 18 18 20 20   Height:  6' (1.829 m)    Weight:  91.7 kg (202 lb 2.6 oz)    SpO2: 98% 99% 97% 96%    Intake/Output Summary (Last 24 hours) at 10/22/11 1024 Last data filed at 10/22/11 4782  Gross per 24 hour  Intake    490 ml  Output   2050 ml  Net  -1560 ml    Exam:   General:  Appears calm and comfortable.  Cardiovascular: Regular rate and rhythm. No murmur, rub, gallop. No significant lower extremity edema.  Respiratory: Improved breath sounds, without breath sounds left posterior are diminished. Otherwise clear to auscultation bilaterally. No frank wheezes rales or rhonchi. Normal work of breathing.  Data Reviewed: Basic Metabolic Panel:  Lab 10/20/11 9562 10/19/11 0550 10/18/11 1830  NA 138 136 136  K 3.9 3.9 --  CL 104 102 100  CO2 23 24 25   GLUCOSE 95 108* 97  BUN 7 10 12   CREATININE 0.82 0.80 0.80  CALCIUM 9.1 8.9 9.5  MG -- -- --  PHOS -- -- --   CBC:  Lab 10/21/11 0605 10/20/11 0437 10/19/11 0550 10/18/11 1830  WBC 11.2* 12.1* 15.6* 20.6*  NEUTROABS -- -- -- 15.9*  HGB 8.1* 8.6* 9.1* 9.2*  HCT 24.5* 26.1* 26.9* 27.6*  MCV  63.5* 63.2* 63.0* 63.2*  PLT 482* 456* 448* 508*   Studies: Ct Angio Chest W/cm &/or Wo Cm  10/18/2011  *RADIOLOGY REPORT*  Clinical Data: Short of breath  CT ANGIOGRAPHY CHEST  Technique:  Multidetector CT imaging of the chest using the standard protocol during bolus administration of intravenous contrast. Multiplanar reconstructed images including MIPs were obtained and reviewed to evaluate the vascular anatomy.  Contrast: 75mL OMNIPAQUE IOHEXOL 300  MG/ML IV SOLN  Comparison: 10/12/2011  Findings: Respiratory motion limits the study.  No obvious filling defects in the pulmonary arterial tree.  Pericardial effusion minimally worse.  Moderate left pleural effusion is worse.  Left lower lobe collapse is worse.  The esophagus remains markedly dilated containing fluid contents and air.  This is not significantly changed.  Mediastinal stranding and adenopathy is worse.  Prevascular 11 mm short axis diameter node on image 34.  Other adjacent smaller nodes.  No pneumothorax.  Otherwise stable exam.  IMPRESSION: Compared the prior study, the pericardial effusion, left pleural effusion, left lower lobe volume loss, and mediastinal adenopathy with stranding are all worse.  These findings suggest a worsening inflammatory process.  The no evidence of acute pulmonary thromboembolism.  Original Report Authenticated By: Donavan Burnet, M.D.    Scheduled Meds:    . piperacillin-tazobactam (ZOSYN)  IV  3.375 g Intravenous Q8H  . sodium chloride  3 mL Intravenous Q12H  . DISCONTD: vancomycin  1,000 mg Intravenous Q8H   Continuous Infusions:     Assessment/Plan: 1. Sepsis: Resolved. Secondary to pneumonia. Oxygenation stable. Leukocytosis improved. 2. Left lower lung collapse/pleural effusion: Continues to improve clinically however chest x-ray reveals increased diffusion. Aspiration pneumonia is favored. Change to oral antibiotics. Differential includes autoimmune/rheumatologic disorder; procedural trauma is doubted. Nontoxic.  Followup cultures. Appreciate consultants' evaluation and care.  3. Pericardial effusion: Appears to be clinically insignificant by 2-D echocardiogram. 4. Microcytic anemia: Patient is unaware of this diagnosis. She is followed in the outpatient setting by gastroenterology. Would defer further evaluation to gastroenterology. Continue to follow for now. 5. Achalasia: Close outpatient followup with general surgery for redo  myotomy.  Overall improving. Will followup pulmonology recommendations. Hopeful for discharge February 12.  Code Status: Full code Family Communication: Discussed with family at bedside Disposition Plan: Pending further evaluation   Brendia Sacks, MD  [facility] Pager [phone] 10/22/2011, 10:24 AM    LOS: 4 days

## 2011-10-23 LAB — BODY FLUID CULTURE: Culture: NO GROWTH

## 2011-10-23 MED ORDER — LORAZEPAM 2 MG/ML PO CONC: 0.5000 mg | Freq: Four times a day (QID) | ORAL | Status: DC | PRN

## 2011-10-23 MED ORDER — AMOXICILLIN-POT CLAVULANATE 400-57 MG/5ML PO SUSR: 10.0000 mL | Freq: Two times a day (BID) | ORAL | Status: AC

## 2011-10-23 MED ORDER — LORAZEPAM 2 MG/ML PO CONC: 0.5000 mg | Freq: Four times a day (QID) | ORAL | Status: AC | PRN

## 2011-10-23 NOTE — Plan of Care (Signed)
Problem: Discharge Progression Outcomes Goal: Complications resolved/controlled Outcome: Completed/Met Date Met:  10/23/11 Pt tolerating clear liquid diet

## 2011-10-23 NOTE — Progress Notes (Signed)
PROGRESS NOTE  JENNYLEE UEHARA ZOX:[id number] DOB: 10/10/1970 DOA: 10/18/2011 PCP: Provider Not In System Gastroenterology: Charlott Rakes, M.D.  Brief narrative: 40yoF with h/o achalasia s/p Heller myotomy 1991 and multiple dilations, otherwise quite healthy, presents with fevers, pleuritic chest pain, and SOB and found to have LLL lung collapse, moderate left pleural effusion, pericardial effusion, mediastinal adenopathy, WBC 20 and worsened anemia.   She now presents because for the past 1-2 weeks, i.e. during the same time frame that she's had 2 EGD/botox injections, she feels chest pain with breathing or when she lays on her side, and feels she can't catch her breath. She notes a fever to 102 at the clinic the other day. She contacted outpt GI office and was referred to ED.   In the ED, pt tachycardic to 112. Normal chemistry panel. WBC 20.6 with normal diff but hypersegmented neutrophils noted; Hct was 27.6 which is down from 35.3 at end of Jan, MCV is 63, and plts are 508. CTA chest showed pericardial effusion that was minimally worse, moderate left pleural effusion that was worse, left lower lobe collapse that was worse, and a markedly dilated esophagus with fluid contents and air, but not significantly changed; also with mediastinal adenopathy and stranding that are worse.  Past medical history: Achalasia, GERD, heart murmur  Consultants:  Gastroenterology  Pulmonology  Procedures:  Prior to admission February 5: EGD with foreign body removal.  Prior to admission January 28: EGD with foreign body removal and Botox injection.  February 8: 2-D echocardiogram: Normal left ventricular ejection fraction. Small pericardial effusion not hemodynamically significant. Free-flowing.  February 8: Diagnostic and therapeutic pleurocentesis with evacuation of 600 mL fluid.  Antibiotics:  February 7: Zosyn  February 7: Vancomycin  Interim History: Interval documentation reviewed. Cleared  for discharge per pulmonology.  Subjective: Continues to feel better.   Objective: Filed Vitals:   10/22/11 0512 10/22/11 1510 10/22/11 2042 10/23/11 0455  BP: 114/77 123/82 124/74 125/83  Pulse: 94 18 97 100  Temp: 99.1 F (37.3 C) 99.6 F (37.6 C) 99.5 F (37.5 C) 99.1 F (37.3 C)  TempSrc: Oral Oral Oral Oral  Resp: 20 18 17 17   Height:      Weight:      SpO2: 96% 97% 100% 97%    Intake/Output Summary (Last 24 hours) at 10/23/11 1023 Last data filed at 10/23/11 0500  Gross per 24 hour  Intake    600 ml  Output   2000 ml  Net  -1400 ml    Exam:   General:  Appears calm and comfortable.  Cardiovascular: Regular rate and rhythm. No murmur, rub, gallop.   Respiratory: Generally clear to auscultation bilaterally. No frank wheezes rales or rhonchi. Normal work of breathing.  Data Reviewed: Basic Metabolic Panel:  Lab 10/20/11 8119 10/19/11 0550 10/18/11 1830  NA 138 136 136  K 3.9 3.9 --  CL 104 102 100  CO2 23 24 25   GLUCOSE 95 108* 97  BUN 7 10 12   CREATININE 0.82 0.80 0.80  CALCIUM 9.1 8.9 9.5  MG -- -- --  PHOS -- -- --   CBC:  Lab 10/21/11 0605 10/20/11 0437 10/19/11 0550 10/18/11 1830  WBC 11.2* 12.1* 15.6* 20.6*  NEUTROABS -- -- -- 15.9*  HGB 8.1* 8.6* 9.1* 9.2*  HCT 24.5* 26.1* 26.9* 27.6*  MCV 63.5* 63.2* 63.0* 63.2*  PLT 482* 456* 448* 508*   Studies: Ct Angio Chest W/cm &/or Wo Cm  10/18/2011  *RADIOLOGY REPORT*  Clinical  Data: Short of breath  CT ANGIOGRAPHY CHEST  Technique:  Multidetector CT imaging of the chest using the standard protocol during bolus administration of intravenous contrast. Multiplanar reconstructed images including MIPs were obtained and reviewed to evaluate the vascular anatomy.  Contrast: 75mL OMNIPAQUE IOHEXOL 300 MG/ML IV SOLN  Comparison: 10/12/2011  Findings: Respiratory motion limits the study.  No obvious filling defects in the pulmonary arterial tree.  Pericardial effusion minimally worse.  Moderate left pleural  effusion is worse.  Left lower lobe collapse is worse.  The esophagus remains markedly dilated containing fluid contents and air.  This is not significantly changed.  Mediastinal stranding and adenopathy is worse.  Prevascular 11 mm short axis diameter node on image 34.  Other adjacent smaller nodes.  No pneumothorax.  Otherwise stable exam.  IMPRESSION: Compared the prior study, the pericardial effusion, left pleural effusion, left lower lobe volume loss, and mediastinal adenopathy with stranding are all worse.  These findings suggest a worsening inflammatory process.  The no evidence of acute pulmonary thromboembolism.  Original Report Authenticated By: Donavan Burnet, M.D.    Scheduled Meds:    . amoxicillin-clavulanate  10 mL Oral Q12H  . sodium chloride  3 mL Intravenous Q12H  . DISCONTD: piperacillin-tazobactam (ZOSYN)  IV  3.375 g Intravenous Q8H   Continuous Infusions:     Assessment/Plan: 1. Sepsis: Resolved. Secondary to pneumonia. Oxygenation stable. Leukocytosis improved. 2. Left lower lung collapse/pleural effusion: Aspiration pneumonia is favored. Continue oral antibiotics. Cultures thus far unrevealing. Appreciate consultants' evaluation and care.  3. Pericardial effusion: Appears to be clinically insignificant by 2-D echocardiogram. 4. Microcytic anemia: Patient is unaware of this diagnosis. She is followed in the outpatient setting by gastroenterology. Would defer further evaluation to gastroenterology.  5. Achalasia: Close outpatient followup with general surgery for redo myotomy.   Code Status: Full code Family Communication: Discussed with family at bedside Disposition Plan: Home today.   Brendia Sacks, MD  [facility] Pager [phone] 10/23/2011, 10:23 AM    LOS: 5 days

## 2011-10-23 NOTE — Discharge Summary (Signed)
Physician Discharge Summary  [name] ZOX:[id number] DOB: August 31, 1971 DOA: 10/18/2011  PCP: None Gastroenterologist: Charlott Rakes, M.D. General surgeon: Luretha Murphy, M.D.  Admit date: 10/18/2011 Discharge date: 10/23/2011  Discharge Diagnoses:  1. Sepsis secondary to aspiration pneumonia 2. Aspiration pneumonia 3. Achalasia 4. Microcytic anemia 5. Clinically insignificant pericardial effusion  Discharge Condition: Improved  Disposition: Home  History of present illness:  40yoF with h/o achalasia s/p Heller myotomy 1991 and multiple dilations, otherwise quite healthy, presented with fever, pleuritic chest pain, and SOB and found to have LLL lung collapse, moderate left pleural effusion, pericardial effusion, mediastinal adenopathy, WBC 20 and worsened anemia.   Hospital Course:  [name] was admitted to medical floor and placed on broad-spectrum antibiotics. Aspiration pneumonia quickly improved and respiratory status stabilized. Etiology of her aspiration is achalasia. She was seen in consultation with gastroenterology and pulmonology. She underwent thoracentesis for diagnostic and therapeutic purposes. Thus far diagnostic testing unrevealing. She is markedly improved on antibiotics and will complete a course in the outpatient setting. She has been cleared for discharge by gastroenterology and pulmonology. Dr. Luretha Murphy saw her in the hospital yesterday and plans on surgery in 2-3 weeks. These and other issues as outlined below. The case was discussed with Dr. Bosie Clos yesterday by telephone. 1. Sepsis: Resolved. Secondary to pneumonia.  2. Left lower lung collapse/pleural effusion: Aspiration pneumonia is favored. Continue oral antibiotics. Cultures thus far unrevealing.    3. Pericardial effusion: Appears to be clinically insignificant by 2-D echocardiogram.  4. Microcytic anemia: Patient is unaware of this diagnosis. She is followed in the outpatient setting by  gastroenterology. Would defer further evaluation to gastroenterology.    5. Achalasia: Close outpatient followup with general surgery for redo myotomy.  Consultants:  Gastroenterology   Pulmonology  Procedures:  Prior to admission February 5: EGD with foreign body removal.   Prior to admission January 28: EGD with foreign body removal and Botox injection.   February 8: 2-D echocardiogram: Normal left ventricular ejection fraction. Small pericardial effusion not hemodynamically significant. Free-flowing.   February 8: Diagnostic and therapeutic pleurocentesis with evacuation of 600 mL fluid.  Discharge Instructions  Discharge Orders    Future Orders Please Complete By Expires   Activity as tolerated - No restrictions      Discharge instructions      Comments:   Continue liquid diet. Followup with general surgery as scheduled. Suggest establishing with primary care physician.     Medication List  As of 10/23/2011 10:39 AM   TAKE these medications         amoxicillin-clavulanate 400-57 MG/5ML suspension   Commonly known as: AUGMENTIN   Take 10 mLs by mouth every 12 (twelve) hours. Last dose February 16 p.m.      LORazepam 2 MG/ML concentrated solution   Commonly known as: ATIVAN   Take 0.3 mLs (0.6 mg total) by mouth every 6 (six) hours as needed.           Follow-up Information    Follow up with MARTIN,MATTHEW B, MD in 2 weeks. (As directed)    Contact information:   [employer] [address] [place] [address] [phone]       Follow up with Shirley Friar., MD. (As needed)    Contact information:   [address] [employer], [place]. [place] [address] [phone]           The results of significant diagnostics  from this hospitalization (including imaging, microbiology, ancillary and laboratory) are listed below for reference.    Significant  Diagnostic Studies: Dg Chest 2 View  10/22/2011  *RADIOLOGY REPORT*  Clinical Data: Follow up effusion  CHEST - 2 VIEW  Comparison: 10/20/2011  Findings: Stable cardiac enlargement.  Interval increase in volume of the left pleural effusion with overlying atelectasis.  Markedly distended esophagus is again noted, unchanged from previous exam.  IMPRESSION:  1.  Increased in volume of left pleural effusion.  Original Report Authenticated By: Rosealee Albee, M.D.   Dg Chest 2 View  10/08/2011  *RADIOLOGY REPORT*  Clinical Data: Chest pain.  History of surgery for achalasia.  CHEST - 2 VIEW  Comparison: CT chest and chest radiograph 05/15/2011.  Findings: Trachea is midline.  Heart is enlarged.  Dilated and debris-filled esophagus is seen in the medial right hemithorax, as before.  Mild interstitial prominence.  Bibasilar air space disease is noted as well, with tiny bilateral pleural effusions.  IMPRESSION: Suspect mild congestive heart failure with bibasilar air space disease.  Original Report Authenticated By: Reyes Ivan, M.D.   Ct Chest W Contrast  10/12/2011  *RADIOLOGY REPORT*  Clinical Data: Achalasia.  Chest pain.  Shortness of breath. Fever.  CT CHEST WITH CONTRAST  Technique:  Multidetector CT imaging of the chest was performed following the standard protocol during bolus administration of intravenous contrast.  Contrast: 80mL OMNIPAQUE IOHEXOL 300 MG/ML IV SOLN  Comparison: 05/15/2011  Findings: There is diffuse dilation of the esophagus down to the level of the esophagogastric junction.  The esophagus is filled with food debris.  The degree of esophageal dilatation and similar to the previous study.  No axillary lymphadenopathy.  Scattered small lymph nodes in the prevascular space are stable.  No mediastinal or hilar lymphadenopathy.  The heart is enlarged.  There is a small pericardial effusion.  Tiny left pleural effusion is noted.  The lungs are clear other than some subsegmental  atelectasis in the lower lobes.  Bone windows reveal no worrisome lytic or sclerotic osseous lesions.  IMPRESSION: Diffuse esophageal dilatation with esophageal lumen filled with food debris.  Imaging features are not substantially changed compared the prior study.  Findings are consistent with the reported history of achalasia.  Original Report Authenticated By: ERIC A. MANSELL, M.D.   Ct Angio Chest W/cm &/or Wo Cm  10/18/2011  *RADIOLOGY REPORT*  Clinical Data: Short of breath  CT ANGIOGRAPHY CHEST  Technique:  Multidetector CT imaging of the chest using the standard protocol during bolus administration of intravenous contrast. Multiplanar reconstructed images including MIPs were obtained and reviewed to evaluate the vascular anatomy.  Contrast: 75mL OMNIPAQUE IOHEXOL 300 MG/ML IV SOLN  Comparison: 10/12/2011  Findings: Respiratory motion limits the study.  No obvious filling defects in the pulmonary arterial tree.  Pericardial effusion minimally worse.  Moderate left pleural effusion is worse.  Left lower lobe collapse is worse.  The esophagus remains markedly dilated containing fluid contents and air.  This is not significantly changed.  Mediastinal stranding and adenopathy is worse.  Prevascular 11 mm short axis diameter node on image 34.  Other adjacent smaller nodes.  No pneumothorax.  Otherwise stable exam.  IMPRESSION: Compared the prior study, the pericardial effusion, left pleural effusion, left lower lobe volume loss, and mediastinal adenopathy with stranding are all worse.  These findings suggest a worsening inflammatory process.  The no evidence of acute pulmonary thromboembolism.  Original Report Authenticated By: Donavan Burnet, M.D.  Dg Chest 1vsame Day  10/20/2011  *RADIOLOGY REPORT*  Clinical Data: Status post left thoracentesis  CHEST - 1 VIEW SAME DAY  Comparison: CT chest dated 10/18/2011  Findings: Small left pleural effusion with associated left lower lobe opacity, atelectasis versus  pneumonia.  No pneumothorax is seen.  Abnormal soft tissue in the lower right paramediastinum likely reflects fluid/debris within a dilated esophagus when correlating with CT.  The cardiomediastinal silhouette is mildly enlarged.  Visualized osseous structures are within normal limits.  IMPRESSION: No pneumothorax is seen status post thoracentesis.  Small left pleural effusion with associated left lower lobe opacity, atelectasis versus pneumonia.  Fluid/debris within a dilated esophagus.  Original Report Authenticated By: Charline Bills, M.D.   US Thoracentesis Asp Pleural Space W/img Guide  10/20/2011  *RADIOLOGY REPORT*  Clinical Data: *RADIOLOGY REPORT*  Clinical Data:  Left pleural effusion  ULTRASOUND GUIDED left THORACENTESIS  Comparison:  None  An ultrasound guided thoracentesis was thoroughly discussed with the patient and questions answered.  The benefits, risks, alternatives and complications were also discussed.  The patient understands and wishes to proceed with the procedure.  Written consent was obtained.  Ultrasound was performed to localize and mark an adequate pocket of fluid in the left chest.  The area was then prepped and draped in the normal sterile fashion.  1% Lidocaine was used for local anesthesia.  Under ultrasound guidance a 19 gauge Yueh catheter was introduced.  Thoracentesis was performed.  The catheter was removed and a dressing applied.  Complications:  None  Findings: A total of approximately 600 ml of yellow fluid was removed. A fluid sample was sent for laboratory analysis.  IMPRESSION: Successful ultrasound guided left thoracentesis yielding 600 ml of pleural fluid.  Read by: Ralene Muskrat, P.A.-C  RADIOLOGY EXAMINATION  Comparison: None.  Findings:  IMPRESSION:  Original Report Authenticated By: Reola Calkins, M.D.    Microbiology: Recent Results (from the past 240 hour(s))  URINE CULTURE     Status: Normal   Collection Time   10/18/11 10:46 PM      Component Value  Range Status Comment   Specimen Description URINE, CLEAN CATCH   Final    Special Requests Normal   Final    Culture  Setup Time [id number]   Final    Colony Count 20,OOO COLONIES/ML   Final    Culture     Final    Value: Multiple bacterial morphotypes present, none predominant. Suggest appropriate recollection if clinically indicated.   Report Status 10/20/2011 FINAL   Final   CULTURE, BLOOD (ROUTINE X 2)     Status: Normal (Preliminary result)   Collection Time   10/18/11 11:20 PM      Component Value Range Status Comment   Specimen Description BLOOD LEFT HAND   Final    Special Requests BOTTLES DRAWN AEROBIC AND ANAEROBIC 3CC   Final    Culture  Setup Time [id number]   Final    Culture     Final    Value:        BLOOD CULTURE RECEIVED NO GROWTH TO DATE CULTURE WILL BE HELD FOR 5 DAYS BEFORE ISSUING A FINAL NEGATIVE REPORT   Report Status PENDING   Incomplete   CULTURE, BLOOD (ROUTINE X 2)     Status: Normal (Preliminary result)   Collection Time   10/18/11 11:25 PM      Component Value Range Status Comment   Specimen Description BLOOD RIGHT HAND   Final  Special Requests BOTTLES DRAWN AEROBIC AND ANAEROBIC 2.5CC   Final    Culture  Setup Time [id number]   Final    Culture     Final    Value:        BLOOD CULTURE RECEIVED NO GROWTH TO DATE CULTURE WILL BE HELD FOR 5 DAYS BEFORE ISSUING A FINAL NEGATIVE REPORT   Report Status PENDING   Incomplete   BODY FLUID CULTURE     Status: Normal (Preliminary result)   Collection Time   10/20/11  9:42 AM      Component Value Range Status Comment   Specimen Description PLEURAL   Final    Special Requests NONE   Final    Gram Stain     Final    Value: WBC PRESENT,BOTH PMN AND MONONUCLEAR     NO ORGANISMS SEEN   Culture NO GROWTH 2 DAYS   Final    Report Status PENDING   Incomplete   CULTURE, SPUTUM-ASSESSMENT     Status: Normal   Collection Time   10/21/11  7:18 PM      Component Value Range Status Comment   Specimen Description  SPUTUM   Final    Special Requests Normal   Final    Sputum evaluation     Final    Value: THIS SPECIMEN IS ACCEPTABLE. RESPIRATORY CULTURE REPORT TO FOLLOW.   Report Status 10/21/2011 FINAL   Final   CULTURE, RESPIRATORY     Status: Normal (Preliminary result)   Collection Time   10/21/11  7:18 PM      Component Value Range Status Comment   Specimen Description SPUTUM   Final    Special Requests NONE   Final    Gram Stain     Final    Value: RARE WBC PRESENT, PREDOMINANTLY PMN     FEW SQUAMOUS EPITHELIAL CELLS PRESENT     RARE GRAM POSITIVE COCCI IN PAIRS AND CHAINS     IN CLUSTERS   Culture NORMAL OROPHARYNGEAL FLORA   Final    Report Status PENDING   Incomplete      Labs: Basic Metabolic Panel:  Lab 10/20/11 6578 10/19/11 0550 10/18/11 1830  NA 138 136 136  K 3.9 3.9 --  CL 104 102 100  CO2 23 24 25   GLUCOSE 95 108* 97  BUN 7 10 12   CREATININE 0.82 0.80 0.80  CALCIUM 9.1 8.9 9.5  MG -- -- --  PHOS -- -- --   CBC:  Lab 10/21/11 0605 10/20/11 0437 10/19/11 0550 10/18/11 1830  WBC 11.2* 12.1* 15.6* 20.6*  NEUTROABS -- -- -- 15.9*  HGB 8.1* 8.6* 9.1* 9.2*  HCT 24.5* 26.1* 26.9* 27.6*  MCV 63.5* 63.2* 63.0* 63.2*  PLT 482* 456* 448* 508*    Time coordinating discharge: 20 minutes.  Signed:  Brendia Sacks, MD  [facility] 10/23/2011, 10:39 AM

## 2011-10-23 NOTE — Progress Notes (Signed)
Pt given discharge instructions regarding medications/prescriptions, hospital treatments for pleural effusions, and following care with GI and [facility]. Paperwork signed and given to pt. Pt verbalizes understanding and has no questions. Currently pt awaiting husbands arrival to be taken home. Pt stable for discharge and dressed waiting in chair. Told to notify staff when husband arrives. [place], [employer]

## 2011-10-24 ENCOUNTER — Other Ambulatory Visit (INDEPENDENT_AMBULATORY_CARE_PROVIDER_SITE_OTHER): Payer: Self-pay | Admitting: General Surgery

## 2011-10-24 DIAGNOSIS — K220 Achalasia of cardia: Secondary | ICD-10-CM

## 2011-10-24 LAB — CULTURE, RESPIRATORY W GRAM STAIN: Culture: NORMAL

## 2011-10-25 ENCOUNTER — Telehealth (INDEPENDENT_AMBULATORY_CARE_PROVIDER_SITE_OTHER): Payer: Self-pay

## 2011-10-25 ENCOUNTER — Encounter (INDEPENDENT_AMBULATORY_CARE_PROVIDER_SITE_OTHER): Payer: PRIVATE HEALTH INSURANCE | Admitting: Surgery

## 2011-10-25 LAB — CULTURE, BLOOD (ROUTINE X 2)
Culture  Setup Time: 201302080323
Culture  Setup Time: 201302080323
Culture: NO GROWTH
Culture: NO GROWTH

## 2011-10-25 NOTE — Telephone Encounter (Signed)
Per Dr Daphine Deutscher call in [facility]  200cc take 5cc po q4-6hrs prn pain with 1 refill.  I called it to [facility].  Pt aware.

## 2011-10-25 NOTE — Telephone Encounter (Signed)
The patient states she was discharged Tuesday.  She is having a burning pain in her esophagus that feels like it is on fire when she inhales and exhales.  She states when she gets up to walk she has a rapid heartbeat and gets short of breath.  She vomited twice yesterday.  She states mixing milk and ensure makes her sick.  She feels like there is mucus in her esophagus.  She would like something for pain because she isn't sure if she can take it until March 5th surgery date.  Her pharmacy is [facility] on [address].

## 2011-10-26 ENCOUNTER — Ambulatory Visit (HOSPITAL_COMMUNITY)
Admission: RE | Admit: 2011-10-26 | Discharge: 2011-10-26 | Disposition: A | Discharge status: Home / Self Care | Payer: PRIVATE HEALTH INSURANCE | Source: Ambulatory Visit | Attending: Surgery | Admitting: Surgery

## 2011-10-26 DIAGNOSIS — K220 Achalasia of cardia: Secondary | ICD-10-CM | POA: Insufficient documentation

## 2011-10-27 ENCOUNTER — Inpatient Hospital Stay (HOSPITAL_COMMUNITY)
Admission: EM | Admit: 2011-10-27 | Discharge: 2011-11-09 | DRG: 982 | Disposition: A | Discharge status: Home / Self Care | Payer: PRIVATE HEALTH INSURANCE | Attending: Surgery | Admitting: Surgery

## 2011-10-27 ENCOUNTER — Encounter (HOSPITAL_COMMUNITY): Payer: Self-pay | Admitting: *Deleted

## 2011-10-27 ENCOUNTER — Other Ambulatory Visit: Payer: Self-pay

## 2011-10-27 ENCOUNTER — Emergency Department (HOSPITAL_COMMUNITY): Payer: PRIVATE HEALTH INSURANCE

## 2011-10-27 DIAGNOSIS — J9819 Other pulmonary collapse: Secondary | ICD-10-CM | POA: Diagnosis present

## 2011-10-27 DIAGNOSIS — R7989 Other specified abnormal findings of blood chemistry: Secondary | ICD-10-CM | POA: Diagnosis present

## 2011-10-27 DIAGNOSIS — T18108A Unspecified foreign body in esophagus causing other injury, initial encounter: Secondary | ICD-10-CM | POA: Diagnosis not present

## 2011-10-27 DIAGNOSIS — D509 Iron deficiency anemia, unspecified: Secondary | ICD-10-CM | POA: Diagnosis present

## 2011-10-27 DIAGNOSIS — Z9889 Other specified postprocedural states: Secondary | ICD-10-CM

## 2011-10-27 DIAGNOSIS — R1310 Dysphagia, unspecified: Secondary | ICD-10-CM | POA: Diagnosis present

## 2011-10-27 DIAGNOSIS — J690 Pneumonitis due to inhalation of food and vomit: Principal | ICD-10-CM | POA: Diagnosis present

## 2011-10-27 DIAGNOSIS — D75839 Thrombocytosis, unspecified: Secondary | ICD-10-CM | POA: Diagnosis present

## 2011-10-27 DIAGNOSIS — R112 Nausea with vomiting, unspecified: Secondary | ICD-10-CM | POA: Diagnosis present

## 2011-10-27 DIAGNOSIS — J9811 Atelectasis: Secondary | ICD-10-CM | POA: Diagnosis present

## 2011-10-27 DIAGNOSIS — K220 Achalasia of cardia: Secondary | ICD-10-CM | POA: Diagnosis present

## 2011-10-27 DIAGNOSIS — IMO0002 Reserved for concepts with insufficient information to code with codable children: Secondary | ICD-10-CM | POA: Diagnosis not present

## 2011-10-27 DIAGNOSIS — D473 Essential (hemorrhagic) thrombocythemia: Secondary | ICD-10-CM | POA: Diagnosis present

## 2011-10-27 DIAGNOSIS — R6510 Systemic inflammatory response syndrome (SIRS) of non-infectious origin without acute organ dysfunction: Secondary | ICD-10-CM | POA: Diagnosis present

## 2011-10-27 DIAGNOSIS — J90 Pleural effusion, not elsewhere classified: Secondary | ICD-10-CM | POA: Diagnosis present

## 2011-10-27 DIAGNOSIS — D72829 Elevated white blood cell count, unspecified: Secondary | ICD-10-CM | POA: Diagnosis present

## 2011-10-27 LAB — COMPREHENSIVE METABOLIC PANEL
ALT: 52 U/L — ABNORMAL HIGH (ref 0–35)
AST: 45 U/L — ABNORMAL HIGH (ref 0–37)
Albumin: 3.2 g/dL — ABNORMAL LOW (ref 3.5–5.2)
Alkaline Phosphatase: 80 U/L (ref 39–117)
BUN: 10 mg/dL (ref 6–23)
CO2: 27 mEq/L (ref 19–32)
Calcium: 9.5 mg/dL (ref 8.4–10.5)
Chloride: 101 mEq/L (ref 96–112)
Creatinine, Ser: 0.76 mg/dL (ref 0.50–1.10)
GFR calc Af Amer: >90 mL/min (ref >90)
GFR calc non Af Amer: >90 mL/min (ref >90)
Glucose, Bld: 99 mg/dL (ref 70–99)
Potassium: 3.6 mEq/L (ref 3.5–5.1)
Sodium: 138 mEq/L (ref 135–145)
Total Bilirubin: 0.3 mg/dL (ref 0.3–1.2)
Total Protein: 8.3 g/dL (ref 6.0–8.3)

## 2011-10-27 LAB — CBC
HCT: 26.3 % — ABNORMAL LOW (ref 36.0–46.0)
Hemoglobin: 8.5 g/dL — ABNORMAL LOW (ref 12.0–15.0)
MCH: 20.3 pg — ABNORMAL LOW (ref 26.0–34.0)
MCHC: 32.3 g/dL (ref 30.0–36.0)
MCV: 62.8 fL — ABNORMAL LOW (ref 78.0–100.0)
Platelets: 574 10*3/uL — ABNORMAL HIGH (ref 150–400)
RBC: 4.19 MIL/uL (ref 3.87–5.11)
RDW: 15.2 % (ref 11.5–15.5)
WBC: 16.8 10*3/uL — ABNORMAL HIGH (ref 4.0–10.5)

## 2011-10-27 LAB — D-DIMER, QUANTITATIVE: D-Dimer, Quant: 10.7 ug/mL-FEU — ABNORMAL HIGH (ref 0.00–0.48)

## 2011-10-27 LAB — DIFFERENTIAL
Basophils Absolute: 0 10*3/uL (ref 0.0–0.1)
Basophils Relative: 0 % (ref 0–1)
Eosinophils Absolute: 0 10*3/uL (ref 0.0–0.7)
Eosinophils Relative: 0 % (ref 0–5)
Lymphocytes Relative: 12 % (ref 12–46)
Lymphs Abs: 2 10*3/uL (ref 0.7–4.0)
Monocytes Absolute: 1 10*3/uL (ref 0.1–1.0)
Monocytes Relative: 6 % (ref 3–12)
Neutro Abs: 13.8 10*3/uL — ABNORMAL HIGH (ref 1.7–7.7)
Neutrophils Relative %: 82 % — ABNORMAL HIGH (ref 43–77)

## 2011-10-27 LAB — POCT I-STAT TROPONIN I: Troponin i, poc: 0.01 ng/mL (ref 0.00–0.08)

## 2011-10-27 MED ORDER — HYDROMORPHONE HCL PF 1 MG/ML IJ SOLN
1.0000 mg | Freq: Once | INTRAMUSCULAR | Status: AC
  Administered 2011-10-27: 1 mg via INTRAVENOUS
  Filled 2011-10-27: qty 1

## 2011-10-27 MED ORDER — ONDANSETRON HCL 4 MG/2ML IJ SOLN
4.0000 mg | Freq: Once | INTRAMUSCULAR | Status: AC
  Administered 2011-10-27: 4 mg via INTRAVENOUS
  Filled 2011-10-27: qty 2

## 2011-10-27 MED ORDER — SODIUM CHLORIDE 0.9 % IV SOLN
INTRAVENOUS | Status: DC
  Administered 2011-10-27: 20:00:00 via INTRAVENOUS

## 2011-10-27 NOTE — ED Notes (Addendum)
EMS, Sudden onset rt upper flank area, here earlier today, #20 SL left hand, pt was diaphoretic upon arrival, pain 10/10

## 2011-10-27 NOTE — ED Provider Notes (Signed)
History     CSN: [id number]  Arrival date & time 10/27/11  1831   First MD Initiated Contact with Patient 10/27/11 1922      Chief Complaint  Patient presents with  . Shortness of Breath    (Consider location/radiation/quality/duration/timing/severity/associated sxs/prior treatment) Patient is a 41 y.o. female presenting with shortness of breath. The history is provided by the patient, a relative and medical records. The history is limited by the condition of the patient.  Shortness of Breath  Associated symptoms include chest pain, cough and shortness of breath. Pertinent negatives include no fever.   the patient is a 41 year old, female, with a history of achalasia, and aspiration pneumonia, who presents to emergency department complaining of progressive right-sided chest pain, and shortness of breath.  She has not had a cough, fevers, or chills.  Denies nausea, or vomiting.  She was recently admitted to the hospital for these symptoms and had to have a pleurocentesis.  She denies immobility or recent trips.  Level V caveat applies for urgent need for intervention.  Because of severe pain and respiratory distress  Past Medical History  Diagnosis Date  . GERD (gastroesophageal reflux disease)   . Heart murmur   . Achalasia     S/p Heller myotomy 1991 and numerous dilations    Past Surgical History  Procedure Date  . Achalasia surgery 1991    transthoracic Heller myotomy in [place]  . Foreign body removal 10/08/2011    Procedure: FOREIGN BODY REMOVAL;  Surgeon: Petra Kuba, MD;  Location: [facility] ENDOSCOPY;  Service: Endoscopy;  Laterality: N/A;  botox  needed /ja/magod  . Esophagogastroduodenoscopy 10/08/2011    Procedure: ESOPHAGOGASTRODUODENOSCOPY (EGD);  Surgeon: Petra Kuba, MD;  Location: [facility] ENDOSCOPY;  Service: Endoscopy;  Laterality: N/A;  . Esophagogastroduodenoscopy 10/16/2011    Procedure: ESOPHAGOGASTRODUODENOSCOPY (EGD);  Surgeon: Florencia Reasons, MD;  Location: [facility] ENDOSCOPY;   Service: Endoscopy;  Laterality: N/A;  . Foreign body removal 10/16/2011    Procedure: FOREIGN BODY REMOVAL;  Surgeon: Florencia Reasons, MD;  Location: [facility] ENDOSCOPY;  Service: Endoscopy;  Laterality: N/A;  . Cesarean section     x2    Family History  Problem Relation Age of Onset  . Cancer Paternal Grandmother     breast  . Hypertension Mother     History  Substance Use Topics  . Smoking status: Former Smoker -- .5 years    Types: Cigarettes    Quit date: 10/07/1990  . Smokeless tobacco: Never Used   Comment: Smoked for 2 months, nothing heavier  . Alcohol Use: No     Doesn't drink but very rarely    OB History    Grav Para Term Preterm Abortions TAB SAB Ect Mult Living                  Review of Systems  Constitutional: Negative for fever and chills.  Respiratory: Positive for cough and shortness of breath. Negative for chest tightness.   Cardiovascular: Positive for chest pain. Negative for palpitations and leg swelling.  Gastrointestinal: Negative for nausea, vomiting and abdominal pain.  Neurological: Negative for headaches.  Psychiatric/Behavioral: Negative for confusion.  All other systems reviewed and are negative.    Allergies  Review of patient's allergies indicates no known allergies.  Home Medications   Current Outpatient Rx  Name Route Sig Dispense Refill  . AMOXICILLIN-POT CLAVULANATE 400-57 MG/5ML PO SUSR Oral Take 10 mLs by mouth every 12 (twelve) hours. Last dose February 16 p.m.  50 mL 0  . HYDROCODONE-HOMATROPINE 5-1.5 MG/5ML PO SYRP Oral Take 5 mLs by mouth every 6 (six) hours as needed. For pain management    . LORAZEPAM 2 MG/ML PO CONC Oral Take 0.3 mLs (0.6 mg total) by mouth every 6 (six) hours as needed. 30 mL 0    BP 108/63  Pulse 96  Temp(Src) 99.4 F (37.4 C) (Oral)  Resp 19  SpO2 100%  LMP 10/18/2011  Physical Exam  Vitals reviewed. Constitutional: She is oriented to person, place, and time. She appears well-developed and  well-nourished. She appears distressed.       In severe pain with respiratory distress, splinting  HENT:  Head: Normocephalic and atraumatic.  Eyes: Conjunctivae are normal. Pupils are equal, round, and reactive to light.  Neck: Normal range of motion. Neck supple.  Cardiovascular: Normal rate.   No murmur heard. Pulmonary/Chest: She is in respiratory distress. She has no wheezes.       Decreased breath sounds bilaterally in the bases  Abdominal: Soft. Bowel sounds are normal. She exhibits no distension. There is tenderness. There is no rebound and no guarding.       Mild right upper quadrant tenderness  Musculoskeletal: Normal range of motion. She exhibits no edema.  Neurological: She is alert and oriented to person, place, and time.  Skin: Skin is warm and dry.  Psychiatric: She has a normal mood and affect. Judgment and thought content normal.    ED Course  Procedures (including critical care time) 41 year old, female, with a history of aspiration pneumonia, and achalasia, presents to the emergency department with right-sided chest pain, and progressive shortness of breath, and nonproductive cough.  She is in respiratory distress.  We will perform a chest x-ray, and laboratory testing.  There is no wheezing, so, bronchodilators are not indicated at this time.  I will give her analgesics for her pain.  Labs Reviewed  CBC - Abnormal; Notable for the following:    WBC 16.8 (*)    Hemoglobin 8.5 (*)    HCT 26.3 (*)    MCV 62.8 (*)    MCH 20.3 (*)    Platelets 574 (*)    All other components within normal limits  DIFFERENTIAL - Abnormal; Notable for the following:    Neutrophils Relative 82 (*)    Neutro Abs 13.8 (*)    All other components within normal limits  COMPREHENSIVE METABOLIC PANEL - Abnormal; Notable for the following:    Albumin 3.2 (*)    AST 45 (*)    ALT 52 (*)    All other components within normal limits  D-DIMER, QUANTITATIVE - Abnormal; Notable for the  following:    D-Dimer, Quant 10.70 (*)    All other components within normal limits   Dg Chest [facility] 1 View  10/27/2011  *RADIOLOGY REPORT*  Clinical Data: Severe shortness of breath and history of pleural effusion and achalasia.  PORTABLE CHEST - 1 VIEW  Comparison: 10/22/2011 and CT from 10/18/2011 pain  Findings: Single view of the chest again demonstrates an enlarged esophagus along the right side of the chest.  There is high-density material within the esophagus and similar to the prior examination. There are increased densities at the left lung base suggestive for pleural fluid and possibly consolidation.  Slightly increased densities at the right lung base could represent atelectasis.  The upper lungs are clear.  The heart is obscured by the left basilar densities.  IMPRESSION: Increased densities at the left lung base  could represent increased pleural fluid and consolidation.  Slightly increased densities in the right lower lung may represent volume loss.  Enlarged esophagus containing high-density material.  Findings compatible with history of achalasia.  Original Report Authenticated By: Richarda Overlie, M.D.   Varney Biles Kayleen Memos W/kub  10/26/2011  *RADIOLOGY REPORT*  Clinical Data:  Achalasia.  Planning for reconstructive surgery.  UPPER GI SERIES WITH KUB  Technique:  Routine upper GI series was performed with thin and high density barium.  Fluoroscopy Time: 3 minutes 15 seconds  Comparison:  10/18/2011.  Findings: The thoracic esophagus is markedly dilated with diffuse mucosal irregularity that is probably secondary to stasis. Residual foodstuffs are present in the distal esophagus.  On administration of contrast, there is intercalation of contrast with foodstuffs.  In the distal esophagus, there is a shelf-like structure projecting into the esophageal lumen which may represent stricture or web however neoplasm is not excluded although considered unlikely.  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   There is tapering of the esophagus at the gastroesophageal junction which is slightly irregular.  This is compatible with history of achalasia. The gastroesophageal junction never observed to open and only a small amount of contrast passed into the stomach.  The gastric rugal folds grossly appear normal allowing for the tiny amount of contrast that passed into the stomach. Duodenal rotation was normal.  IMPRESSION: 1.  Thoracic esophageal dilation with distal narrowing at the gastroesophageal junction compatible with achalasia. 2.  Diffuse mucosal irregularity in the thoracic esophagus likely secondary to stasis.  Residual foodstuffs are present in the esophagus.  Bezoar is difficult to exclude although reportedly the patient had recent endoscopy with retrieval of esophageal contents. 3.  Suboptimal upper GI portion of the exam due to the only a tiny amount of contrast passing through the gastroesophageal junction into the stomach.  No anatomic gastric abnormalities are present. Normal mid gut rotation.  Original Report Authenticated By: Andreas Newport, M.D.     No diagnosis found.    MDM  Respiratory distress With bilateral lower lung densities, then, they will represent consolidation and effusion on the left side Although her ddimer is elevated, i do not think she had a pe.  This was entertained last time she was admitted but ct agnio did not show pe.  She has no risk factors for pe.   I believe the ddimer is elevated due to inflammation or pneumonia.        Nicholes Stairs, MD 10/27/11 [phone]

## 2011-10-28 ENCOUNTER — Encounter (HOSPITAL_COMMUNITY): Payer: Self-pay | Admitting: Family Medicine

## 2011-10-28 ENCOUNTER — Inpatient Hospital Stay (HOSPITAL_COMMUNITY): Payer: PRIVATE HEALTH INSURANCE

## 2011-10-28 DIAGNOSIS — J690 Pneumonitis due to inhalation of food and vomit: Secondary | ICD-10-CM | POA: Diagnosis present

## 2011-10-28 MED ORDER — VANCOMYCIN HCL IN DEXTROSE 1-5 GM/200ML-% IV SOLN
1000.0000 mg | Freq: Three times a day (TID) | INTRAVENOUS | Status: DC
  Administered 2011-10-28 – 2011-10-31 (×11): 1000 mg via INTRAVENOUS
  Filled 2011-10-28 (×13): qty 200

## 2011-10-28 MED ORDER — ENOXAPARIN SODIUM 40 MG/0.4ML ~~LOC~~ SOLN
40.0000 mg | SUBCUTANEOUS | Status: DC
  Administered 2011-10-28 – 2011-11-08 (×7): 40 mg via SUBCUTANEOUS
  Filled 2011-10-28 (×13): qty 0.4

## 2011-10-28 MED ORDER — ALBUTEROL SULFATE (5 MG/ML) 0.5% IN NEBU: 2.5000 mg | INHALATION_SOLUTION | RESPIRATORY_TRACT | Status: DC | PRN

## 2011-10-28 MED ORDER — MORPHINE SULFATE 2 MG/ML IJ SOLN
2.0000 mg | INTRAMUSCULAR | Status: DC | PRN
  Administered 2011-10-28 – 2011-11-07 (×18): 2 mg via INTRAVENOUS
  Filled 2011-10-28 (×18): qty 1

## 2011-10-28 MED ORDER — BENZONATATE 100 MG PO CAPS
100.0000 mg | ORAL_CAPSULE | Freq: Three times a day (TID) | ORAL | Status: DC | PRN
  Filled 2011-10-28: qty 1

## 2011-10-28 MED ORDER — HYDROCODONE-ACETAMINOPHEN 5-325 MG PO TABS
1.0000 | ORAL_TABLET | ORAL | Status: DC | PRN
  Administered 2011-10-28: 2 via ORAL
  Filled 2011-10-28 (×2): qty 2

## 2011-10-28 MED ORDER — HYDROCODONE-HOMATROPINE 5-1.5 MG/5ML PO SYRP: 5.0000 mL | ORAL_SOLUTION | Freq: Four times a day (QID) | ORAL | Status: DC | PRN

## 2011-10-28 MED ORDER — IPRATROPIUM BROMIDE 0.02 % IN SOLN: 0.5000 mg | RESPIRATORY_TRACT | Status: DC | PRN

## 2011-10-28 MED ORDER — POTASSIUM CHLORIDE IN NACL 20-0.9 MEQ/L-% IV SOLN
INTRAVENOUS | Status: DC
  Administered 2011-10-28 – 2011-11-03 (×8): via INTRAVENOUS
  Filled 2011-10-28 (×11): qty 1000

## 2011-10-28 MED ORDER — BOOST / RESOURCE BREEZE PO LIQD
1.0000 | Freq: Three times a day (TID) | ORAL | Status: DC
  Administered 2011-10-28 – 2011-10-30 (×3): 1 via ORAL

## 2011-10-28 MED ORDER — LORAZEPAM 2 MG/ML PO CONC
0.5000 mg | Freq: Three times a day (TID) | ORAL | Status: DC | PRN
  Administered 2011-10-30 – 2011-11-01 (×2): 0.5 mg via ORAL
  Filled 2011-10-28 (×2): qty 1

## 2011-10-28 MED ORDER — LEVOFLOXACIN IN D5W 750 MG/150ML IV SOLN
750.0000 mg | INTRAVENOUS | Status: DC
  Administered 2011-10-28 – 2011-11-04 (×8): 750 mg via INTRAVENOUS
  Filled 2011-10-28 (×9): qty 150

## 2011-10-28 MED ORDER — PIPERACILLIN-TAZOBACTAM 3.375 G IVPB
3.3750 g | Freq: Three times a day (TID) | INTRAVENOUS | Status: DC
  Administered 2011-10-28 – 2011-11-02 (×15): 3.375 g via INTRAVENOUS
  Filled 2011-10-28 (×19): qty 50

## 2011-10-28 NOTE — Progress Notes (Signed)
Patient ID: [name], female   DOB: 09-Dec-1970, 41 y.o.   MRN: [id number]  Assessment/Plan:  Principal Problem:   *Pneumonia - likely healthcare associated pneumonia - as per pulmonary continue Levaquin, vanco and zosyn - thoracentesis by IR US guided  Active Problems:   Achalasia - as per patient the surgery for Myotomy is scheduled for the end of February - called surgery but was told to call Monday to speak with Dr Daphine Deutscher who actually did the surgery in past for this patient and see if he would reschedule the surgery for sooner date   Leukocytosis - likely secondary to healthcare associated pneumonia - as per pulmonary the choice of antibiotics is adequate so continue Levaquin, vanco and zosyn - follow up on sputum and blood cultures   Thrombocytosis - unclear etiology - continue to monitor  Anemia, Microcytic - continue to monitor and if further drops below 8 consider transfusion   Pleural effusion, left - spoke with pulmonary on call and recommendation was to call IR to do US guided thoracentesis - as per IR the thoracentesis will be done tomorrow am   EDUCATION - test results and diagnostic studies were discussed with patient and pt's family who was present at the bedside - patient and family have verbalized the understanding - questions were answered at the bedside and contact information was provided for additional questions or concerns  Subjective: No events overnight. Patient still complains of right sided chest pain along the ribcage line although adequately controlled with analgesics.  Objective:  Vital signs in last 24 hours:  Filed Vitals:   10/28/11 0400 10/28/11 0415 10/28/11 0430 10/28/11 0553  BP:  106/63  105/70  Pulse: 88 87 89 96  Temp:    99.1 F (37.3 C)  TempSrc:    Oral  Resp:    15  Height:    6' 0.05" (1.83 m)  Weight:    91.7 kg (202 lb 2.6 oz)  SpO2: 94% 96% 95% 100%    Intake/Output from previous day:   Intake/Output Summary  (Last 24 hours) at 10/28/11 1430 Last data filed at 10/28/11 0700  Gross per 24 hour  Intake    420 ml  Output      0 ml  Net    420 ml    Physical Exam: General: Alert, awake, oriented x3, in no acute distress. HEENT: No bruits, no goiter. Moist mucous membranes, no scleral icterus, no conjunctival pallor. Heart: Regular rate and rhythm, S1/S2 +, no murmurs, rubs, gallops. Lungs: diminished breath sounds  Over left mid and lower lung lobe Abdomen: Soft, nontender, nondistended, positive bowel sounds. Extremities: No clubbing or cyanosis, no pitting edema,  positive pedal pulses. Neuro: Grossly nonfocal.  Lab Results:  Basic Metabolic Panel:    Component Value Date/Time   NA 138 10/27/2011 1953   K 3.6 10/27/2011 1953   CL 101 10/27/2011 1953   CO2 27 10/27/2011 1953   BUN 10 10/27/2011 1953   CREATININE 0.76 10/27/2011 1953   GLUCOSE 99 10/27/2011 1953   CALCIUM 9.5 10/27/2011 1953   CBC:    Component Value Date/Time   WBC 16.8* 10/27/2011 1953   HGB 8.5* 10/27/2011 1953   HCT 26.3* 10/27/2011 1953   PLT 574* 10/27/2011 1953   MCV 62.8* 10/27/2011 1953   NEUTROABS 13.8* 10/27/2011 1953   LYMPHSABS 2.0 10/27/2011 1953   MONOABS 1.0 10/27/2011 1953   EOSABS 0.0 10/27/2011 1953   BASOSABS 0.0 10/27/2011 1953  Lab 10/27/11 1953  WBC 16.8*  HGB 8.5*  HCT 26.3*  PLT 574*  MCV 62.8*  MCH 20.3*  MCHC 32.3  RDW 15.2  LYMPHSABS 2.0  MONOABS 1.0  EOSABS 0.0  BASOSABS 0.0  BANDABS --    Lab 10/27/11 1953  NA 138  K 3.6  CL 101  CO2 27  GLUCOSE 99  BUN 10  CREATININE 0.76  CALCIUM 9.5  MG --    Recent Results (from the past 240 hour(s))  URINE CULTURE     Status: Normal   Collection Time   10/18/11 10:46 PM      Component Value Range Status Comment   Specimen Description URINE, CLEAN CATCH   Final    Special Requests Normal   Final    Culture  Setup Time [id number]   Final    Colony Count 20,OOO COLONIES/ML   Final    Culture     Final    Value: Multiple  bacterial morphotypes present, none predominant. Suggest appropriate recollection if clinically indicated.   Report Status 10/20/2011 FINAL   Final   CULTURE, BLOOD (ROUTINE X 2)     Status: Normal   Collection Time   10/18/11 11:20 PM      Component Value Range Status Comment   Specimen Description BLOOD LEFT HAND   Final    Special Requests BOTTLES DRAWN AEROBIC AND ANAEROBIC [facility]   Final    Culture  Setup Time [id number]   Final    Culture NO GROWTH 5 DAYS   Final    Report Status 10/25/2011 FINAL   Final   CULTURE, BLOOD (ROUTINE X 2)     Status: Normal   Collection Time   10/18/11 11:25 PM      Component Value Range Status Comment   Specimen Description BLOOD RIGHT HAND   Final    Special Requests BOTTLES DRAWN AEROBIC AND ANAEROBIC 2.5CC   Final    Culture  Setup Time [id number]   Final    Culture NO GROWTH 5 DAYS   Final    Report Status 10/25/2011 FINAL   Final   BODY FLUID CULTURE     Status: Normal   Collection Time   10/20/11  9:42 AM      Component Value Range Status Comment   Specimen Description PLEURAL   Final    Special Requests NONE   Final    Gram Stain     Final    Value: WBC PRESENT,BOTH PMN AND MONONUCLEAR     NO ORGANISMS SEEN   Culture NO GROWTH 3 DAYS   Final    Report Status 10/23/2011 FINAL   Final   CULTURE, SPUTUM-ASSESSMENT     Status: Normal   Collection Time   10/21/11  7:18 PM      Component Value Range Status Comment   Specimen Description SPUTUM   Final    Special Requests Normal   Final    Sputum evaluation     Final    Value: THIS SPECIMEN IS ACCEPTABLE. RESPIRATORY CULTURE REPORT TO FOLLOW.   Report Status 10/21/2011 FINAL   Final   CULTURE, RESPIRATORY     Status: Normal   Collection Time   10/21/11  7:18 PM      Component Value Range Status Comment   Specimen Description SPUTUM   Final    Special Requests NONE   Final    Gram Stain     Final    Value: RARE WBC  PRESENT, PREDOMINANTLY PMN     FEW SQUAMOUS EPITHELIAL CELLS PRESENT      RARE GRAM POSITIVE COCCI IN PAIRS AND CHAINS     IN CLUSTERS   Culture NORMAL OROPHARYNGEAL FLORA   Final    Report Status 10/24/2011 FINAL   Final     Studies/Results: [place] Chest 10/28/2011   IMPRESSION:  1.  Moderate residual or recurrent left pleural effusion.    Dg Chest Port 1 View 10/27/2011   IMPRESSION: Increased densities at the left lung base could represent increased pleural fluid and consolidation.  Slightly increased densities in the right lower lung may represent volume loss.  Enlarged esophagus containing high-density material.  Findings compatible with history of achalasia.   Medications: Scheduled Meds:   . enoxaparin  40 mg Subcutaneous Q24H  . feeding supplement  1 Container Oral TID WC  .  HYDROmorphone (DILAUDID) injection  1 mg Intravenous Once  .  HYDROmorphone (DILAUDID) injection  1 mg Intravenous Once  . levofloxacin (LEVAQUIN) IV  750 mg Intravenous Q24H  . ondansetron  4 mg Intravenous Once  . piperacillin-tazobactam (ZOSYN)  IV  3.375 g Intravenous Q8H  . vancomycin  1,000 mg Intravenous Q8H   Continuous Infusions:   . 0.9 % NaCl with KCl 20 mEq / L 75 mL/hr at 10/28/11 0700  . DISCONTD: sodium chloride 125 mL/hr at 10/27/11 1934   PRN Meds:.albuterol, benzonatate, HYDROcodone-acetaminophen, HYDROcodone-homatropine, ipratropium, LORazepam, morphine    LOS: 1 day   Meredith Kilbride 10/28/2011, 2:30 PM  [facility] Pager: [phone]

## 2011-10-28 NOTE — ED Notes (Signed)
Pt's husband, Susy Frizzle, request to be called when pt gets moved. Cell # [phone].

## 2011-10-28 NOTE — Progress Notes (Signed)
ANTIBIOTIC CONSULT NOTE - INITIAL  Pharmacy Consult for Vancomycin, Zosyn & Levaquin  Indication: Suspected Hospital Acquired Pneumonia  No Known Allergies  Patient Measurements: Height: 6' 0.05" (183 cm) Weight: 202 lb 2.6 oz (91.7 kg) IBW/kg (Calculated) : 73.21    Vital Signs: Temp: 99.1 F (37.3 C) (02/17 0553) Temp src: Oral (02/17 0553) BP: 105/70 mmHg (02/17 0553) Pulse Rate: 96  (02/17 0553) Intake/Output from previous day:   Intake/Output from this shift:    Labs:  [facility] 10/27/11 1953  WBC 16.8*  HGB 8.5*  PLT 574*  LABCREA --  CREATININE 0.76   Estimated Creatinine Clearance: 117.8 ml/min (by C-G formula based on Cr of 0.76). No results found for this basename: VANCOTROUGH:2,VANCOPEAK:2,VANCORANDOM:2,GENTTROUGH:2,GENTPEAK:2,GENTRANDOM:2,TOBRATROUGH:2,TOBRAPEAK:2,TOBRARND:2,AMIKACINPEAK:2,AMIKACINTROU:2,AMIKACIN:2, in the last 72 hours   Microbiology: Recent Results (from the past 720 hour(s))  URINE CULTURE     Status: Normal   Collection Time   10/18/11 10:46 PM      Component Value Range Status Comment   Specimen Description URINE, CLEAN CATCH   Final    Special Requests Normal   Final    Culture  Setup Time [id number]   Final    Colony Count 20,OOO COLONIES/ML   Final    Culture     Final    Value: Multiple bacterial morphotypes present, none predominant. Suggest appropriate recollection if clinically indicated.   Report Status 10/20/2011 FINAL   Final   CULTURE, BLOOD (ROUTINE X 2)     Status: Normal   Collection Time   10/18/11 11:20 PM      Component Value Range Status Comment   Specimen Description BLOOD LEFT HAND   Final    Special Requests BOTTLES DRAWN AEROBIC AND ANAEROBIC [facility]   Final    Culture  Setup Time [id number]   Final    Culture NO GROWTH 5 DAYS   Final    Report Status 10/25/2011 FINAL   Final   CULTURE, BLOOD (ROUTINE X 2)     Status: Normal   Collection Time   10/18/11 11:25 PM      Component Value Range Status Comment   Specimen Description BLOOD RIGHT HAND   Final    Special Requests BOTTLES DRAWN AEROBIC AND ANAEROBIC 2.5CC   Final    Culture  Setup Time [id number]   Final    Culture NO GROWTH 5 DAYS   Final    Report Status 10/25/2011 FINAL   Final   BODY FLUID CULTURE     Status: Normal   Collection Time   10/20/11  9:42 AM      Component Value Range Status Comment   Specimen Description PLEURAL   Final    Special Requests NONE   Final    Gram Stain     Final    Value: WBC PRESENT,BOTH PMN AND MONONUCLEAR     NO ORGANISMS SEEN   Culture NO GROWTH 3 DAYS   Final    Report Status 10/23/2011 FINAL   Final   CULTURE, SPUTUM-ASSESSMENT     Status: Normal   Collection Time   10/21/11  7:18 PM      Component Value Range Status Comment   Specimen Description SPUTUM   Final    Special Requests Normal   Final    Sputum evaluation     Final    Value: THIS SPECIMEN IS ACCEPTABLE. RESPIRATORY CULTURE REPORT TO FOLLOW.   Report Status 10/21/2011 FINAL   Final   CULTURE, RESPIRATORY     Status:  Normal   Collection Time   10/21/11  7:18 PM      Component Value Range Status Comment   Specimen Description SPUTUM   Final    Special Requests NONE   Final    Gram Stain     Final    Value: RARE WBC PRESENT, PREDOMINANTLY PMN     FEW SQUAMOUS EPITHELIAL CELLS PRESENT     RARE GRAM POSITIVE COCCI IN PAIRS AND CHAINS     IN CLUSTERS   Culture NORMAL OROPHARYNGEAL FLORA   Final    Report Status 10/24/2011 FINAL   Final     Medical History: Past Medical History  Diagnosis Date  . GERD (gastroesophageal reflux disease)   . Heart murmur   . Achalasia     S/p Heller myotomy 1991 and numerous dilations    Medications:  Scheduled:    . enoxaparin  40 mg Subcutaneous Q24H  .  HYDROmorphone (DILAUDID) injection  1 mg Intravenous Once  .  HYDROmorphone (DILAUDID) injection  1 mg Intravenous Once  . ondansetron  4 mg Intravenous Once   Infusions:    . 0.9 % NaCl with KCl 20 mEq / L    . DISCONTD: sodium  chloride 125 mL/hr at 10/27/11 1934   Assessment: 41 year old female with recent hospitalization for aspiration pneumonia  Goal of Therapy:  Vancomycin trough level 15-20 mcg/ml  Plan:  1.  Levaquin 750mg  IV q24h 2.  Zosyn 3.375gm IV q8h (extended infusion) 3.  Vancomycin 1000mg  IV q8h 4.  Check vancomycin trough level as needed.  [name] 10/28/2011,5:56 AM

## 2011-10-28 NOTE — Progress Notes (Signed)
INITIAL ADULT NUTRITION ASSESSMENT Date: 10/28/2011   Time: 10:48 AM Reason for Assessment: Consult for unintentional weight loss of > 10 pounds over the past month  ASSESSMENT: Female 41 y.o.   Dx: Pneumonia  Hx:  Past Medical History  Diagnosis Date  . GERD (gastroesophageal reflux disease)   . Heart murmur   . Achalasia     S/p Heller myotomy 1991 and numerous dilations   Past Surgical History  Procedure Date  . Achalasia surgery 1991    transthoracic Heller myotomy in [place]  . Foreign body removal 10/08/2011    Procedure: FOREIGN BODY REMOVAL;  Surgeon: Petra Kuba, MD;  Location: [facility] ENDOSCOPY;  Service: Endoscopy;  Laterality: N/A;  botox  needed /ja/magod  . Esophagogastroduodenoscopy 10/08/2011    Procedure: ESOPHAGOGASTRODUODENOSCOPY (EGD);  Surgeon: Petra Kuba, MD;  Location: [facility] ENDOSCOPY;  Service: Endoscopy;  Laterality: N/A;  . Esophagogastroduodenoscopy 10/16/2011    Procedure: ESOPHAGOGASTRODUODENOSCOPY (EGD);  Surgeon: Florencia Reasons, MD;  Location: [facility] ENDOSCOPY;  Service: Endoscopy;  Laterality: N/A;  . Foreign body removal 10/16/2011    Procedure: FOREIGN BODY REMOVAL;  Surgeon: Florencia Reasons, MD;  Location: [facility] ENDOSCOPY;  Service: Endoscopy;  Laterality: N/A;  . Cesarean section     x2   Related Meds:  Scheduled Meds:   . enoxaparin  40 mg Subcutaneous Q24H  .  HYDROmorphone (DILAUDID) injection  1 mg Intravenous Once  .  HYDROmorphone (DILAUDID) injection  1 mg Intravenous Once  . levofloxacin (LEVAQUIN) IV  750 mg Intravenous Q24H  . ondansetron  4 mg Intravenous Once  . piperacillin-tazobactam (ZOSYN)  IV  3.375 g Intravenous Q8H  . vancomycin  1,000 mg Intravenous Q8H   Continuous Infusions:   . 0.9 % NaCl with KCl 20 mEq / L 75 mL/hr at 10/28/11 0700  . DISCONTD: sodium chloride 125 mL/hr at 10/27/11 1934   PRN Meds:.albuterol, benzonatate, HYDROcodone-acetaminophen, HYDROcodone-homatropine, ipratropium, LORazepam, morphine  Ht: 6' 0.05" (183  cm)  Wt: 202 lb 2.6 oz (91.7 kg)  Ideal Wt: 72.7 kg % Ideal Wt: 126.2%  Usual Wt: 190 lb.  % Usual Wt: 106%  Body mass index is 27.38 kg/(m^2).  Food/Nutrition Related Hx: Patient drinks atkins and Ensure nutrition supplements at home. Patient has been drinking liquids since surgical procedure on 10/08/11.   Labs:  CMP     Component Value Date/Time   NA 138 10/27/2011 1953   K 3.6 10/27/2011 1953   CL 101 10/27/2011 1953   CO2 27 10/27/2011 1953   GLUCOSE 99 10/27/2011 1953   BUN 10 10/27/2011 1953   CREATININE 0.76 10/27/2011 1953   CALCIUM 9.5 10/27/2011 1953   PROT 8.3 10/27/2011 1953   ALBUMIN 3.2* 10/27/2011 1953   AST 45* 10/27/2011 1953   ALT 52* 10/27/2011 1953   ALKPHOS 80 10/27/2011 1953   BILITOT 0.3 10/27/2011 1953   GFRNONAA >90 10/27/2011 1953   GFRAA >90 10/27/2011 1953    Intake/Output Summary (Last 24 hours) at 10/28/11 1050 Last data filed at 10/28/11 0700  Gross per 24 hour  Intake    420 ml  Output      0 ml  Net    420 ml    Diet Order: Clear Liquid  Supplements/Tube Feeding: none at this time  IVF:    0.9 % NaCl with KCl 20 mEq / L Last Rate: 75 mL/hr at 10/28/11 0700  DISCONTD: sodium chloride Last Rate: 125 mL/hr at 10/27/11 1934    Estimated Nutritional  Needs:   Kcal: 1700-2000 Protein: 101-120 grams Fluid: 1 ml per kcal  NUTRITION DIAGNOSIS: -Inadequate oral intake (NI-2.1).  Status: Ongoing  RELATED TO: inability to swallow  AS EVIDENCE BY: clear liquid diet restriction  MONITORING/EVALUATION(Goals): Diet advancement, weight trends, labs, I/O's, PO intake 1. Promote weight maintenance 2. PO intake greater than 75% at meals and supplements  EDUCATION NEEDS: -No education needs identified at this time  INTERVENTION: 1. Will order Resource Breeze TID to provide and additional 750 kcal and 27 g protein daily.   Dietitian [phone]  DOCUMENTATION CODES Per approved criteria  -Not Applicable    Iven Finn [facility] 10/28/2011,  10:48 AM

## 2011-10-28 NOTE — H&P (Addendum)
PCP:   Surgeon Dr. Nickie Retort GI Dr. Charlott Rakes  Chief Complaint:  Right-sided chest pains   HPI: This is a 41 year old female who was recently admitted and treated for aspiration pneumonia. During that time she had a diagnostic and therapeutic thoracentesis and would unrevealing cultures. She was treated with antibiotics and discharged home to 08/29/2012. Patient has been taking the antibiotic. Since then she states she's been doing well however, today at approximately 5 PM she developed sudden onset of right sided posterior chest pains. She reports that she had pain with deep breathing however she was not short of breath. She reports no wheezing. She states she had fevers yesterday but none today. She reports no nausea,, vomiting, or chills. She came to the ER. She does have a surgery scheduled with Dr. Wenda Low for a redo Myotomy. patient wonders if the surgery can be moved up.   (This portion is copied and pasted verbatim from Dr Lawson Radar notes 10/18/11) This patient first presented to [facility] in 05/2011 when she was admitted for upper GI obstruction  for achalasia, which had been diagnosed in [place] in 1988 and for which she had had multiple dilations. She had  EGD with botox injxns done by GI. Noted to be anemic, thought likely IDA. She followed up with GI as outpt in  06/2011. She was then seen again in ED 10/08/2011 for similar presentation after having seen a surgeon re: redo  achalasia surgery, and they apparently did redo EGD/Botox injection that day. Then, on 10/12/2011, Dr. Ermalene Searing  note indicates pt started developing chest pain, pleurisy, and fevers, was tachy to 100. They sent her for  chest CT which showed distended fluid filled esophagus, however it also noted small pericardial and tiny left  pleural effusion, for which she was started on Augmentin. She had another EGD done on 2/5 with food removal.    Review of Systems:Positives bolded   anorexia, fever, weight  loss,, vision loss, decreased hearing, hoarseness, chest pain, syncope, dyspnea on exertion, peripheral edema, balance deficits, hemoptysis, abdominal pain, melena, hematochezia, severe indigestion/heartburn, hematuria, incontinence, genital sores, muscle weakness, suspicious skin lesions, transient blindness, difficulty walking, depression, unusual weight change, abnormal bleeding, enlarged lymph nodes, angioedema, and breast masses.  Past Medical History: Past Medical History  Diagnosis Date  . GERD (gastroesophageal reflux disease)   . Heart murmur   . Achalasia     S/p Heller myotomy 1991 and numerous dilations   Past Surgical History  Procedure Date  . Achalasia surgery 1991    transthoracic Heller myotomy in [place]  . Foreign body removal 10/08/2011    Procedure: FOREIGN BODY REMOVAL;  Surgeon: Petra Kuba, MD;  Location: [facility] ENDOSCOPY;  Service: Endoscopy;  Laterality: N/A;  botox  needed /ja/magod  . Esophagogastroduodenoscopy 10/08/2011    Procedure: ESOPHAGOGASTRODUODENOSCOPY (EGD);  Surgeon: Petra Kuba, MD;  Location: [facility] ENDOSCOPY;  Service: Endoscopy;  Laterality: N/A;  . Esophagogastroduodenoscopy 10/16/2011    Procedure: ESOPHAGOGASTRODUODENOSCOPY (EGD);  Surgeon: Florencia Reasons, MD;  Location: [facility] ENDOSCOPY;  Service: Endoscopy;  Laterality: N/A;  . Foreign body removal 10/16/2011    Procedure: FOREIGN BODY REMOVAL;  Surgeon: Florencia Reasons, MD;  Location: [facility] ENDOSCOPY;  Service: Endoscopy;  Laterality: N/A;  . Cesarean section     x2    Medications: Prior to Admission medications   Medication Sig Start Date End Date Taking? Authorizing Provider  amoxicillin-clavulanate (AUGMENTIN) 400-57 MG/5ML suspension Take 10 mLs by mouth every 12 (twelve) hours. Last  dose February 16 p.m. 10/23/11 11/02/11 Yes Brendia Sacks, MD  HYDROcodone-homatropine [facility]) 5-1.5 MG/5ML syrup Take 5 mLs by mouth every 6 (six) hours as needed. For pain management   Yes Historical Provider, MD    LORazepam (ATIVAN) 2 MG/ML concentrated solution Take 0.3 mLs (0.6 mg total) by mouth every 6 (six) hours as needed. 10/23/11 11/02/11 Yes Brendia Sacks, MD    Allergies:  No Known Allergies  Social History:  reports that she quit smoking about 21 years ago. Her smoking use included Cigarettes. She quit after .5 years of use. She has never used smokeless tobacco. She reports that she does not drink alcohol or use illicit drugs.  Family History: Family History  Problem Relation Age of Onset  . Cancer Paternal Grandmother     breast  . Hypertension Mother     Physical Exam: Filed Vitals:   10/27/11 1843 10/27/11 1947  BP: 113/68 108/63  Pulse: 107 96  Temp: 99.4 F (37.4 C)   TempSrc: Oral   Resp: 26 19  SpO2: 100% 100%    General:  Alert and oriented times three, well developed and nourished, no acute distress Eyes: PERRLA, pink conjunctiva, no scleral icterus ENT: Moist oral mucosa, neck supple, no thyromegaly Lungs: clear to ascultation, no wheeze, no crackles, no use of accessory muscles Cardiovascular: regular rate and rhythm, no regurgitation, no gallops, no murmurs. No carotid bruits, no JVD Abdomen: soft, positive BS, non-tender, non-distended, no organomegaly, not an acute abdomen GU: not examined Neuro: CN II - XII grossly intact, sensation intact Musculoskeletal: strength 5/5 all extremities, no clubbing, cyanosis or edema Skin: no rash, no subcutaneous crepitation, no decubitus Psych: appropriate patient   Labs on Admission:   [facility] 10/27/11 1953  NA 138  K 3.6  CL 101  CO2 27  GLUCOSE 99  BUN 10  CREATININE 0.76  CALCIUM 9.5  MG --  PHOS --    Basename 10/27/11 1953  AST 45*  ALT 52*  ALKPHOS 80  BILITOT 0.3  PROT 8.3  ALBUMIN 3.2*  Results for GERTRUE, WILLETTE (MRN [id number]) as of 10/28/2011 19:26  Ref. Range 10/27/2011 22:54  Troponin i, poc Latest Range: 0.00-0.08 ng/mL 0.01   No results found for this basename: LIPASE:2,AMYLASE:2 in  the last 72 hours  Basename 10/27/11 1953  WBC 16.8*  NEUTROABS 13.8*  HGB 8.5*  HCT 26.3*  MCV 62.8*  PLT 574*   No results found for this basename: CKTOTAL:3,CKMB:3,CKMBINDEX:3,TROPONINI:3 in the last 72 hours No components found with this basename: POCBNP:3  Basename 10/27/11 1953  DDIMER 10.70*   No results found for this basename: HGBA1C:2 in the last 72 hours No results found for this basename: CHOL:2,HDL:2,LDLCALC:2,TRIG:2,CHOLHDL:2,LDLDIRECT:2 in the last 72 hours No results found for this basename: TSH,T4TOTAL,FREET3,T3FREE,THYROIDAB in the last 72 hours No results found for this basename: VITAMINB12:2,FOLATE:2,FERRITIN:2,TIBC:2,IRON:2,RETICCTPCT:2 in the last 72 hours  Micro Results: Recent Results (from the past 240 hour(s))  URINE CULTURE     Status: Normal   Collection Time   10/18/11 10:46 PM      Component Value Range Status Comment   Specimen Description URINE, CLEAN CATCH   Final    Special Requests Normal   Final    Culture  Setup Time [id number]   Final    Colony Count 20,OOO COLONIES/ML   Final    Culture     Final    Value: Multiple bacterial morphotypes present, none predominant. Suggest appropriate recollection if clinically indicated.   Report Status  10/20/2011 FINAL   Final   CULTURE, BLOOD (ROUTINE X 2)     Status: Normal   Collection Time   10/18/11 11:20 PM      Component Value Range Status Comment   Specimen Description BLOOD LEFT HAND   Final    Special Requests BOTTLES DRAWN AEROBIC AND ANAEROBIC [facility]   Final    Culture  Setup Time [id number]   Final    Culture NO GROWTH 5 DAYS   Final    Report Status 10/25/2011 FINAL   Final   CULTURE, BLOOD (ROUTINE X 2)     Status: Normal   Collection Time   10/18/11 11:25 PM      Component Value Range Status Comment   Specimen Description BLOOD RIGHT HAND   Final    Special Requests BOTTLES DRAWN AEROBIC AND ANAEROBIC 2.5CC   Final    Culture  Setup Time [id number]   Final    Culture NO GROWTH 5  DAYS   Final    Report Status 10/25/2011 FINAL   Final   BODY FLUID CULTURE     Status: Normal   Collection Time   10/20/11  9:42 AM      Component Value Range Status Comment   Specimen Description PLEURAL   Final    Special Requests NONE   Final    Gram Stain     Final    Value: WBC PRESENT,BOTH PMN AND MONONUCLEAR     NO ORGANISMS SEEN   Culture NO GROWTH 3 DAYS   Final    Report Status 10/23/2011 FINAL   Final   CULTURE, SPUTUM-ASSESSMENT     Status: Normal   Collection Time   10/21/11  7:18 PM      Component Value Range Status Comment   Specimen Description SPUTUM   Final    Special Requests Normal   Final    Sputum evaluation     Final    Value: THIS SPECIMEN IS ACCEPTABLE. RESPIRATORY CULTURE REPORT TO FOLLOW.   Report Status 10/21/2011 FINAL   Final   CULTURE, RESPIRATORY     Status: Normal   Collection Time   10/21/11  7:18 PM      Component Value Range Status Comment   Specimen Description SPUTUM   Final    Special Requests NONE   Final    Gram Stain     Final    Value: RARE WBC PRESENT, PREDOMINANTLY PMN     FEW SQUAMOUS EPITHELIAL CELLS PRESENT     RARE GRAM POSITIVE COCCI IN PAIRS AND CHAINS     IN CLUSTERS   Culture NORMAL OROPHARYNGEAL FLORA   Final    Report Status 10/24/2011 FINAL   Final      Radiological Exams on Admission: Dg Chest Port 1 View  10/27/2011  *RADIOLOGY REPORT*  Clinical Data: Severe shortness of breath and history of pleural effusion and achalasia.  PORTABLE CHEST - 1 VIEW  Comparison: 10/22/2011 and CT from 10/18/2011 pain  Findings: Single view of the chest again demonstrates an enlarged esophagus along the right side of the chest.  There is high-density material within the esophagus and similar to the prior examination. There are increased densities at the left lung base suggestive for pleural fluid and possibly consolidation.  Slightly increased densities at the right lung base could represent atelectasis.  The upper lungs are clear.  The  heart is obscured by the left basilar densities.  IMPRESSION: Increased densities at the left lung  base could represent increased pleural fluid and consolidation.  Slightly increased densities in the right lower lung may represent volume loss.  Enlarged esophagus containing high-density material.  Findings compatible with history of achalasia.  Original Report Authenticated By: Richarda Overlie, M.D.   Varney Biles Kayleen Memos W/kub  10/26/2011  *RADIOLOGY REPORT*  Clinical Data:  Achalasia.  Planning for reconstructive surgery.  UPPER GI SERIES WITH KUB  Technique:  Routine upper GI series was performed with thin and high density barium.  Fluoroscopy Time: 3 minutes 15 seconds  Comparison:  10/18/2011.  Findings: The thoracic esophagus is markedly dilated with diffuse mucosal irregularity that is probably secondary to stasis. Residual foodstuffs are present in the distal esophagus.  On administration of contrast, there is intercalation of contrast with foodstuffs.  In the distal esophagus, there is a shelf-like structure projecting into the esophageal lumen which may represent stricture or web however neoplasm is not excluded although considered unlikely.  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  There is tapering of the esophagus at the gastroesophageal junction which is slightly irregular.  This is compatible with history of achalasia. The gastroesophageal junction never observed to open and only a small amount of contrast passed into the stomach.  The gastric rugal folds grossly appear normal allowing for the tiny amount of contrast that passed into the stomach. Duodenal rotation was normal.  IMPRESSION: 1.  Thoracic esophageal dilation with distal narrowing at the gastroesophageal junction compatible with achalasia. 2.  Diffuse mucosal irregularity in the thoracic esophagus likely secondary to stasis.  Residual foodstuffs are present in the esophagus.  Bezoar is difficult to exclude although reportedly the patient  had recent endoscopy with retrieval of esophageal contents. 3.  Suboptimal upper GI portion of the exam due to the only a tiny amount of contrast passing through the gastroesophageal junction into the stomach.  No anatomic gastric abnormalities are present. Normal mid gut rotation.  Original Report Authenticated By: Andreas Newport, M.D.   EKG: normal sinus rhythm  Assessment/Plan Present on Admission:  . hospital-acquired Pneumonia Admit to MedSurg Sputum cultures and blood cultures collected Patient will be treated with broad-spectrum antibiotics Oxygen and nebulizers ordered As well as pain medication  .Achalasia Question bezoar  Patient will be maintained a clear liquid diet. Reconsult GI in a.m.  Marland KitchenPleural effusion, left Will order ultrasound to to reevaluate for reaccumulation of fluids   Full code DVT prophylaxis Team 3/Dr. Lorel Monaco, [place] 10/28/2011, 12:15 AM

## 2011-10-29 ENCOUNTER — Inpatient Hospital Stay (HOSPITAL_COMMUNITY): Payer: PRIVATE HEALTH INSURANCE

## 2011-10-29 LAB — BASIC METABOLIC PANEL
BUN: 5 mg/dL — ABNORMAL LOW (ref 6–23)
CO2: 26 mEq/L (ref 19–32)
Calcium: 8.9 mg/dL (ref 8.4–10.5)
Chloride: 102 mEq/L (ref 96–112)
Creatinine, Ser: 0.84 mg/dL (ref 0.50–1.10)
GFR calc Af Amer: >90 mL/min (ref >90)
GFR calc non Af Amer: 85 mL/min — ABNORMAL LOW (ref >90)
Glucose, Bld: 98 mg/dL (ref 70–99)
Potassium: 3.5 mEq/L (ref 3.5–5.1)
Sodium: 138 mEq/L (ref 135–145)

## 2011-10-29 LAB — CBC
HCT: 26.3 % — ABNORMAL LOW (ref 36.0–46.0)
Hemoglobin: 8.4 g/dL — ABNORMAL LOW (ref 12.0–15.0)
MCH: 20.2 pg — ABNORMAL LOW (ref 26.0–34.0)
MCHC: 31.9 g/dL (ref 30.0–36.0)
MCV: 63.2 fL — ABNORMAL LOW (ref 78.0–100.0)
Platelets: 519 10*3/uL — ABNORMAL HIGH (ref 150–400)
RBC: 4.16 MIL/uL (ref 3.87–5.11)
RDW: 15 % (ref 11.5–15.5)
WBC: 13.7 10*3/uL — ABNORMAL HIGH (ref 4.0–10.5)

## 2011-10-29 LAB — VANCOMYCIN, TROUGH: Vancomycin Tr: 18.9 ug/mL (ref 10.0–20.0)

## 2011-10-29 NOTE — Progress Notes (Signed)
ANTIBIOTIC CONSULT NOTE - F/U  Pharmacy Consult for Vancomycin, Zosyn & Levaquin  Indication: Suspected Hospital Acquired Pneumonia  No Known Allergies  Patient Measurements: Height: 6' 0.05" (183 cm) Weight: 202 lb 2.6 oz (91.7 kg) IBW/kg (Calculated) : 73.21    Vital Signs: Temp: 99 F (37.2 C) (02/18 2200) Temp src: Oral (02/18 2200) BP: 107/68 mmHg (02/18 2200) Pulse Rate: 92  (02/18 2200) Intake/Output from previous day: 02/17 0701 - 02/18 0700 In: 2725 [P.O.:600; I.V.:1575; IV Piggyback:550] Out: 1700 [Urine:1700] Intake/Output from this shift: Total I/O In: -  Out: 1100 [Urine:1100]  Labs:  [facility] 10/29/11 0515 10/27/11 1953  WBC 13.7* 16.8*  HGB 8.4* 8.5*  PLT 519* 574*  LABCREA -- --  CREATININE 0.84 0.76   Estimated Creatinine Clearance: 112.1 ml/min (by C-G formula based on Cr of 0.84).  Basename 10/29/11 2246  VANCOTROUGH 18.9  VANCOPEAK --  VANCORANDOM --  GENTTROUGH --  GENTPEAK --  GENTRANDOM --  TOBRATROUGH --  TOBRAPEAK --  TOBRARND --  AMIKACINPEAK --  AMIKACINTROU --  AMIKACIN --     Microbiology: Recent Results (from the past 720 hour(s))  URINE CULTURE     Status: Normal   Collection Time   10/18/11 10:46 PM      Component Value Range Status Comment   Specimen Description URINE, CLEAN CATCH   Final    Special Requests Normal   Final    Culture  Setup Time [id number]   Final    Colony Count 20,OOO COLONIES/ML   Final    Culture     Final    Value: Multiple bacterial morphotypes present, none predominant. Suggest appropriate recollection if clinically indicated.   Report Status 10/20/2011 FINAL   Final   CULTURE, BLOOD (ROUTINE X 2)     Status: Normal   Collection Time   10/18/11 11:20 PM      Component Value Range Status Comment   Specimen Description BLOOD LEFT HAND   Final    Special Requests BOTTLES DRAWN AEROBIC AND ANAEROBIC [facility]   Final    Culture  Setup Time [id number]   Final    Culture NO GROWTH 5 DAYS   Final    Report Status 10/25/2011 FINAL   Final   CULTURE, BLOOD (ROUTINE X 2)     Status: Normal   Collection Time   10/18/11 11:25 PM      Component Value Range Status Comment   Specimen Description BLOOD RIGHT HAND   Final    Special Requests BOTTLES DRAWN AEROBIC AND ANAEROBIC 2.5CC   Final    Culture  Setup Time [id number]   Final    Culture NO GROWTH 5 DAYS   Final    Report Status 10/25/2011 FINAL   Final   BODY FLUID CULTURE     Status: Normal   Collection Time   10/20/11  9:42 AM      Component Value Range Status Comment   Specimen Description PLEURAL   Final    Special Requests NONE   Final    Gram Stain     Final    Value: WBC PRESENT,BOTH PMN AND MONONUCLEAR     NO ORGANISMS SEEN   Culture NO GROWTH 3 DAYS   Final    Report Status 10/23/2011 FINAL   Final   CULTURE, SPUTUM-ASSESSMENT     Status: Normal   Collection Time   10/21/11  7:18 PM      Component Value Range Status Comment   Specimen  Description SPUTUM   Final    Special Requests Normal   Final    Sputum evaluation     Final    Value: THIS SPECIMEN IS ACCEPTABLE. RESPIRATORY CULTURE REPORT TO FOLLOW.   Report Status 10/21/2011 FINAL   Final   CULTURE, RESPIRATORY     Status: Normal   Collection Time   10/21/11  7:18 PM      Component Value Range Status Comment   Specimen Description SPUTUM   Final    Special Requests NONE   Final    Gram Stain     Final    Value: RARE WBC PRESENT, PREDOMINANTLY PMN     FEW SQUAMOUS EPITHELIAL CELLS PRESENT     RARE GRAM POSITIVE COCCI IN PAIRS AND CHAINS     IN CLUSTERS   Culture NORMAL OROPHARYNGEAL FLORA   Final    Report Status 10/24/2011 FINAL   Final   CULTURE, BLOOD (ROUTINE X 2)     Status: Normal (Preliminary result)   Collection Time   10/28/11  1:15 AM      Component Value Range Status Comment   Specimen Description BLOOD RIGHT ANTECUBITAL   Final    Special Requests BOTTLES DRAWN AEROBIC AND ANAEROBIC 5CC   Final    Culture  Setup Time [id number]   Final    Culture      Final    Value:        BLOOD CULTURE RECEIVED NO GROWTH TO DATE CULTURE WILL BE HELD FOR 5 DAYS BEFORE ISSUING A FINAL NEGATIVE REPORT   Report Status PENDING   Incomplete   CULTURE, BLOOD (ROUTINE X 2)     Status: Normal (Preliminary result)   Collection Time   10/28/11  1:25 AM      Component Value Range Status Comment   Specimen Description BLOOD RIGHT HAND   Final    Special Requests     Final    Value: BOTTLES DRAWN AEROBIC AND ANAEROBIC 5CC AEROBIC/4CC ANAEROBIC   Culture  Setup Time [id number]   Final    Culture     Final    Value:        BLOOD CULTURE RECEIVED NO GROWTH TO DATE CULTURE WILL BE HELD FOR 5 DAYS BEFORE ISSUING A FINAL NEGATIVE REPORT   Report Status PENDING   Incomplete     Medical History: Past Medical History  Diagnosis Date  . GERD (gastroesophageal reflux disease)   . Heart murmur   . Achalasia     S/p Heller myotomy 1991 and numerous dilations    Medications:  Scheduled:     . enoxaparin  40 mg Subcutaneous Q24H  . feeding supplement  1 Container Oral TID WC  . levofloxacin (LEVAQUIN) IV  750 mg Intravenous Q24H  . piperacillin-tazobactam (ZOSYN)  IV  3.375 g Intravenous Q8H  . vancomycin  1,000 mg Intravenous Q8H   Infusions:     . 0.9 % NaCl with KCl 20 mEq / L 75 mL/hr at 10/29/11 1858   Assessment:  41 year old female with recent hospitalization for aspiration pneumonia  VT = 18.9 mg/L after 1Gm IV q8h @ Css.  VT at goal.  Goal of Therapy:  Vancomycin trough level 15-20 mcg/ml  Plan:   Continue Vancomycin @ 1Gm IV q8h.  F/U SCr/Levels as needed.  Lorenza Evangelist 10/29/2011,11:59 PM

## 2011-10-29 NOTE — Procedures (Signed)
L thora Only was able to obtain 10 cc clear yellow fluid after 2 attempts. CXR pending

## 2011-10-29 NOTE — Progress Notes (Signed)
UR CHART REVIEWED; B Elior Robinette RN, BSN, MHA 

## 2011-10-29 NOTE — Progress Notes (Signed)
PATIENT DETAILS Name: [name] Age: 41 y.o. Sex: female Date of Birth: 04-Nov-1970 Admit Date: 10/27/2011  [id number] Not In System Gastroenterologist: Dr. Bosie Clos Surgeon: Dr. Daphine Deutscher  Emergency contact: Tobi, [place] Spouse [phone]   Medical Consults:  Dr. Luretha Murphy, Surgery  Interventional Radiology for therapeutic thoracentesis  Dr. Vida Rigger, Gastroenterology  Other Consults:  Dietician: Resource Breeze TID while on CL diet  Brief Narrative: Ms.  Chambers is a 41 year old female with a PMH of upper GI obstruction related to achalasia (originally diagnosed in 1988) treated with multiple dilatations as well as EGD with botox injections and a Heller myotomy in 1991.  Admitted on 10/18/11 through 10/23/11 with LLL lung collapse and treated for aspiration PNA.   Discharged on a course of Augmentin therapy, which she was still taking when her symptoms returned.  She was seen by both gastroenterology and surgery during that admission, and has scheduled her for a repeat myotomy 11/07/11.  Returned to the ER on 10/18/11 with recurrent fever and pleuritic chest pain, and found to have recurrent pleural effusion and aspiration pneumonia.  Interval History: [name] is scheduled for a therapeutic thoracentesis today.  ROS: [name] complains of pleuritic chest pain, dyspnea with activity and cough.  She is tearful and frustrated with her recurrent illness, and wants to have the surgery sooner.   Objective: Vital signs in last 24 hours: Temp:  [98.7 F (37.1 C)-99.9 F (37.7 C)] 99.9 F (37.7 C) (02/18 1012) Pulse Rate:  [95-107] 107  (02/18 1012) Resp:  [17-20] 20  (02/18 1012) BP: (102-111)/(59-73) 109/73 mmHg (02/18 1012) SpO2:  [95 %-97 %] 96 % (02/18 1012) Weight change:  Last BM Date: 10/27/11  Intake/Output from previous day:  Intake/Output Summary (Last 24 hours) at 10/29/11 1204 Last data filed at 10/29/11 0900  Gross per 24 hour  Intake   2115  ml  Output   1700 ml  Net    415 ml     Physical Exam:  Gen:  NAD Cardiovascular:  RRR, No M/R/G Respiratory: Lungs diminished Gastrointestinal: Abdomen soft, NT/ND with normal active bowel sounds. Extremities: No C/E/C     Lab Results: Basic Metabolic Panel:  Lab 10/29/11 3244 10/27/11 1953  NA 138 138  K 3.5 3.6  CL 102 101  CO2 26 27  GLUCOSE 98 99  BUN 5* 10  CREATININE 0.84 0.76  CALCIUM 8.9 9.5  MG -- --  PHOS -- --   GFR Estimated Creatinine Clearance: 112.1 ml/min (by C-G formula based on Cr of 0.84). Liver Function Tests:  Lab 10/27/11 1953  AST 45*  ALT 52*  ALKPHOS 80  BILITOT 0.3  PROT 8.3  ALBUMIN 3.2*    CBC:  Lab 10/29/11 0515 10/27/11 1953  WBC 13.7* 16.8*  NEUTROABS -- 13.8*  HGB 8.4* 8.5*  HCT 26.3* 26.3*  MCV 63.2* 62.8*  PLT 519* 574*   D-Dimer  Basename 10/27/11 1953  DDIMER 10.70*   Microbiology Recent Results (from the past 240 hour(s))  BODY FLUID CULTURE     Status: Normal   Collection Time   10/20/11  9:42 AM      Component Value Range Status Comment   Specimen Description PLEURAL   Final    Special Requests NONE   Final    Gram Stain     Final    Value: WBC PRESENT,BOTH PMN AND MONONUCLEAR     NO ORGANISMS SEEN   Culture NO GROWTH 3 DAYS  Final    Report Status 10/23/2011 FINAL   Final   CULTURE, SPUTUM-ASSESSMENT     Status: Normal   Collection Time   10/21/11  7:18 PM      Component Value Range Status Comment   Specimen Description SPUTUM   Final    Special Requests Normal   Final    Sputum evaluation     Final    Value: THIS SPECIMEN IS ACCEPTABLE. RESPIRATORY CULTURE REPORT TO FOLLOW.   Report Status 10/21/2011 FINAL   Final   CULTURE, RESPIRATORY     Status: Normal   Collection Time   10/21/11  7:18 PM      Component Value Range Status Comment   Specimen Description SPUTUM   Final    Special Requests NONE   Final    Gram Stain     Final    Value: RARE WBC PRESENT, PREDOMINANTLY PMN     FEW SQUAMOUS  EPITHELIAL CELLS PRESENT     RARE GRAM POSITIVE COCCI IN PAIRS AND CHAINS     IN CLUSTERS   Culture NORMAL OROPHARYNGEAL FLORA   Final    Report Status 10/24/2011 FINAL   Final   CULTURE, BLOOD (ROUTINE X 2)     Status: Normal (Preliminary result)   Collection Time   10/28/11  1:15 AM      Component Value Range Status Comment   Specimen Description BLOOD RIGHT ANTECUBITAL   Final    Special Requests BOTTLES DRAWN AEROBIC AND ANAEROBIC 5CC   Final    Culture  Setup Time [id number]   Final    Culture     Final    Value:        BLOOD CULTURE RECEIVED NO GROWTH TO DATE CULTURE WILL BE HELD FOR 5 DAYS BEFORE ISSUING A FINAL NEGATIVE REPORT   Report Status PENDING   Incomplete   CULTURE, BLOOD (ROUTINE X 2)     Status: Normal (Preliminary result)   Collection Time   10/28/11  1:25 AM      Component Value Range Status Comment   Specimen Description BLOOD RIGHT HAND   Final    Special Requests     Final    Value: BOTTLES DRAWN AEROBIC AND ANAEROBIC 5CC AEROBIC/4CC ANAEROBIC   Culture  Setup Time [id number]   Final    Culture     Final    Value:        BLOOD CULTURE RECEIVED NO GROWTH TO DATE CULTURE WILL BE HELD FOR 5 DAYS BEFORE ISSUING A FINAL NEGATIVE REPORT   Report Status PENDING   Incomplete       Studies/Results: [place] Chest  10/28/2011  *RADIOLOGY REPORT*  Clinical Data: Shortness of breath, right chest pain, previous left thoracentesis  CHEST ULTRASOUND  Comparison: 10/27/2011 and earlier studies  Findings: There is a moderate residual or recurrent left pleural effusion.  No right pleural  fluid is identified.  IMPRESSION:  1.  Moderate residual or recurrent left pleural effusion.  Original Report Authenticated By: Osa Craver, M.D.   Dg Chest Port 1 View  10/27/2011  *RADIOLOGY REPORT*  Clinical Data: Severe shortness of breath and history of pleural effusion and achalasia.  PORTABLE CHEST - 1 VIEW  Comparison: 10/22/2011 and CT from 10/18/2011 pain  Findings: Single  view of the chest again demonstrates an enlarged esophagus along the right side of the chest.  There is high-density material within the esophagus and similar to the prior examination. There are increased  densities at the left lung base suggestive for pleural fluid and possibly consolidation.  Slightly increased densities at the right lung base could represent atelectasis.  The upper lungs are clear.  The heart is obscured by the left basilar densities.  IMPRESSION: Increased densities at the left lung base could represent increased pleural fluid and consolidation.  Slightly increased densities in the right lower lung may represent volume loss.  Enlarged esophagus containing high-density material.  Findings compatible with history of achalasia.  Original Report Authenticated By: Richarda Overlie, M.D.    Medications: Scheduled Meds:    . enoxaparin  40 mg Subcutaneous Q24H  . feeding supplement  1 Container Oral TID WC  . levofloxacin (LEVAQUIN) IV  750 mg Intravenous Q24H  . piperacillin-tazobactam (ZOSYN)  IV  3.375 g Intravenous Q8H  . vancomycin  1,000 mg Intravenous Q8H   Continuous Infusions:    . 0.9 % NaCl with KCl 20 mEq / L 75 mL/hr at 10/28/11 2358   PRN Meds:.albuterol, benzonatate, HYDROcodone-acetaminophen, HYDROcodone-homatropine, ipratropium, LORazepam, morphine Antibiotics: Anti-infectives     Start     Dose/Rate Route Frequency Ordered Stop   10/28/11 0800   vancomycin (VANCOCIN) IVPB 1000 mg/200 mL premix        1,000 mg 200 mL/hr over 60 Minutes Intravenous Every 8 hours 10/28/11 0604     10/28/11 0800   piperacillin-tazobactam (ZOSYN) IVPB 3.375 g        3.375 g 12.5 mL/hr over 240 Minutes Intravenous Every 8 hours 10/28/11 0604     10/28/11 0700   Levofloxacin (LEVAQUIN) IVPB 750 mg        750 mg 100 mL/hr over 90 Minutes Intravenous Every 24 hours 10/28/11 0603             Assessment/Plan:  Principal Problem:  *Aspiration pneumonia Secondary to achalasia  induced dysphagia.  The patient failed outpatient therapy with Augmentin.  She is on empiric Levaquin, Zosyn and Vancomycin. Active Problems:  Achalasia Spoke with Dr. Daphine Deutscher who does not think they can move the surgery up any sooner as they had already moved it up, and he will need at least 4 hours of OR time.  Spoke with Dr. Ewing Schlein who will need 48 hour notice before the planned surgery to get EGD done to "clean out" esophagus the day before the surgery.    Leukocytosis Secondary to ongoing infection.  Thrombocytosis Likely reactive.  Pleural effusion, left For therapeutic thoracentesis.  Microcytic anemia Low iron but high ferritin suggestive of AOCD.  Likely menstrual losses contributing.    LOS: 2 days   Hillery Aldo, MD Pager [phone]  10/29/2011, 12:04 PM

## 2011-10-29 NOTE — Progress Notes (Signed)
I discussed the case with the surgeon Dr. Daphine Deutscher and the hospital team and believes the patient will be better served with an endoscopy the day prior to surgery to remove all residual food and then be kept n.p.o. Currently surgery is scheduled for next week but if the date changes please let us know so we can schedule the endoscopy accordingly. Please call sooner if we can be of any further assistance but consider putting her on clear liquids only and using TPN in the meantime particularly if she is not going home before surgery

## 2011-10-30 ENCOUNTER — Inpatient Hospital Stay (HOSPITAL_COMMUNITY): Payer: PRIVATE HEALTH INSURANCE

## 2011-10-30 DIAGNOSIS — R7989 Other specified abnormal findings of blood chemistry: Secondary | ICD-10-CM | POA: Diagnosis present

## 2011-10-30 LAB — CBC
HCT: 26 % — ABNORMAL LOW (ref 36.0–46.0)
Hemoglobin: 8.4 g/dL — ABNORMAL LOW (ref 12.0–15.0)
MCH: 20.4 pg — ABNORMAL LOW (ref 26.0–34.0)
MCHC: 32.3 g/dL (ref 30.0–36.0)
MCV: 63.3 fL — ABNORMAL LOW (ref 78.0–100.0)
Platelets: 498 10*3/uL — ABNORMAL HIGH (ref 150–400)
RBC: 4.11 MIL/uL (ref 3.87–5.11)
RDW: 15.3 % (ref 11.5–15.5)
WBC: 11.5 10*3/uL — ABNORMAL HIGH (ref 4.0–10.5)

## 2011-10-30 LAB — BASIC METABOLIC PANEL
BUN: 6 mg/dL (ref 6–23)
CO2: 25 mEq/L (ref 19–32)
Calcium: 9.2 mg/dL (ref 8.4–10.5)
Chloride: 107 mEq/L (ref 96–112)
Creatinine, Ser: 0.96 mg/dL (ref 0.50–1.10)
GFR calc Af Amer: 84 mL/min — ABNORMAL LOW (ref >90)
GFR calc non Af Amer: 72 mL/min — ABNORMAL LOW (ref >90)
Glucose, Bld: 95 mg/dL (ref 70–99)
Potassium: 4 mEq/L (ref 3.5–5.1)
Sodium: 142 mEq/L (ref 135–145)

## 2011-10-30 MED ORDER — IOHEXOL 300 MG/ML  SOLN
100.0000 mL | Freq: Once | INTRAMUSCULAR | Status: AC | PRN
  Administered 2011-10-30: 100 mL via INTRAVENOUS

## 2011-10-30 NOTE — Progress Notes (Signed)
PATIENT DETAILS Name: [name] Age: 41 y.o. Sex: female Date of Birth: 07-22-1971 Admit Date: 10/27/2011  [id number] Not In System Gastroenterologist: Dr. Bosie Clos Surgeon: Dr. Daphine Deutscher  Emergency contact: [place], [place] Spouse [phone]   Medical Consults:  Dr. Luretha Murphy, Surgery  Interventional Radiology for therapeutic thoracentesis  Dr. Vida Rigger, Gastroenterology  Other Consults:  Dietician: Resource Breeze TID while on CL diet  Brief Narrative: [name] is a 41 year old female with a PMH of upper GI obstruction related to achalasia (originally diagnosed in 1988) treated with multiple dilatations as well as EGD with botox injections and a Heller myotomy in 1991.  Admitted on 10/18/11 through 10/23/11 with LLL lung collapse and treated for aspiration PNA.   Discharged on a course of Augmentin therapy, which she was still taking when her symptoms returned.  She was seen by both gastroenterology and surgery during that admission, and has scheduled her for a repeat myotomy 11/07/11.  Returned to the ER on 10/18/11 with recurrent fever and pleuritic chest pain, and found to have recurrent pleural effusion and aspiration pneumonia.  Attempts to perform a therapeutic thoracentesis were not successful.  Plan is to proceed with CT angiogram of chest to rule out PE/empyema.  Interval History: [name] was stable overnight.    ROS: [name] states her chest pain has declined from a level 10 yesterday to a level 4 today.  She still has dyspnea with exertion.    Objective: Vital signs in last 24 hours: Temp:  [99 F (37.2 C)-99.8 F (37.7 C)] 99.7 F (37.6 C) (02/19 0606) Pulse Rate:  [92-105] 97  (02/19 0606) Resp:  [18-22] 20  (02/19 0606) BP: (107-136)/(68-78) 113/78 mmHg (02/19 0606) SpO2:  [93 %-95 %] 95 % (02/19 0606) Weight change:  Last BM Date: 10/30/11  Intake/Output from previous day:  Intake/Output Summary (Last 24 hours) at 10/30/11 1146 Last  data filed at 10/30/11 1033  Gross per 24 hour  Intake   3050 ml  Output   3800 ml  Net   -750 ml     Physical Exam:  Gen:  NAD Cardiovascular:  RRR, No M/R/G Respiratory: Lungs diminished Gastrointestinal: Abdomen soft, NT/ND with normal active bowel sounds. Extremities: No C/E/C     Lab Results: Basic Metabolic Panel:  Lab 10/30/11 7846 10/29/11 0515 10/27/11 1953  NA 142 138 138  K 4.0 3.5 --  CL 107 102 101  CO2 25 26 27   GLUCOSE 95 98 99  BUN 6 5* 10  CREATININE 0.96 0.84 0.76  CALCIUM 9.2 8.9 9.5  MG -- -- --  PHOS -- -- --   GFR Estimated Creatinine Clearance: 98.1 ml/min (by C-G formula based on Cr of 0.96). Liver Function Tests:  Lab 10/27/11 1953  AST 45*  ALT 52*  ALKPHOS 80  BILITOT 0.3  PROT 8.3  ALBUMIN 3.2*    CBC:  Lab 10/30/11 0430 10/29/11 0515 10/27/11 1953  WBC 11.5* 13.7* 16.8*  NEUTROABS -- -- 13.8*  HGB 8.4* 8.4* 8.5*  HCT 26.0* 26.3* 26.3*  MCV 63.3* 63.2* 62.8*  PLT 498* 519* 574*   D-Dimer  Basename 10/27/11 1953  DDIMER 10.70*   Microbiology Recent Results (from the past 240 hour(s))  CULTURE, SPUTUM-ASSESSMENT     Status: Normal   Collection Time   10/21/11  7:18 PM      Component Value Range Status Comment   Specimen Description SPUTUM   Final    Special Requests Normal  Final    Sputum evaluation     Final    Value: THIS SPECIMEN IS ACCEPTABLE. RESPIRATORY CULTURE REPORT TO FOLLOW.   Report Status 10/21/2011 FINAL   Final   CULTURE, RESPIRATORY     Status: Normal   Collection Time   10/21/11  7:18 PM      Component Value Range Status Comment   Specimen Description SPUTUM   Final    Special Requests NONE   Final    Gram Stain     Final    Value: RARE WBC PRESENT, PREDOMINANTLY PMN     FEW SQUAMOUS EPITHELIAL CELLS PRESENT     RARE GRAM POSITIVE COCCI IN PAIRS AND CHAINS     IN CLUSTERS   Culture NORMAL OROPHARYNGEAL FLORA   Final    Report Status 10/24/2011 FINAL   Final   CULTURE, BLOOD (ROUTINE X 2)      Status: Normal (Preliminary result)   Collection Time   10/28/11  1:15 AM      Component Value Range Status Comment   Specimen Description BLOOD RIGHT ANTECUBITAL   Final    Special Requests BOTTLES DRAWN AEROBIC AND ANAEROBIC 5CC   Final    Culture  Setup Time [id number]   Final    Culture     Final    Value:        BLOOD CULTURE RECEIVED NO GROWTH TO DATE CULTURE WILL BE HELD FOR 5 DAYS BEFORE ISSUING A FINAL NEGATIVE REPORT   Report Status PENDING   Incomplete   CULTURE, BLOOD (ROUTINE X 2)     Status: Normal (Preliminary result)   Collection Time   10/28/11  1:25 AM      Component Value Range Status Comment   Specimen Description BLOOD RIGHT HAND   Final    Special Requests     Final    Value: BOTTLES DRAWN AEROBIC AND ANAEROBIC 5CC AEROBIC/4CC ANAEROBIC   Culture  Setup Time [id number]   Final    Culture     Final    Value:        BLOOD CULTURE RECEIVED NO GROWTH TO DATE CULTURE WILL BE HELD FOR 5 DAYS BEFORE ISSUING A FINAL NEGATIVE REPORT   Report Status PENDING   Incomplete   CULTURE, ROUTINE-ABSCESS     Status: Normal (Preliminary result)   Collection Time   10/29/11  3:19 PM      Component Value Range Status Comment   Specimen Description ABSCESS   Final    Special Requests NONE   Final    Gram Stain     Final    Value: FEW WBC PRESENT,BOTH PMN AND MONONUCLEAR     NO SQUAMOUS EPITHELIAL CELLS SEEN     NO ORGANISMS SEEN   Culture NO GROWTH   Final    Report Status PENDING   Incomplete       Studies/Results: Dg Chest 1 View  10/29/2011  *RADIOLOGY REPORT*  Clinical Data: Left thoracentesis.  CHEST - 1 VIEW  Comparison: 10/27/2011.  Findings: Trachea is midline.  Heart is enlarged, stable.  There is a large left pleural effusion with left basilar airspace disease. No pneumothorax after left thoracentesis.  Dilated and food- containing esophagus is seen along the medial right hemithorax. Probable right basilar atelectasis or scarring.  IMPRESSION:  1.  No pneumothorax  after left thoracentesis. 2.  Large left pleural effusion with left basilar airspace disease. 3.  Right basilar atelectasis or scarring. 4.  Achalasia.  Original Report Authenticated By: Reyes Ivan, M.D.   US Thoracentesis Asp Pleural Space W/img Guide  10/29/2011  *RADIOLOGY REPORT*  Clinical Data:  Left pleural effusion  ULTRASOUND GUIDED left THORACENTESIS  Comparison:  10/20/2011  An ultrasound guided thoracentesis was thoroughly discussed with the patient and questions answered.  The benefits, risks, alternatives and complications were also discussed.  The patient understands and wishes to proceed with the procedure.  Written consent was obtained.  Ultrasound was performed to localize and mark an adequate pocket of fluid in the left chest.  The area was then prepped and draped in the normal sterile fashion.  1% Lidocaine was used for local anesthesia.  Under ultrasound guidance a 19 gauge Yueh catheter was introduced.  Thoracentesis was performed. Only five ml of fluid were obtained. The procedure was repeated and additional time with a new Yeuh angiocath.  Again, only 5 ml were obtained.  The catheter was removed and a dressing applied.  Complications:  None  Findings: A total of approximately 10 ml of clear yellow fluid was removed. A fluid sample was sent for laboratory analysis.  IMPRESSION: Successful ultrasound guided left thoracentesis yielding 10 ml of pleural fluid.  Original Report Authenticated By: Donavan Burnet, M.D.    Medications: Scheduled Meds:    . enoxaparin  40 mg Subcutaneous Q24H  . feeding supplement  1 Container Oral TID WC  . levofloxacin (LEVAQUIN) IV  750 mg Intravenous Q24H  . piperacillin-tazobactam (ZOSYN)  IV  3.375 g Intravenous Q8H  . vancomycin  1,000 mg Intravenous Q8H   Continuous Infusions:    . 0.9 % NaCl with KCl 20 mEq / L 75 mL/hr at 10/29/11 1858   PRN Meds:.albuterol, benzonatate, HYDROcodone-acetaminophen, HYDROcodone-homatropine,  ipratropium, LORazepam, morphine Antibiotics: Anti-infectives     Start     Dose/Rate Route Frequency Ordered Stop   10/28/11 0800   vancomycin (VANCOCIN) IVPB 1000 mg/200 mL premix        1,000 mg 200 mL/hr over 60 Minutes Intravenous Every 8 hours 10/28/11 0604     10/28/11 0800   piperacillin-tazobactam (ZOSYN) IVPB 3.375 g        3.375 g 12.5 mL/hr over 240 Minutes Intravenous Every 8 hours 10/28/11 0604     10/28/11 0700   Levofloxacin (LEVAQUIN) IVPB 750 mg        750 mg 100 mL/hr over 90 Minutes Intravenous Every 24 hours 10/28/11 0603             Assessment/Plan:  Principal Problem:  *Aspiration pneumonia Secondary to achalasia induced dysphagia.  The patient failed outpatient therapy with Augmentin.  She is on empiric Levaquin, Zosyn and Vancomycin.   Active Problems:  Achalasia Spoke with Dr. Daphine Deutscher who does not think they can move the surgery up any sooner as they had already moved it up, and he will need at least 4 hours of OR time.  Spoke with Dr. Ewing Schlein who will need 48 hour notice before the planned surgery to get EGD done to "clean out" esophagus the day before the surgery.    Leukocytosis Secondary to ongoing infection.  Decreasing on antibiotics.  Thrombocytosis Likely reactive.  Pleural effusion, left Therapeutic thoracentesis unsuccessful 10/29/11.  Will get CT chest to evaluate for empyema.  May need CVTS consult for chest tube if condition deteriorates further.  Microcytic anemia Low iron but high ferritin suggestive of AOCD.  Likely menstrual losses contributing.  Elevated D-Dimer D-Dimer not done during most recent hospital stay so no  comparison.   CT chest done on 10/18/11 negative for PE.  Will repeat CT chest to rule out pulmonary embolism.    LOS: 3 days   Hillery Aldo, MD Pager [phone]  10/30/2011, 11:46 AM

## 2011-10-31 LAB — CBC
HCT: 25.1 % — ABNORMAL LOW (ref 36.0–46.0)
Hemoglobin: 8.1 g/dL — ABNORMAL LOW (ref 12.0–15.0)
MCH: 20.2 pg — ABNORMAL LOW (ref 26.0–34.0)
MCHC: 32.3 g/dL (ref 30.0–36.0)
MCV: 62.6 fL — ABNORMAL LOW (ref 78.0–100.0)
Platelets: 454 10*3/uL — ABNORMAL HIGH (ref 150–400)
RBC: 4.01 MIL/uL (ref 3.87–5.11)
RDW: 15.4 % (ref 11.5–15.5)
WBC: 9.8 10*3/uL (ref 4.0–10.5)

## 2011-10-31 LAB — BASIC METABOLIC PANEL
BUN: 5 mg/dL — ABNORMAL LOW (ref 6–23)
CO2: 25 mEq/L (ref 19–32)
Calcium: 9 mg/dL (ref 8.4–10.5)
Chloride: 108 mEq/L (ref 96–112)
Creatinine, Ser: 1.01 mg/dL (ref 0.50–1.10)
GFR calc Af Amer: 79 mL/min — ABNORMAL LOW (ref >90)
GFR calc non Af Amer: 68 mL/min — ABNORMAL LOW (ref >90)
Glucose, Bld: 96 mg/dL (ref 70–99)
Potassium: 3.9 mEq/L (ref 3.5–5.1)
Sodium: 143 mEq/L (ref 135–145)

## 2011-10-31 NOTE — Progress Notes (Signed)
Subjective: Patient had an episode of vomiting today.  No other specific complaints.  Objective: Vital signs in last 24 hours: Filed Vitals:   10/30/11 1312 10/30/11 2203 10/31/11 0613 10/31/11 1028  BP: 118/79 125/79 120/79 130/82  Pulse: 93 92 89 100  Temp: 98.7 F (37.1 C) 99.3 F (37.4 C) 98.8 F (37.1 C) 98.1 F (36.7 C)  TempSrc: Oral Oral Oral Oral  Resp: 18 32 20 20  Height:      Weight:      SpO2: 96% 96% 96% 99%   Weight change:   Intake/Output Summary (Last 24 hours) at 10/31/11 1629 Last data filed at 10/31/11 1608  Gross per 24 hour  Intake 2896.25 ml  Output   2675 ml  Net 221.25 ml    Physical Exam: General: Awake, Oriented, No acute distress. HEENT: EOMI. Neck: Supple CV: S1 and S2 Lungs: Crackles bilaterally, decreased breath sounds at left lung base. Abdomen: Soft, Nontender, Nondistended, +bowel sounds. Ext: Good pulses. Trace edema.  Lab Results:  Basename 10/31/11 0519 10/30/11 0430  NA 143 142  K 3.9 4.0  CL 108 107  CO2 25 25  GLUCOSE 96 95  BUN 5* 6  CREATININE 1.01 0.96  CALCIUM 9.0 9.2  MG -- --  PHOS -- --   No results found for this basename: AST:2,ALT:2,ALKPHOS:2,BILITOT:2,PROT:2,ALBUMIN:2 in the last 72 hours No results found for this basename: LIPASE:2,AMYLASE:2 in the last 72 hours  Basename 10/31/11 0519 10/30/11 0430  WBC 9.8 11.5*  NEUTROABS -- --  HGB 8.1* 8.4*  HCT 25.1* 26.0*  MCV 62.6* 63.3*  PLT 454* 498*   No results found for this basename: CKTOTAL:3,CKMB:3,CKMBINDEX:3,TROPONINI:3 in the last 72 hours No components found with this basename: POCBNP:3 No results found for this basename: DDIMER:2 in the last 72 hours No results found for this basename: HGBA1C:2 in the last 72 hours No results found for this basename: CHOL:2,HDL:2,LDLCALC:2,TRIG:2,CHOLHDL:2,LDLDIRECT:2 in the last 72 hours No results found for this basename: TSH,T4TOTAL,FREET3,T3FREE,THYROIDAB in the last 72 hours No results found for this  basename: VITAMINB12:2,FOLATE:2,FERRITIN:2,TIBC:2,IRON:2,RETICCTPCT:2 in the last 72 hours  Micro Results: Recent Results (from the past 240 hour(s))  CULTURE, SPUTUM-ASSESSMENT     Status: Normal   Collection Time   10/21/11  7:18 PM      Component Value Range Status Comment   Specimen Description SPUTUM   Final    Special Requests Normal   Final    Sputum evaluation     Final    Value: THIS SPECIMEN IS ACCEPTABLE. RESPIRATORY CULTURE REPORT TO FOLLOW.   Report Status 10/21/2011 FINAL   Final   CULTURE, RESPIRATORY     Status: Normal   Collection Time   10/21/11  7:18 PM      Component Value Range Status Comment   Specimen Description SPUTUM   Final    Special Requests NONE   Final    Gram Stain     Final    Value: RARE WBC PRESENT, PREDOMINANTLY PMN     FEW SQUAMOUS EPITHELIAL CELLS PRESENT     RARE GRAM POSITIVE COCCI IN PAIRS AND CHAINS     IN CLUSTERS   Culture NORMAL OROPHARYNGEAL FLORA   Final    Report Status 10/24/2011 FINAL   Final   CULTURE, BLOOD (ROUTINE X 2)     Status: Normal (Preliminary result)   Collection Time   10/28/11  1:15 AM      Component Value Range Status Comment   Specimen Description BLOOD RIGHT  ANTECUBITAL   Final    Special Requests BOTTLES DRAWN AEROBIC AND ANAEROBIC 5CC   Final    Culture  Setup Time [id number]   Final    Culture     Final    Value:        BLOOD CULTURE RECEIVED NO GROWTH TO DATE CULTURE WILL BE HELD FOR 5 DAYS BEFORE ISSUING A FINAL NEGATIVE REPORT   Report Status PENDING   Incomplete   CULTURE, BLOOD (ROUTINE X 2)     Status: Normal (Preliminary result)   Collection Time   10/28/11  1:25 AM      Component Value Range Status Comment   Specimen Description BLOOD RIGHT HAND   Final    Special Requests     Final    Value: BOTTLES DRAWN AEROBIC AND ANAEROBIC 5CC AEROBIC/4CC ANAEROBIC   Culture  Setup Time [id number]   Final    Culture     Final    Value:        BLOOD CULTURE RECEIVED NO GROWTH TO DATE CULTURE WILL BE HELD FOR  5 DAYS BEFORE ISSUING A FINAL NEGATIVE REPORT   Report Status PENDING   Incomplete   CULTURE, ROUTINE-ABSCESS     Status: Normal (Preliminary result)   Collection Time   10/29/11  3:19 PM      Component Value Range Status Comment   Specimen Description ABSCESS   Final    Special Requests NONE   Final    Gram Stain     Final    Value: FEW WBC PRESENT,BOTH PMN AND MONONUCLEAR     NO SQUAMOUS EPITHELIAL CELLS SEEN     NO ORGANISMS SEEN   Culture NO GROWTH 1 DAY   Final    Report Status PENDING   Incomplete     Studies/Results: Ct Angio Chest W/cm &/or Wo Cm  10/30/2011  *RADIOLOGY REPORT*  Clinical Data: Shortness of breath, mid chest pressure, high D- dimer  CT ANGIOGRAPHY CHEST  Technique:  Multidetector CT imaging of the chest using the standard protocol during bolus administration of intravenous contrast. Multiplanar reconstructed images including MIPs were obtained and reviewed to evaluate the vascular anatomy.  Contrast: OMNIPAQUE IOHEXOL 300 MG/ML IV SOLN  Comparison: Portable chest x-ray of 10/29/2011 and CT chest of 10/18/2011  Findings: The pulmonary arteries opacify well and there is no evidence of acute pulmonary embolism.  Some respiratory motion does obscure detail in the lung bases.  The thoracic aorta opacifies with no acute abnormality.  There are mediastinal nodes present which are not pathologically enlarged.  There is cardiomegaly present, and there does appear to be a small pericardial effusion present.  There is a moderately large left pleural effusion present with atelectasis of much of the left lower lobe.  No central endobronchial lesion is seen.  This atelectasis may be compressive as a result of the left pleural effusion. The esophagus is again noted to be diffusely dilated with food particles, in this patient with history of achalasia.  This appearance is unchanged compared to prior CTs.  On the lung window images compressive atelectasis of the left lower lobe is  noted.  No lung nodule or infiltrate is seen.  The moderately large left pleural effusion again is noted.  No bony abnormality is seen.  IMPRESSION:  1.  No evidence of acute pulmonary embolism. 2.  Moderately large left pleural effusion with probable compressive atelectasis of the left lower lobe.  No central endobronchial lesion is seen. 3.  Cardiomegaly and small pericardial effusion. 4.  Changes of achalasia again are noted.  Original Report Authenticated By: Juline Patch, M.D.    Medications: I have reviewed the patient's current medications. Scheduled Meds:   . enoxaparin  40 mg Subcutaneous Q24H  . feeding supplement  1 Container Oral TID WC  . levofloxacin (LEVAQUIN) IV  750 mg Intravenous Q24H  . piperacillin-tazobactam (ZOSYN)  IV  3.375 g Intravenous Q8H  . vancomycin  1,000 mg Intravenous Q8H   Continuous Infusions:   . 0.9 % NaCl with KCl 20 mEq / L 75 mL/hr at 10/31/11 0325   PRN Meds:.albuterol, benzonatate, HYDROcodone-acetaminophen, HYDROcodone-homatropine, ipratropium, LORazepam, morphine  Assessment/Plan: Aspiration pneumonia  Secondary to achalasia induced dysphagia. The patient failed outpatient therapy with Augmentin. She is on empiric Levaquin, Zosyn and Vancomycin.  Antibiotics since 10/28/2011.  Discontinue vancomycin.  Continue levofloxacin and Zosyn.  Achalasia  Plan for surgery on 11/07/2011 with Dr. Daphine Deutscher. Dr. Ewing Schlein will need 48 hour notice before the planned surgery to get EGD done to "clean out" esophagus the day before the surgery.   Leukocytosis  Secondary to ongoing infection.  Resolved on antibiotics.   Thrombocytosis  Likely reactive.   Pleural effusion, left  Therapeutic thoracentesis unsuccessful 10/29/11, may be loculated.  Patient's breathing is improved as a result do not suspect the patient needs a chest tube at this time.  Spoke to Dr. Daphine Deutscher with general surgery who indicated that if the patient needs a chest tube he can arrange for that if  needed prior to surgery.  Microcytic anemia  Low iron but high ferritin suggestive of anemia of chronic disease.  Hemoglobin stable. Likely menstrual losses contributing.   Elevated D-Dimer  CT shows no evidence of acute pulmonary embolism.  Moderately large left pleural effusion with compressive atelectasis of the left lower lobe.  Prophylaxis Lovenox  Disposition Pending   LOS: 4 days  Edouard Gikas A, MD 10/31/2011, 4:29 PM

## 2011-10-31 NOTE — Progress Notes (Signed)
Pt vomited lumpy emesis, pink/beige in color.  Appears as chunky, like pt has had solid food.  But pt is on clear liquids.

## 2011-10-31 NOTE — Progress Notes (Signed)
PROVIDED PATIENT W/[facility] TEL# FOR PCP.HAS PHARMACY,TRANSP.

## 2011-11-01 ENCOUNTER — Other Ambulatory Visit (INDEPENDENT_AMBULATORY_CARE_PROVIDER_SITE_OTHER): Payer: Self-pay | Admitting: Surgery

## 2011-11-01 DIAGNOSIS — J90 Pleural effusion, not elsewhere classified: Secondary | ICD-10-CM

## 2011-11-01 DIAGNOSIS — K220 Achalasia of cardia: Secondary | ICD-10-CM

## 2011-11-01 LAB — CREATININE, SERUM
Creatinine, Ser: 0.96 mg/dL (ref 0.50–1.10)
GFR calc Af Amer: 84 mL/min — ABNORMAL LOW (ref >90)
GFR calc non Af Amer: 72 mL/min — ABNORMAL LOW (ref >90)

## 2011-11-01 MED ORDER — ONDANSETRON HCL 4 MG/2ML IJ SOLN
4.0000 mg | Freq: Four times a day (QID) | INTRAMUSCULAR | Status: DC | PRN
  Administered 2011-11-01 – 2011-11-06 (×2): 4 mg via INTRAVENOUS
  Filled 2011-11-01 (×2): qty 2

## 2011-11-01 NOTE — Progress Notes (Signed)
Pt. Requests that her diet be changed to full liquid due to not liking items on clear liquid diet.

## 2011-11-01 NOTE — Progress Notes (Signed)
ANTIBIOTIC CONSULT NOTE - F/U  Pharmacy Consult for Zosyn & Levaquin  Indication: Suspected Hospital Acquired Pneumonia  No Known Allergies  Patient Measurements: Height: 6' 0.05" (183 cm) Weight: 202 lb 2.6 oz (91.7 kg) IBW/kg (Calculated) : 73.21   Vital Signs: Temp: 98.3 F (36.8 C) (02/21 0502) Temp src: Oral (02/21 0502) BP: 130/84 mmHg (02/21 0502) Pulse Rate: 81  (02/21 0502) Intake/Output from previous day: 02/20 0701 - 02/21 0700 In: 2183.8 [P.O.:1125; I.V.:958.8; IV Piggyback:100] Out: 2525 [Urine:2525] Intake/Output from this shift: Total I/O In: 240 [P.O.:240] Out: 601 [Urine:600; Stool:1]  Labs:  [facility] 11/01/11 0320 10/31/11 0519 10/30/11 0430  WBC -- 9.8 11.5*  HGB -- 8.1* 8.4*  PLT -- 454* 498*  LABCREA -- -- --  CREATININE 0.96 1.01 0.96   Estimated Creatinine Clearance: 98.1 ml/min (by C-G formula based on Cr of 0.96).  Basename 10/29/11 2246  VANCOTROUGH 18.9  VANCOPEAK --  VANCORANDOM --  GENTTROUGH --  GENTPEAK --  GENTRANDOM --  TOBRATROUGH --  TOBRAPEAK --  TOBRARND --  AMIKACINPEAK --  AMIKACINTROU --  AMIKACIN --     Microbiology: Recent Results (from the past 720 hour(s))  URINE CULTURE     Status: Normal   Collection Time   10/18/11 10:46 PM      Component Value Range Status Comment   Specimen Description URINE, CLEAN CATCH   Final    Special Requests Normal   Final    Culture  Setup Time [id number]   Final    Colony Count 20,OOO COLONIES/ML   Final    Culture     Final    Value: Multiple bacterial morphotypes present, none predominant. Suggest appropriate recollection if clinically indicated.   Report Status 10/20/2011 FINAL   Final   CULTURE, BLOOD (ROUTINE X 2)     Status: Normal   Collection Time   10/18/11 11:20 PM      Component Value Range Status Comment   Specimen Description BLOOD LEFT HAND   Final    Special Requests BOTTLES DRAWN AEROBIC AND ANAEROBIC [facility]   Final    Culture  Setup Time [id number]   Final      Culture NO GROWTH 5 DAYS   Final    Report Status 10/25/2011 FINAL   Final   CULTURE, BLOOD (ROUTINE X 2)     Status: Normal   Collection Time   10/18/11 11:25 PM      Component Value Range Status Comment   Specimen Description BLOOD RIGHT HAND   Final    Special Requests BOTTLES DRAWN AEROBIC AND ANAEROBIC 2.5CC   Final    Culture  Setup Time [id number]   Final    Culture NO GROWTH 5 DAYS   Final    Report Status 10/25/2011 FINAL   Final   BODY FLUID CULTURE     Status: Normal   Collection Time   10/20/11  9:42 AM      Component Value Range Status Comment   Specimen Description PLEURAL   Final    Special Requests NONE   Final    Gram Stain     Final    Value: WBC PRESENT,BOTH PMN AND MONONUCLEAR     NO ORGANISMS SEEN   Culture NO GROWTH 3 DAYS   Final    Report Status 10/23/2011 FINAL   Final   CULTURE, SPUTUM-ASSESSMENT     Status: Normal   Collection Time   10/21/11  7:18 PM  Component Value Range Status Comment   Specimen Description SPUTUM   Final    Special Requests Normal   Final    Sputum evaluation     Final    Value: THIS SPECIMEN IS ACCEPTABLE. RESPIRATORY CULTURE REPORT TO FOLLOW.   Report Status 10/21/2011 FINAL   Final   CULTURE, RESPIRATORY     Status: Normal   Collection Time   10/21/11  7:18 PM      Component Value Range Status Comment   Specimen Description SPUTUM   Final    Special Requests NONE   Final    Gram Stain     Final    Value: RARE WBC PRESENT, PREDOMINANTLY PMN     FEW SQUAMOUS EPITHELIAL CELLS PRESENT     RARE GRAM POSITIVE COCCI IN PAIRS AND CHAINS     IN CLUSTERS   Culture NORMAL OROPHARYNGEAL FLORA   Final    Report Status 10/24/2011 FINAL   Final   CULTURE, BLOOD (ROUTINE X 2)     Status: Normal (Preliminary result)   Collection Time   10/28/11  1:15 AM      Component Value Range Status Comment   Specimen Description BLOOD RIGHT ANTECUBITAL   Final    Special Requests BOTTLES DRAWN AEROBIC AND ANAEROBIC 5CC   Final    Culture   Setup Time [id number]   Final    Culture     Final    Value:        BLOOD CULTURE RECEIVED NO GROWTH TO DATE CULTURE WILL BE HELD FOR 5 DAYS BEFORE ISSUING A FINAL NEGATIVE REPORT   Report Status PENDING   Incomplete   CULTURE, BLOOD (ROUTINE X 2)     Status: Normal (Preliminary result)   Collection Time   10/28/11  1:25 AM      Component Value Range Status Comment   Specimen Description BLOOD RIGHT HAND   Final    Special Requests     Final    Value: BOTTLES DRAWN AEROBIC AND ANAEROBIC 5CC AEROBIC/4CC ANAEROBIC   Culture  Setup Time [id number]   Final    Culture     Final    Value:        BLOOD CULTURE RECEIVED NO GROWTH TO DATE CULTURE WILL BE HELD FOR 5 DAYS BEFORE ISSUING A FINAL NEGATIVE REPORT   Report Status PENDING   Incomplete   CULTURE, ROUTINE-ABSCESS     Status: Normal (Preliminary result)   Collection Time   10/29/11  3:19 PM      Component Value Range Status Comment   Specimen Description ABSCESS   Final    Special Requests NONE   Final    Gram Stain     Final    Value: FEW WBC PRESENT,BOTH PMN AND MONONUCLEAR     NO SQUAMOUS EPITHELIAL CELLS SEEN     NO ORGANISMS SEEN   Culture NO GROWTH 2 DAYS   Final    Report Status PENDING   Incomplete    Medical History: Past Medical History  Diagnosis Date  . GERD (gastroesophageal reflux disease)   . Heart murmur   . Achalasia     S/p Heller myotomy 1991 and numerous dilations   Medications:  Scheduled:     . enoxaparin  40 mg Subcutaneous Q24H  . feeding supplement  1 Container Oral TID WC  . levofloxacin (LEVAQUIN) IV  750 mg Intravenous Q24H  . piperacillin-tazobactam (ZOSYN)  IV  3.375 g Intravenous Q8H  .  DISCONTD: vancomycin  1,000 mg Intravenous Q8H   Infusions:     . 0.9 % NaCl with KCl 20 mEq / L 75 mL/hr at 10/31/11 2032   Assessment:  41 year old female with recent hospitalization for aspiration pneumonia.  Day 5 empiric Zosyn and Levaquin.  Afebrile  SCr stable.  WBCs  improved.  Cultures negative to date.  Goal of Therapy:  Appropriate regimens for patient characteristics.  Plan:   Cont Levaquin 750mg  IV q24h  Cont Zosyn 3.375g IV Q8H infused over 4hrs.  F/u daily.  Hopefully can de-escalate soon.  Reece Packer 11/01/2011,1:34 PM

## 2011-11-01 NOTE — Progress Notes (Signed)
   CARE MANAGEMENT NOTE 11/01/2011  Patient:  BISMA, KLETT   Account Number:  [id number]  Date Initiated:  10/29/2011  Documentation initiated by:  Jiles Crocker  Subjective/Objective Assessment:   ADMITTED WITH PNEUMONIA     Action/Plan:   PCP IS DR Shana Chute; LIVES AT HOME WITH SPOUSE   Anticipated DC Date:  11/08/2011   Anticipated DC Plan:  HOME/SELF CARE         Choice offered to / List presented to:             Status of service:  In process, will continue to follow Medicare Important Message given?   (If response is "NO", the following Medicare IM given date fields will be blank) Date Medicare IM given:   Date Additional Medicare IM given:    Discharge Disposition:    Per UR Regulation:  Reviewed for med. necessity/level of care/duration of stay  Comments:  11/01/11 Jolette Lana RN,BSN NCM [phone] NOTED SX FOLLOWING FOR SX NEXT WEEK.NOTED LARGE PLEURAL EFFUSION, WILL NEED TAP PRIOR SX.PROVIDED PATIENT W/PCP [facility].  10/29/2011- Sofie Rower RN, BSN, MHA

## 2011-11-01 NOTE — Progress Notes (Signed)
Subjective: Continues to feel nauseated with vomiting.  Breathing better.  Objective: Vital signs in last 24 hours: Filed Vitals:   10/31/11 1650 10/31/11 2251 11/01/11 0502 11/01/11 1345  BP: 126/84 127/88 130/84 138/89  Pulse: 85 86 81 78  Temp: 98.5 F (36.9 C) 98.1 F (36.7 C) 98.3 F (36.8 C) 98.5 F (36.9 C)  TempSrc: Oral Oral Oral Oral  Resp: 16 20 20 18   Height:      Weight:      SpO2: 95% 95% 94% 98%   Weight change:   Intake/Output Summary (Last 24 hours) at 11/01/11 1620 Last data filed at 11/01/11 1345  Gross per 24 hour  Intake 2098.75 ml  Output   2901 ml  Net -802.25 ml    Physical Exam: General: Awake, Oriented, No acute distress. HEENT: EOMI. Neck: Supple CV: S1 and S2 Lungs: Crackles bilaterally, decreased breath sounds at left lung base. Abdomen: Soft, Nontender, Nondistended, +bowel sounds. Ext: Good pulses. Trace edema.  Lab Results:  Basename 11/01/11 0320 10/31/11 0519 10/30/11 0430  NA -- 143 142  K -- 3.9 4.0  CL -- 108 107  CO2 -- 25 25  GLUCOSE -- 96 95  BUN -- 5* 6  CREATININE 0.96 1.01 --  CALCIUM -- 9.0 9.2  MG -- -- --  PHOS -- -- --   No results found for this basename: AST:2,ALT:2,ALKPHOS:2,BILITOT:2,PROT:2,ALBUMIN:2 in the last 72 hours No results found for this basename: LIPASE:2,AMYLASE:2 in the last 72 hours  Basename 10/31/11 0519 10/30/11 0430  WBC 9.8 11.5*  NEUTROABS -- --  HGB 8.1* 8.4*  HCT 25.1* 26.0*  MCV 62.6* 63.3*  PLT 454* 498*   No results found for this basename: CKTOTAL:3,CKMB:3,CKMBINDEX:3,TROPONINI:3 in the last 72 hours No components found with this basename: POCBNP:3 No results found for this basename: DDIMER:2 in the last 72 hours No results found for this basename: HGBA1C:2 in the last 72 hours No results found for this basename: CHOL:2,HDL:2,LDLCALC:2,TRIG:2,CHOLHDL:2,LDLDIRECT:2 in the last 72 hours No results found for this basename: TSH,T4TOTAL,FREET3,T3FREE,THYROIDAB in the last 72  hours No results found for this basename: VITAMINB12:2,FOLATE:2,FERRITIN:2,TIBC:2,IRON:2,RETICCTPCT:2 in the last 72 hours  Micro Results: Recent Results (from the past 240 hour(s))  CULTURE, BLOOD (ROUTINE X 2)     Status: Normal (Preliminary result)   Collection Time   10/28/11  1:15 AM      Component Value Range Status Comment   Specimen Description BLOOD RIGHT ANTECUBITAL   Final    Special Requests BOTTLES DRAWN AEROBIC AND ANAEROBIC 5CC   Final    Culture  Setup Time [id number]   Final    Culture     Final    Value:        BLOOD CULTURE RECEIVED NO GROWTH TO DATE CULTURE WILL BE HELD FOR 5 DAYS BEFORE ISSUING A FINAL NEGATIVE REPORT   Report Status PENDING   Incomplete   CULTURE, BLOOD (ROUTINE X 2)     Status: Normal (Preliminary result)   Collection Time   10/28/11  1:25 AM      Component Value Range Status Comment   Specimen Description BLOOD RIGHT HAND   Final    Special Requests     Final    Value: BOTTLES DRAWN AEROBIC AND ANAEROBIC 5CC AEROBIC/4CC ANAEROBIC   Culture  Setup Time [id number]   Final    Culture     Final    Value:        BLOOD CULTURE RECEIVED NO GROWTH TO DATE CULTURE  WILL BE HELD FOR 5 DAYS BEFORE ISSUING A FINAL NEGATIVE REPORT   Report Status PENDING   Incomplete   CULTURE, ROUTINE-ABSCESS     Status: Normal (Preliminary result)   Collection Time   10/29/11  3:19 PM      Component Value Range Status Comment   Specimen Description ABSCESS   Final    Special Requests NONE   Final    Gram Stain     Final    Value: FEW WBC PRESENT,BOTH PMN AND MONONUCLEAR     NO SQUAMOUS EPITHELIAL CELLS SEEN     NO ORGANISMS SEEN   Culture NO GROWTH 2 DAYS   Final    Report Status PENDING   Incomplete     Studies/Results: No results found.  Medications: I have reviewed the patient's current medications. Scheduled Meds:    . enoxaparin  40 mg Subcutaneous Q24H  . feeding supplement  1 Container Oral TID WC  . levofloxacin (LEVAQUIN) IV  750 mg Intravenous  Q24H  . piperacillin-tazobactam (ZOSYN)  IV  3.375 g Intravenous Q8H  . DISCONTD: vancomycin  1,000 mg Intravenous Q8H   Continuous Infusions:    . 0.9 % NaCl with KCl 20 mEq / L 75 mL/hr at 10/31/11 2032   PRN Meds:.albuterol, benzonatate, HYDROcodone-acetaminophen, HYDROcodone-homatropine, ipratropium, LORazepam, morphine, ondansetron (ZOFRAN) IV  Assessment/Plan: Aspiration pneumonia  Secondary to achalasia induced dysphagia. The patient failed outpatient therapy with Augmentin. She is on empiric Levaquin, Zosyn and Vancomycin.  Antibiotics since 10/28/2011.  Continue levofloxacin and Zosyn.  Defined course of antibiotics to 11/04/2011 to complete 8 day course.  Achalasia  Plan for surgery on 11/07/2011 with Dr. Daphine Deutscher. Dr. Ewing Schlein will need 48 hour notice before the planned surgery to get EGD done to "clean out" esophagus the day before the surgery.   Leukocytosis  Secondary to ongoing infection.  Resolved on antibiotics.   Thrombocytosis  Likely reactive.   Pleural effusion, left  Therapeutic thoracentesis unsuccessful 10/29/11, may be loculated.  Patient's breathing stable without respiratory distress.  Dr. Daphine Deutscher considering placing a chest tube prior to surgery.    Microcytic anemia  Low iron but high ferritin suggestive of anemia of chronic disease.  Hemoglobin stable. Likely menstrual losses contributing.   Elevated D-Dimer  CT shows no evidence of acute pulmonary embolism.  Moderately large left pleural effusion with compressive atelectasis of the left lower lobe.  Prophylaxis Lovenox  Disposition Pending   LOS: 5 days  Cartez Mogle A, MD 11/01/2011, 4:20 PM

## 2011-11-01 NOTE — Progress Notes (Signed)
Patient ID: [name], female   DOB: April 14, 1971, 41 y.o.   MRN: [id number] [facility] ([facility] Surgery Progress Note:   * No surgery found *  Subjective: Mental status is clear Objective: Vital signs in last 24 hours: Temp:  [98.1 F (36.7 C)-98.5 F (36.9 C)] 98.3 F (36.8 C) (02/21 0502) Pulse Rate:  [81-100] 81  (02/21 0502) Resp:  [16-20] 20  (02/21 0502) BP: (126-130)/(82-88) 130/84 mmHg (02/21 0502) SpO2:  [94 %-99 %] 94 % (02/21 0502)  Intake/Output from previous day: 02/20 0701 - 02/21 0700 In: 2183.8 [P.O.:1125; I.V.:958.8; IV Piggyback:100] Out: 2525 [Urine:2525] Intake/Output this shift:    Physical Exam: Work of breathing is  Not labored  Lab Results:  Results for orders placed during the hospital encounter of 10/27/11 (from the past 48 hour(s))  BASIC METABOLIC PANEL     Status: Abnormal   Collection Time   10/31/11  5:19 AM      Component Value Range Comment   Sodium 143  135 - 145 (mEq/L)    Potassium 3.9  3.5 - 5.1 (mEq/L)    Chloride 108  96 - 112 (mEq/L)    CO2 25  19 - 32 (mEq/L)    Glucose, Bld 96  70 - 99 (mg/dL)    BUN 5 (*) 6 - 23 (mg/dL)    Creatinine, Ser 5.36  0.50 - 1.10 (mg/dL)    Calcium 9.0  8.4 - 10.5 (mg/dL)    GFR calc non Af Amer 68 (*) >90 (mL/min)    GFR calc Af Amer 79 (*) >90 (mL/min)   CBC     Status: Abnormal   Collection Time   10/31/11  5:19 AM      Component Value Range Comment   WBC 9.8  4.0 - 10.5 (K/uL)    RBC 4.01  3.87 - 5.11 (MIL/uL)    Hemoglobin 8.1 (*) 12.0 - 15.0 (g/dL)    HCT 64.4 (*) 03.4 - 46.0 (%)    MCV 62.6 (*) 78.0 - 100.0 (fL)    MCH 20.2 (*) 26.0 - 34.0 (pg)    MCHC 32.3  30.0 - 36.0 (g/dL)    RDW 74.2  59.5 - 63.8 (%)    Platelets 454 (*) 150 - 400 (K/uL)   CREATININE, SERUM     Status: Abnormal   Collection Time   11/01/11  3:20 AM      Component Value Range Comment   Creatinine, Ser 0.96  0.50 - 1.10 (mg/dL)    GFR calc non Af Amer 72 (*) >90 (mL/min)    GFR calc Af Amer 84 (*) >90 (mL/min)      Radiology/Results: Ct Angio Chest W/cm &/or Wo Cm  10/30/2011  *RADIOLOGY REPORT*  Clinical Data: Shortness of breath, mid chest pressure, high D- dimer  CT ANGIOGRAPHY CHEST  Technique:  Multidetector CT imaging of the chest using the standard protocol during bolus administration of intravenous contrast. Multiplanar reconstructed images including MIPs were obtained and reviewed to evaluate the vascular anatomy.  Contrast: OMNIPAQUE IOHEXOL 300 MG/ML IV SOLN  Comparison: Portable chest x-ray of 10/29/2011 and CT chest of 10/18/2011  Findings: The pulmonary arteries opacify well and there is no evidence of acute pulmonary embolism.  Some respiratory motion does obscure detail in the lung bases.  The thoracic aorta opacifies with no acute abnormality.  There are mediastinal nodes present which are not pathologically enlarged.  There is cardiomegaly present, and there does appear to be a small pericardial  effusion present.  There is a moderately large left pleural effusion present with atelectasis of much of the left lower lobe.  No central endobronchial lesion is seen.  This atelectasis may be compressive as a result of the left pleural effusion. The esophagus is again noted to be diffusely dilated with food particles, in this patient with history of achalasia.  This appearance is unchanged compared to prior CTs.  On the lung window images compressive atelectasis of the left lower lobe is noted.  No lung nodule or infiltrate is seen.  The moderately large left pleural effusion again is noted.  No bony abnormality is seen.  IMPRESSION:  1.  No evidence of acute pulmonary embolism. 2.  Moderately large left pleural effusion with probable compressive atelectasis of the left lower lobe.  No central endobronchial lesion is seen. 3.  Cardiomegaly and small pericardial effusion. 4.  Changes of achalasia again are noted.  Original Report Authenticated By: Juline Patch, M.D.     Anti-infectives: Anti-infectives     Start     Dose/Rate Route Frequency Ordered Stop   10/28/11 0800   vancomycin (VANCOCIN) IVPB 1000 mg/200 mL premix  Status:  Discontinued        1,000 mg 200 mL/hr over 60 Minutes Intravenous Every 8 hours 10/28/11 0604 10/31/11 1634   10/28/11 0800  piperacillin-tazobactam (ZOSYN) IVPB 3.375 g       3.375 g 12.5 mL/hr over 240 Minutes Intravenous Every 8 hours 10/28/11 0604     10/28/11 0700   Levofloxacin (LEVAQUIN) IVPB 750 mg        750 mg 100 mL/hr over 90 Minutes Intravenous Every 24 hours 10/28/11 0603            Assessment/Plan: Problem List: Patient Active Problem List  Diagnoses  . Achalasia  . Leukocytosis  . Thrombocytosis  . Pericardial effusion  . Pleural effusion, left  . Microcytic anemia  . Tachycardia  . SIRS (systemic inflammatory response syndrome)  . Aspiration pneumonia  . D-dimer, elevated    Large left pleural effusion.  Will need to be tapped before surgery next week.   * No surgery found *    LOS: 5 days   Matt B. Daphine Deutscher, MD, [facility] [phone] beeper [phone]  11/01/2011 8:37 AM

## 2011-11-01 NOTE — Progress Notes (Signed)
Report from [place], [place]. Pt seen w/ mother at bedside. Pt tearful over extended length of hospitalization. Supportive listening offered, POC reviewed w/ her based on MD's notes. Reviewed present meds w/ pt at her request. PRN dose of Ativan given at pt request. Pt verbalizes desire for shampoo. Shampoo cap given and pt very appreciative. Denies pain/nausea at present. Will monitor.

## 2011-11-02 ENCOUNTER — Inpatient Hospital Stay (HOSPITAL_COMMUNITY): Payer: PRIVATE HEALTH INSURANCE

## 2011-11-02 ENCOUNTER — Encounter (HOSPITAL_COMMUNITY): Payer: Self-pay | Admitting: Pharmacy Technician

## 2011-11-02 LAB — BASIC METABOLIC PANEL WITH GFR
BUN: 5 mg/dL — ABNORMAL LOW (ref 6–23)
CO2: 25 meq/L (ref 19–32)
Calcium: 9.2 mg/dL (ref 8.4–10.5)
Chloride: 105 meq/L (ref 96–112)
Creatinine, Ser: 1.09 mg/dL (ref 0.50–1.10)
GFR calc Af Amer: 72 mL/min — ABNORMAL LOW
GFR calc non Af Amer: 62 mL/min — ABNORMAL LOW
Glucose, Bld: 91 mg/dL (ref 70–99)
Potassium: 3.7 meq/L (ref 3.5–5.1)
Sodium: 140 meq/L (ref 135–145)

## 2011-11-02 LAB — CBC
HCT: 26 % — ABNORMAL LOW (ref 36.0–46.0)
Hemoglobin: 8.2 g/dL — ABNORMAL LOW (ref 12.0–15.0)
MCH: 19.9 pg — ABNORMAL LOW (ref 26.0–34.0)
MCHC: 31.5 g/dL (ref 30.0–36.0)
MCV: 63 fL — ABNORMAL LOW (ref 78.0–100.0)
Platelets: 470 10*3/uL — ABNORMAL HIGH (ref 150–400)
RBC: 4.13 MIL/uL (ref 3.87–5.11)
RDW: 15.5 % (ref 11.5–15.5)
WBC: 8.8 10*3/uL (ref 4.0–10.5)

## 2011-11-02 LAB — CULTURE, ROUTINE-ABSCESS: Culture: NO GROWTH

## 2011-11-02 MED ORDER — MORPHINE SULFATE 2 MG/ML IJ SOLN
1.0000 mg | Freq: Once | INTRAMUSCULAR | Status: AC | PRN
Start: 2011-11-02 — End: 2011-11-02
  Administered 2011-11-02: 2 mg via INTRAVENOUS
  Filled 2011-11-02: qty 1

## 2011-11-02 MED ORDER — MORPHINE SULFATE 2 MG/ML IJ SOLN: 2.0000 mg | Freq: Once | INTRAMUSCULAR | Status: DC | PRN

## 2011-11-02 MED ORDER — LORAZEPAM 2 MG/ML IJ SOLN
0.5000 mg | Freq: Three times a day (TID) | INTRAMUSCULAR | Status: DC | PRN
  Administered 2011-11-02 – 2011-11-03 (×2): 0.5 mg via INTRAVENOUS
  Filled 2011-11-02 (×2): qty 1

## 2011-11-02 NOTE — Progress Notes (Signed)
Patient ID: [name], female   DOB: 1970/10/14, 41 y.o.   MRN: [id number] [facility] Surgery Progress Note:   * No surgery found *  Subjective: Mental status is clear Objective: Vital signs in last 24 hours: Temp:  [98.5 F (36.9 C)-99 F (37.2 C)] 99 F (37.2 C) (02/22 0552) Pulse Rate:  [76-79] 79  (02/22 0552) Resp:  [18-20] 20  (02/22 0552) BP: (117-138)/(81-89) 117/81 mmHg (02/22 0552) SpO2:  [94 %-98 %] 96 % (02/22 0552)  Intake/Output from previous day: 02/21 0701 - 02/22 0700 In: 2210 [P.O.:600; I.V.:1560; IV Piggyback:50] Out: 2202 [Urine:2200; Emesis/NG output:1; Stool:1] Intake/Output this shift:    Physical Exam: Work of breathing is  unchanged  Lab Results:  Results for orders placed during the hospital encounter of 10/27/11 (from the past 48 hour(s))  CREATININE, SERUM     Status: Abnormal   Collection Time   11/01/11  3:20 AM      Component Value Range Comment   Creatinine, Ser 0.96  0.50 - 1.10 (mg/dL)    GFR calc non Af Amer 72 (*) >90 (mL/min)    GFR calc Af Amer 84 (*) >90 (mL/min)   CBC     Status: Abnormal   Collection Time   11/02/11  3:35 AM      Component Value Range Comment   WBC 8.8  4.0 - 10.5 (K/uL)    RBC 4.13  3.87 - 5.11 (MIL/uL)    Hemoglobin 8.2 (*) 12.0 - 15.0 (g/dL)    HCT 84.6 (*) 96.2 - 46.0 (%)    MCV 63.0 (*) 78.0 - 100.0 (fL)    MCH 19.9 (*) 26.0 - 34.0 (pg)    MCHC 31.5  30.0 - 36.0 (g/dL)    RDW 95.2  84.1 - 32.4 (%)    Platelets 470 (*) 150 - 400 (K/uL)   BASIC METABOLIC PANEL     Status: Abnormal   Collection Time   11/02/11  3:35 AM      Component Value Range Comment   Sodium 140  135 - 145 (mEq/L)    Potassium 3.7  3.5 - 5.1 (mEq/L)    Chloride 105  96 - 112 (mEq/L)    CO2 25  19 - 32 (mEq/L)    Glucose, Bld 91  70 - 99 (mg/dL)    BUN 5 (*) 6 - 23 (mg/dL)    Creatinine, Ser 4.01  0.50 - 1.10 (mg/dL)    Calcium 9.2  8.4 - 10.5 (mg/dL)    GFR calc non Af Amer 62 (*) >90 (mL/min)    GFR calc Af Amer 72 (*) >90  (mL/min)     Radiology/Results: No results found.  Anti-infectives: Anti-infectives     Start     Dose/Rate Route Frequency Ordered Stop   10/28/11 0800   vancomycin (VANCOCIN) IVPB 1000 mg/200 mL premix  Status:  Discontinued        1,000 mg 200 mL/hr over 60 Minutes Intravenous Every 8 hours 10/28/11 0604 10/31/11 1634   10/28/11 0800  piperacillin-tazobactam (ZOSYN) IVPB 3.375 g       3.375 g 12.5 mL/hr over 240 Minutes Intravenous Every 8 hours 10/28/11 0604     10/28/11 0700   Levofloxacin (LEVAQUIN) IVPB 750 mg        750 mg 100 mL/hr over 90 Minutes Intravenous Every 24 hours 10/28/11 0603            Assessment/Plan: Problem List: Patient Active Problem List  Diagnoses  .  Achalasia  . Leukocytosis  . Thrombocytosis  . Pericardial effusion  . Pleural effusion, left  . Microcytic anemia  . Tachycardia  . SIRS (systemic inflammatory response syndrome)  . Aspiration pneumonia  . D-dimer, elevated    Large left pleural effusion needs to be tapped.  Have requested per IR.   * No surgery found *    LOS: 6 days   Matt B. Daphine Deutscher, MD, [facility] [phone] beeper [phone]  11/02/2011 8:31 AM

## 2011-11-02 NOTE — Progress Notes (Signed)
Subjective: Continues to feel nauseated.  No other specific complaints.  Objective: Vital signs in last 24 hours: Filed Vitals:   11/01/11 2113 11/02/11 0552 11/02/11 1306 11/02/11 1352  BP: 130/82 117/81 125/75 121/83  Pulse: 76 79    Temp: 98.8 F (37.1 C) 99 F (37.2 C)    TempSrc: Oral Oral    Resp: 20 20    Height:      Weight:      SpO2: 94% 96%     Weight change:   Intake/Output Summary (Last 24 hours) at 11/02/11 1353 Last data filed at 11/02/11 1100  Gross per 24 hour  Intake   1970 ml  Output    801 ml  Net   1169 ml    Physical Exam: General: Awake, Oriented, No acute distress. HEENT: EOMI. Neck: Supple CV: S1 and S2 Lungs: Crackles bilaterally, decreased breath sounds at left lung base. Abdomen: Soft, Nontender, Nondistended, +bowel sounds. Ext: Good pulses. Trace edema.  Lab Results:  Basename 11/02/11 0335 11/01/11 0320 10/31/11 0519  NA 140 -- 143  K 3.7 -- 3.9  CL 105 -- 108  CO2 25 -- 25  GLUCOSE 91 -- 96  BUN 5* -- 5*  CREATININE 1.09 0.96 --  CALCIUM 9.2 -- 9.0  MG -- -- --  PHOS -- -- --   No results found for this basename: AST:2,ALT:2,ALKPHOS:2,BILITOT:2,PROT:2,ALBUMIN:2 in the last 72 hours No results found for this basename: LIPASE:2,AMYLASE:2 in the last 72 hours  Basename 11/02/11 0335 10/31/11 0519  WBC 8.8 9.8  NEUTROABS -- --  HGB 8.2* 8.1*  HCT 26.0* 25.1*  MCV 63.0* 62.6*  PLT 470* 454*   No results found for this basename: CKTOTAL:3,CKMB:3,CKMBINDEX:3,TROPONINI:3 in the last 72 hours No components found with this basename: POCBNP:3 No results found for this basename: DDIMER:2 in the last 72 hours No results found for this basename: HGBA1C:2 in the last 72 hours No results found for this basename: CHOL:2,HDL:2,LDLCALC:2,TRIG:2,CHOLHDL:2,LDLDIRECT:2 in the last 72 hours No results found for this basename: TSH,T4TOTAL,FREET3,T3FREE,THYROIDAB in the last 72 hours No results found for this basename:  VITAMINB12:2,FOLATE:2,FERRITIN:2,TIBC:2,IRON:2,RETICCTPCT:2 in the last 72 hours  Micro Results: Recent Results (from the past 240 hour(s))  CULTURE, BLOOD (ROUTINE X 2)     Status: Normal (Preliminary result)   Collection Time   10/28/11  1:15 AM      Component Value Range Status Comment   Specimen Description BLOOD RIGHT ANTECUBITAL   Final    Special Requests BOTTLES DRAWN AEROBIC AND ANAEROBIC 5CC   Final    Culture  Setup Time [id number]   Final    Culture     Final    Value:        BLOOD CULTURE RECEIVED NO GROWTH TO DATE CULTURE WILL BE HELD FOR 5 DAYS BEFORE ISSUING Chambers FINAL NEGATIVE REPORT   Report Status PENDING   Incomplete   CULTURE, BLOOD (ROUTINE X 2)     Status: Normal (Preliminary result)   Collection Time   10/28/11  1:25 AM      Component Value Range Status Comment   Specimen Description BLOOD RIGHT HAND   Final    Special Requests     Final    Value: BOTTLES DRAWN AEROBIC AND ANAEROBIC 5CC AEROBIC/4CC ANAEROBIC   Culture  Setup Time [id number]   Final    Culture     Final    Value:        BLOOD CULTURE RECEIVED NO GROWTH TO DATE CULTURE WILL  BE HELD FOR 5 DAYS BEFORE ISSUING Chambers FINAL NEGATIVE REPORT   Report Status PENDING   Incomplete   CULTURE, ROUTINE-ABSCESS     Status: Normal   Collection Time   10/29/11  3:19 PM      Component Value Range Status Comment   Specimen Description ABSCESS   Final    Special Requests NONE   Final    Gram Stain     Final    Value: FEW WBC PRESENT,BOTH PMN AND MONONUCLEAR     NO SQUAMOUS EPITHELIAL CELLS SEEN     NO ORGANISMS SEEN   Culture NO GROWTH 3 DAYS   Final    Report Status 11/02/2011 FINAL   Final     Studies/Results: No results found.  Medications: I have reviewed the patient's current medications. Scheduled Meds:    . enoxaparin  40 mg Subcutaneous Q24H  . feeding supplement  1 Container Oral TID WC  . levofloxacin (LEVAQUIN) IV  750 mg Intravenous Q24H  . piperacillin-tazobactam (ZOSYN)  IV  3.375 g  Intravenous Q8H   Continuous Infusions:    . 0.9 % NaCl with KCl 20 mEq / L 50 mL/hr at 11/01/11 2200   PRN Meds:.albuterol, benzonatate, HYDROcodone-acetaminophen, HYDROcodone-homatropine, ipratropium, LORazepam, morphine injection, morphine, ondansetron (ZOFRAN) IV, DISCONTD: LORazepam, DISCONTD:  morphine injection  Assessment/Plan: Aspiration pneumonia  Secondary to achalasia induced dysphagia. The patient failed outpatient therapy with Augmentin. She is on empiric Levaquin, and Zosyn. Vancomycin discontinued.  Discontinue Zosyn.  Antibiotics since 10/28/2011.  Continue levofloxacin.  Defined course of antibiotics to 11/04/2011 to complete 8 day course.  Achalasia  Plan for surgery on 11/07/2011 with Dr. Daphine Deutscher. Dr. Ewing Schlein will need 48 hour notice before the planned surgery to get EGD done to "clean out" esophagus the day before the surgery.   Leukocytosis  Secondary to ongoing infection.  Resolved on antibiotics.   Thrombocytosis  Likely reactive.   Pleural effusion, left  Therapeutic thoracentesis unsuccessful 10/29/11.  Discussed with Dr. Daphine Deutscher, will attempt another therapeutic and diagnostic thoracentesis today. Patient's breathing stable without respiratory distress.  Microcytic anemia  Low iron but high ferritin suggestive of anemia of chronic disease.  Hemoglobin stable. Likely menstrual losses contributing.   Elevated D-Dimer  CT shows no evidence of acute pulmonary embolism.  Moderately large left pleural effusion with compressive atelectasis of the left lower lobe.  Prophylaxis Lovenox  Disposition Pending   LOS: 6 days  Rebecca Deliz A, MD 11/02/2011, 1:53 PM

## 2011-11-03 LAB — CULTURE, BLOOD (ROUTINE X 2)
Culture  Setup Time: 201302171111
Culture  Setup Time: 201302171111
Culture: NO GROWTH
Culture: NO GROWTH

## 2011-11-03 NOTE — Progress Notes (Signed)
Subjective: Patient thoracentesis yesterday and had 700 cc removed.  Breathing better.  Objective: Vital signs in last 24 hours: Filed Vitals:   11/02/11 1433 11/02/11 2110 11/03/11 0610 11/03/11 1300  BP: 124/87 128/85 119/81 117/73  Pulse: 91 74 69 86  Temp: 98.3 F (36.8 C) 98.5 F (36.9 C) 98.6 F (37 C) 98.3 F (36.8 C)  TempSrc: Oral Oral Oral   Resp: 18 16 16 20   Height:      Weight:      SpO2: 98% 95% 96% 100%   Weight change:   Intake/Output Summary (Last 24 hours) at 11/03/11 1521 Last data filed at 11/03/11 1300  Gross per 24 hour  Intake    480 ml  Output      0 ml  Net    480 ml    Physical Exam: General: Awake, Oriented, No acute distress. HEENT: EOMI. Neck: Supple CV: S1 and S2 Lungs: Crackles bilaterally, decreased breath sounds at left lung base, improved from yesterday. Abdomen: Soft, Nontender, Nondistended, +bowel sounds. Ext: Good pulses. Trace edema.  Lab Results:  Basename 11/02/11 0335 11/01/11 0320  NA 140 --  K 3.7 --  CL 105 --  CO2 25 --  GLUCOSE 91 --  BUN 5* --  CREATININE 1.09 0.96  CALCIUM 9.2 --  MG -- --  PHOS -- --   No results found for this basename: AST:2,ALT:2,ALKPHOS:2,BILITOT:2,PROT:2,ALBUMIN:2 in the last 72 hours No results found for this basename: LIPASE:2,AMYLASE:2 in the last 72 hours  Basename 11/02/11 0335  WBC 8.8  NEUTROABS --  HGB 8.2*  HCT 26.0*  MCV 63.0*  PLT 470*   No results found for this basename: CKTOTAL:3,CKMB:3,CKMBINDEX:3,TROPONINI:3 in the last 72 hours No components found with this basename: POCBNP:3 No results found for this basename: DDIMER:2 in the last 72 hours No results found for this basename: HGBA1C:2 in the last 72 hours No results found for this basename: CHOL:2,HDL:2,LDLCALC:2,TRIG:2,CHOLHDL:2,LDLDIRECT:2 in the last 72 hours No results found for this basename: TSH,T4TOTAL,FREET3,T3FREE,THYROIDAB in the last 72 hours No results found for this basename:  VITAMINB12:2,FOLATE:2,FERRITIN:2,TIBC:2,IRON:2,RETICCTPCT:2 in the last 72 hours  Micro Results: Recent Results (from the past 240 hour(s))  CULTURE, BLOOD (ROUTINE X 2)     Status: Normal   Collection Time   10/28/11  1:15 AM      Component Value Range Status Comment   Specimen Description BLOOD RIGHT ANTECUBITAL   Final    Special Requests BOTTLES DRAWN AEROBIC AND ANAEROBIC 5CC   Final    Culture  Setup Time [id number]   Final    Culture NO GROWTH 5 DAYS   Final    Report Status 11/03/2011 FINAL   Final   CULTURE, BLOOD (ROUTINE X 2)     Status: Normal   Collection Time   10/28/11  1:25 AM      Component Value Range Status Comment   Specimen Description BLOOD RIGHT HAND   Final    Special Requests     Final    Value: BOTTLES DRAWN AEROBIC AND ANAEROBIC 5CC AEROBIC/4CC ANAEROBIC   Culture  Setup Time [id number]   Final    Culture NO GROWTH 5 DAYS   Final    Report Status 11/03/2011 FINAL   Final   CULTURE, ROUTINE-ABSCESS     Status: Normal   Collection Time   10/29/11  3:19 PM      Component Value Range Status Comment   Specimen Description ABSCESS   Final    Special Requests NONE  Final    Gram Stain     Final    Value: FEW WBC PRESENT,BOTH PMN AND MONONUCLEAR     NO SQUAMOUS EPITHELIAL CELLS SEEN     NO ORGANISMS SEEN   Culture NO GROWTH 3 DAYS   Final    Report Status 11/02/2011 FINAL   Final   BODY FLUID CULTURE     Status: Normal (Preliminary result)   Collection Time   11/02/11  2:04 PM      Component Value Range Status Comment   Specimen Description PLEURAL   Final    Special Requests Normal   Final    Gram Stain     Final    Value: NO WBC SEEN     NO SQUAMOUS EPITHELIAL CELLS SEEN     NO ORGANISMS SEEN   Culture NO GROWTH   Final    Report Status PENDING   Incomplete     Studies/Results: Dg Chest 1 View  11/02/2011  *RADIOLOGY REPORT*  Clinical Data: Status post left thoracentesis  CHEST - 1 VIEW  Comparison: Portable chest x-ray of 10/29/2011  Findings:  The volume of the left pleural effusion has diminished considerably after left thoracentesis.  No pneumothorax is seen. The right lung is clear other than mild basilar atelectasis. Cardiomegaly is stable.  IMPRESSION: Significant reduction in volume of the left pleural effusion after left thoracentesis.  No pneumothorax.  Original Report Authenticated By: Juline Patch, M.D.   US Thoracentesis  11/02/2011  *RADIOLOGY REPORT*  Clinical Data:  History of achalasia with left pleural effusion. Request been made for therapeutic and diagnostic left-sided thoracentesis.  ULTRASOUND GUIDED left THORACENTESIS  Comparison:  Prior ultrasound of the chest and prior diagnostic thoracentesis.  An ultrasound guided thoracentesis was thoroughly discussed with the patient and questions answered.  The benefits, risks, alternatives and complications were also discussed.  The patient understands and wishes to proceed with the procedure.  Written consent was obtained.  Ultrasound was performed to localize and mark an adequate pocket of fluid in the left chest.  The area was then prepped and draped in the normal sterile fashion.  1% Lidocaine was used for local anesthesia.  Under ultrasound guidance a 19 gauge Yueh catheter was introduced.  Thoracentesis was performed.  The catheter was removed and a dressing applied.  Complications:  None immediate  Findings: A total of approximately 700 ml of cloudy yellow serous fluid was removed. A fluid sample was sent for laboratory analysis.  IMPRESSION: Successful ultrasound guided left thoracentesis yielding 700 ml of pleural fluid.  Read by: Anselm Pancoast, P.A.-C  Original Report Authenticated By: Richarda Overlie, M.D.    Medications: I have reviewed the patient's current medications. Scheduled Meds:    . enoxaparin  40 mg Subcutaneous Q24H  . feeding supplement  1 Container Oral TID WC  . levofloxacin (LEVAQUIN) IV  750 mg Intravenous Q24H   Continuous Infusions:    . 0.9 % NaCl  with KCl 20 mEq / L 20 mL/hr at 11/03/11 0922   PRN Meds:.albuterol, benzonatate, HYDROcodone-acetaminophen, HYDROcodone-homatropine, ipratropium, LORazepam, morphine, ondansetron (ZOFRAN) IV  Assessment/Plan: Aspiration pneumonia  Secondary to achalasia induced dysphagia. The patient failed outpatient therapy with Augmentin. She is on empiric Levaquin. Vancomycin and Zosyn discontinued.  Antibiotics since 10/28/2011.  Continue levofloxacin.  Defined course of antibiotics till 11/04/2011 to complete 8 day course.  Achalasia  Plan for surgery on 11/07/2011 with Dr. Daphine Deutscher. Dr. Ewing Schlein will need 48 hour notice before the planned surgery  to get EGD done to "clean out" esophagus the day before the surgery.   Leukocytosis  Secondary to ongoing infection.  Resolved on antibiotics.   Thrombocytosis  Likely reactive.   Pleural effusion, left  Therapeutic thoracentesis unsuccessful 10/29/11.  Attempted and another thoracentesis on February 22 of 2013 and had 700 cc removed, chest x-ray after thoracentesis shows no pneumothorax on 11/02/2011.  The patient to have repeat chest x-ray on 11/05/2011.  Microcytic anemia  Low iron but high ferritin suggestive of anemia of chronic disease.  Hemoglobin stable. Likely menstrual losses contributing.   Elevated D-Dimer  CT shows no evidence of acute pulmonary embolism.  Moderately large left pleural effusion with compressive atelectasis of the left lower lobe.  Prophylaxis Lovenox  Disposition Pending   LOS: 7 days  Lukah Goswami A, MD 11/03/2011, 3:21 PM

## 2011-11-03 NOTE — Progress Notes (Signed)
General Surgery Note  LOS: 7 days   Assessment/Plan: 1.  Achalasia  Plan for surgery by Dr. Daphine Deutscher on 11/07/2011.  To stay in hospital until surgery date.  Patient had prior esophageal myotomy through the left chest >20 years ago.  GI - Dr. Roderic Scarce  Will repeat CXR on Monday 2.  Left pleural effusion  Tapped approx 700 cc yesterday.  3.  Aspiration pneumonia  s/p    4.  Leukocytosis  5. Thrombocytosis     6. Microcytic anemia (10/18/2011) 7.  D-dimer, elevated (10/30/2011)  Subjective:  Feels better after fluid tapped.  No specific pain.  Objective:   Filed Vitals:   11/03/11 0610  BP: 119/81  Pulse: 69  Temp: 98.6 F (37 C)  Resp: 16     Intake/Output from previous day:  02/22 0701 - 02/23 0700 In: 480 [P.O.:480] Out: -   Intake/Output this shift:      Physical Exam:   General: WN AA F who is alert and oriented.    HEENT: Normal. Pupils equal. Good dentition. .   Lungs: Slight decrease breath sounds on left   Abdomen: soft   Neurologic:  Grossly intact to motor and sensory function.   Psychiatric: Has normal mood and affect. Behavior is normal   Lab Results:    Basename 11/02/11 0335  WBC 8.8  HGB 8.2*  HCT 26.0*  PLT 470*    BMET   Basename 11/02/11 0335 11/01/11 0320  NA 140 --  K 3.7 --  CL 105 --  CO2 25 --  GLUCOSE 91 --  BUN 5* --  CREATININE 1.09 0.96  CALCIUM 9.2 --    PT/INR  No results found for this basename: LABPROT:2,INR:2 in the last 72 hours  ABG  No results found for this basename: PHART:2,PCO2:2,PO2:2,HCO3:2 in the last 72 hours   Studies/Results:  Dg Chest 1 View  11/02/2011  *RADIOLOGY REPORT*  Clinical Data: Status post left thoracentesis  CHEST - 1 VIEW  Comparison: Portable chest x-ray of 10/29/2011  Findings: The volume of the left pleural effusion has diminished considerably after left thoracentesis.  No pneumothorax is seen. The right lung is clear other than mild basilar atelectasis. Cardiomegaly is stable.   IMPRESSION: Significant reduction in volume of the left pleural effusion after left thoracentesis.  No pneumothorax.  Original Report Authenticated By: Juline Patch, M.D.   US Thoracentesis  11/02/2011  *RADIOLOGY REPORT*  Clinical Data:  History of achalasia with left pleural effusion. Request been made for therapeutic and diagnostic left-sided thoracentesis.  ULTRASOUND GUIDED left THORACENTESIS  Comparison:  Prior ultrasound of the chest and prior diagnostic thoracentesis.  An ultrasound guided thoracentesis was thoroughly discussed with the patient and questions answered.  The benefits, risks, alternatives and complications were also discussed.  The patient understands and wishes to proceed with the procedure.  Written consent was obtained.  Ultrasound was performed to localize and mark an adequate pocket of fluid in the left chest.  The area was then prepped and draped in the normal sterile fashion.  1% Lidocaine was used for local anesthesia.  Under ultrasound guidance a 19 gauge Yueh catheter was introduced.  Thoracentesis was performed.  The catheter was removed and a dressing applied.  Complications:  None immediate  Findings: A total of approximately 700 ml of cloudy yellow serous fluid was removed. A fluid sample was sent for laboratory analysis.  IMPRESSION: Successful ultrasound guided left thoracentesis yielding 700 ml of pleural fluid.  Read by:  Anselm Pancoast, P.A.-C  Original Report Authenticated By: Richarda Overlie, M.D.     Anti-infectives:   Anti-infectives     Start     Dose/Rate Route Frequency Ordered Stop   10/28/11 0800   vancomycin (VANCOCIN) IVPB 1000 mg/200 mL premix  Status:  Discontinued        1,000 mg 200 mL/hr over 60 Minutes Intravenous Every 8 hours 10/28/11 0604 10/31/11 1634   10/28/11 0800   piperacillin-tazobactam (ZOSYN) IVPB 3.375 g  Status:  Discontinued        3.375 g 12.5 mL/hr over 240 Minutes Intravenous Every 8 hours 10/28/11 0604 11/02/11 1355    10/28/11 0700   Levofloxacin (LEVAQUIN) IVPB 750 mg        750 mg 100 mL/hr over 90 Minutes Intravenous Every 24 hours 10/28/11 0603            Ovidio Kin, MD, FACS Pager: [phone],   [facility] Office: [phone] 11/03/2011

## 2011-11-04 LAB — CBC
HCT: 26.9 % — ABNORMAL LOW (ref 36.0–46.0)
Hemoglobin: 8.7 g/dL — ABNORMAL LOW (ref 12.0–15.0)
MCH: 20.3 pg — ABNORMAL LOW (ref 26.0–34.0)
MCHC: 32.3 g/dL (ref 30.0–36.0)
MCV: 62.9 fL — ABNORMAL LOW (ref 78.0–100.0)
Platelets: 460 10*3/uL — ABNORMAL HIGH (ref 150–400)
RBC: 4.28 MIL/uL (ref 3.87–5.11)
RDW: 16.1 % — ABNORMAL HIGH (ref 11.5–15.5)
WBC: 7.9 10*3/uL (ref 4.0–10.5)

## 2011-11-04 LAB — BASIC METABOLIC PANEL
BUN: 6 mg/dL (ref 6–23)
CO2: 25 mEq/L (ref 19–32)
Calcium: 9.7 mg/dL (ref 8.4–10.5)
Chloride: 106 mEq/L (ref 96–112)
Creatinine, Ser: 0.91 mg/dL (ref 0.50–1.10)
GFR calc Af Amer: 90 mL/min — ABNORMAL LOW (ref >90)
GFR calc non Af Amer: 77 mL/min — ABNORMAL LOW (ref >90)
Glucose, Bld: 97 mg/dL (ref 70–99)
Potassium: 3.6 mEq/L (ref 3.5–5.1)
Sodium: 141 mEq/L (ref 135–145)

## 2011-11-04 NOTE — Progress Notes (Signed)
ANTIBIOTIC CONSULT NOTE - F/U  Pharmacy Consult for levaquin Indication: HCAP/Asp PNA No Known Allergies  Patient Measurements: Height: 6' 0.05" (183 cm) Weight: 202 lb 2.6 oz (91.7 kg) IBW/kg (Calculated) : 73.21    Vital Signs:   Intake/Output from previous day: 02/23 0701 - 02/24 0700 In: 793.1 [P.O.:480; I.V.:163.1; IV Piggyback:150] Out: -  Intake/Output from this shift:    Labs:  Basename 11/04/11 0350 11/02/11 0335  WBC 7.9 8.8  HGB 8.7* 8.2*  PLT 460* 470*  LABCREA -- --  CREATININE 0.91 1.09   Estimated Creatinine Clearance: 103.5 ml/min (by C-G formula based on Cr of 0.91).    Microbiology: Recent Results (from the past 720 hour(s))  URINE CULTURE     Status: Normal   Collection Time   10/18/11 10:46 PM      Component Value Range Status Comment   Specimen Description URINE, CLEAN CATCH   Final    Special Requests Normal   Final    Culture  Setup Time [id number]   Final    Colony Count 20,OOO COLONIES/ML   Final    Culture     Final    Value: Multiple bacterial morphotypes present, none predominant. Suggest appropriate recollection if clinically indicated.   Report Status 10/20/2011 FINAL   Final   CULTURE, BLOOD (ROUTINE X 2)     Status: Normal   Collection Time   10/18/11 11:20 PM      Component Value Range Status Comment   Specimen Description BLOOD LEFT HAND   Final    Special Requests BOTTLES DRAWN AEROBIC AND ANAEROBIC [facility]   Final    Culture  Setup Time [id number]   Final    Culture NO GROWTH 5 DAYS   Final    Report Status 10/25/2011 FINAL   Final   CULTURE, BLOOD (ROUTINE X 2)     Status: Normal   Collection Time   10/18/11 11:25 PM      Component Value Range Status Comment   Specimen Description BLOOD RIGHT HAND   Final    Special Requests BOTTLES DRAWN AEROBIC AND ANAEROBIC 2.5CC   Final    Culture  Setup Time [id number]   Final    Culture NO GROWTH 5 DAYS   Final    Report Status 10/25/2011 FINAL   Final   BODY FLUID CULTURE      Status: Normal   Collection Time   10/20/11  9:42 AM      Component Value Range Status Comment   Specimen Description PLEURAL   Final    Special Requests NONE   Final    Gram Stain     Final    Value: WBC PRESENT,BOTH PMN AND MONONUCLEAR     NO ORGANISMS SEEN   Culture NO GROWTH 3 DAYS   Final    Report Status 10/23/2011 FINAL   Final   CULTURE, SPUTUM-ASSESSMENT     Status: Normal   Collection Time   10/21/11  7:18 PM      Component Value Range Status Comment   Specimen Description SPUTUM   Final    Special Requests Normal   Final    Sputum evaluation     Final    Value: THIS SPECIMEN IS ACCEPTABLE. RESPIRATORY CULTURE REPORT TO FOLLOW.   Report Status 10/21/2011 FINAL   Final   CULTURE, RESPIRATORY     Status: Normal   Collection Time   10/21/11  7:18 PM      Component Value Range  Status Comment   Specimen Description SPUTUM   Final    Special Requests NONE   Final    Gram Stain     Final    Value: RARE WBC PRESENT, PREDOMINANTLY PMN     FEW SQUAMOUS EPITHELIAL CELLS PRESENT     RARE GRAM POSITIVE COCCI IN PAIRS AND CHAINS     IN CLUSTERS   Culture NORMAL OROPHARYNGEAL FLORA   Final    Report Status 10/24/2011 FINAL   Final   CULTURE, BLOOD (ROUTINE X 2)     Status: Normal   Collection Time   10/28/11  1:15 AM      Component Value Range Status Comment   Specimen Description BLOOD RIGHT ANTECUBITAL   Final    Special Requests BOTTLES DRAWN AEROBIC AND ANAEROBIC 5CC   Final    Culture  Setup Time [id number]   Final    Culture NO GROWTH 5 DAYS   Final    Report Status 11/03/2011 FINAL   Final   CULTURE, BLOOD (ROUTINE X 2)     Status: Normal   Collection Time   10/28/11  1:25 AM      Component Value Range Status Comment   Specimen Description BLOOD RIGHT HAND   Final    Special Requests     Final    Value: BOTTLES DRAWN AEROBIC AND ANAEROBIC 5CC AEROBIC/4CC ANAEROBIC   Culture  Setup Time [id number]   Final    Culture NO GROWTH 5 DAYS   Final    Report Status  11/03/2011 FINAL   Final   CULTURE, ROUTINE-ABSCESS     Status: Normal   Collection Time   10/29/11  3:19 PM      Component Value Range Status Comment   Specimen Description ABSCESS   Final    Special Requests NONE   Final    Gram Stain     Final    Value: FEW WBC PRESENT,BOTH PMN AND MONONUCLEAR     NO SQUAMOUS EPITHELIAL CELLS SEEN     NO ORGANISMS SEEN   Culture NO GROWTH 3 DAYS   Final    Report Status 11/02/2011 FINAL   Final   BODY FLUID CULTURE     Status: Normal (Preliminary result)   Collection Time   11/02/11  2:04 PM      Component Value Range Status Comment   Specimen Description PLEURAL   Final    Special Requests Normal   Final    Gram Stain     Final    Value: NO WBC SEEN     NO SQUAMOUS EPITHELIAL CELLS SEEN     NO ORGANISMS SEEN   Culture NO GROWTH   Final    Report Status PENDING   Incomplete     Medical History: Past Medical History  Diagnosis Date  . GERD (gastroesophageal reflux disease)   . Heart murmur   . Achalasia     S/p Heller myotomy 1991 and numerous dilations    Medications:  Scheduled:     . enoxaparin  40 mg Subcutaneous Q24H  . feeding supplement  1 Container Oral TID WC  . levofloxacin (LEVAQUIN) IV  750 mg Intravenous Q24H   Infusions:     . 0.9 % NaCl with KCl 20 mEq / L 20 mL/hr at 11/04/11 2130   Assessment:  41 year old female with recent hospitalization for aspiration pneumonia  Vanc 2/17-2/20; Zosyn 2/17-2/22.  Day #8 Levaquin 750 mg IV q24h.  Achalasia, surg  planned 2/27.  Scr wnl, CrCl >100 ml/min  Goal of Therapy:  Appropriate dose of Levaquin  Plan:   Continue Levaquin 750mg  IV q24h  F/U Scr, adjust dose as necessary  LOT per MD  Gwen Her PharmD  [phone] 11/04/2011 2:34 PM

## 2011-11-04 NOTE — Progress Notes (Signed)
Subjective: No specific complaints.  Objective: Vital signs in last 24 hours: Filed Vitals:   11/02/11 2110 11/03/11 0610 11/03/11 1300 11/03/11 2025  BP: 128/85 119/81 117/73 150/89  Pulse: 74 69 86 69  Temp: 98.5 F (36.9 C) 98.6 F (37 C) 98.3 F (36.8 C) 98.8 F (37.1 C)  TempSrc: Oral Oral  Oral  Resp: 16 16 20 18   Height:      Weight:      SpO2: 95% 96% 100% 96%   Weight change:   Intake/Output Summary (Last 24 hours) at 11/04/11 1439 Last data filed at 11/04/11 0603  Gross per 24 hour  Intake  313.1 ml  Output      0 ml  Net  313.1 ml    Physical Exam: General: Awake, Oriented, No acute distress. HEENT: EOMI. Neck: Supple CV: S1 and S2 Lungs: Crackles bilaterally, decreased breath sounds at left lung base, improved from admission. Abdomen: Soft, Nontender, Nondistended, +bowel sounds. Ext: Good pulses. Trace edema.  Lab Results:  Basename 11/04/11 0350 11/02/11 0335  NA 141 140  K 3.6 3.7  CL 106 105  CO2 25 25  GLUCOSE 97 91  BUN 6 5*  CREATININE 0.91 1.09  CALCIUM 9.7 9.2  MG -- --  PHOS -- --   No results found for this basename: AST:2,ALT:2,ALKPHOS:2,BILITOT:2,PROT:2,ALBUMIN:2 in the last 72 hours No results found for this basename: LIPASE:2,AMYLASE:2 in the last 72 hours  Basename 11/04/11 0350 11/02/11 0335  WBC 7.9 8.8  NEUTROABS -- --  HGB 8.7* 8.2*  HCT 26.9* 26.0*  MCV 62.9* 63.0*  PLT 460* 470*   No results found for this basename: CKTOTAL:3,CKMB:3,CKMBINDEX:3,TROPONINI:3 in the last 72 hours No components found with this basename: POCBNP:3 No results found for this basename: DDIMER:2 in the last 72 hours No results found for this basename: HGBA1C:2 in the last 72 hours No results found for this basename: CHOL:2,HDL:2,LDLCALC:2,TRIG:2,CHOLHDL:2,LDLDIRECT:2 in the last 72 hours No results found for this basename: TSH,T4TOTAL,FREET3,T3FREE,THYROIDAB in the last 72 hours No results found for this basename:  VITAMINB12:2,FOLATE:2,FERRITIN:2,TIBC:2,IRON:2,RETICCTPCT:2 in the last 72 hours  Micro Results: Recent Results (from the past 240 hour(s))  CULTURE, BLOOD (ROUTINE X 2)     Status: Normal   Collection Time   10/28/11  1:15 AM      Component Value Range Status Comment   Specimen Description BLOOD RIGHT ANTECUBITAL   Final    Special Requests BOTTLES DRAWN AEROBIC AND ANAEROBIC 5CC   Final    Culture  Setup Time [id number]   Final    Culture NO GROWTH 5 DAYS   Final    Report Status 11/03/2011 FINAL   Final   CULTURE, BLOOD (ROUTINE X 2)     Status: Normal   Collection Time   10/28/11  1:25 AM      Component Value Range Status Comment   Specimen Description BLOOD RIGHT HAND   Final    Special Requests     Final    Value: BOTTLES DRAWN AEROBIC AND ANAEROBIC 5CC AEROBIC/4CC ANAEROBIC   Culture  Setup Time [id number]   Final    Culture NO GROWTH 5 DAYS   Final    Report Status 11/03/2011 FINAL   Final   CULTURE, ROUTINE-ABSCESS     Status: Normal   Collection Time   10/29/11  3:19 PM      Component Value Range Status Comment   Specimen Description ABSCESS   Final    Special Requests NONE   Final  Gram Stain     Final    Value: FEW WBC PRESENT,BOTH PMN AND MONONUCLEAR     NO SQUAMOUS EPITHELIAL CELLS SEEN     NO ORGANISMS SEEN   Culture NO GROWTH 3 DAYS   Final    Report Status 11/02/2011 FINAL   Final   BODY FLUID CULTURE     Status: Normal (Preliminary result)   Collection Time   11/02/11  2:04 PM      Component Value Range Status Comment   Specimen Description PLEURAL   Final    Special Requests Normal   Final    Gram Stain     Final    Value: NO WBC SEEN     NO SQUAMOUS EPITHELIAL CELLS SEEN     NO ORGANISMS SEEN   Culture NO GROWTH   Final    Report Status PENDING   Incomplete     Studies/Results: No results found.  Medications: I have reviewed the patient's current medications. Scheduled Meds:    . enoxaparin  40 mg Subcutaneous Q24H  . feeding supplement   1 Container Oral TID WC  . levofloxacin (LEVAQUIN) IV  750 mg Intravenous Q24H   Continuous Infusions:    . 0.9 % NaCl with KCl 20 mEq / L 20 mL/hr at 11/04/11 0603   PRN Meds:.albuterol, benzonatate, HYDROcodone-acetaminophen, HYDROcodone-homatropine, ipratropium, LORazepam, morphine, ondansetron (ZOFRAN) IV  Assessment/Plan: Aspiration pneumonia  Secondary to achalasia induced dysphagia. The patient failed outpatient therapy with Augmentin.  Completed 8 day empiric course of levofloxacin as of 11/04/2011.  Was also on Vancomycin and Zosyn which have been discontinued.  Achalasia  Plan for surgery on 11/07/2011 with Dr. Daphine Deutscher. Dr. Ewing Schlein will need 48 hour notice before the planned surgery to get EGD done to "clean out" esophagus the day before the surgery.  Currently only on clear liquids.  Leukocytosis  Secondary to ongoing infection.  Resolved on antibiotics.   Thrombocytosis  Likely reactive, improved.  Pleural effusion, left  Therapeutic thoracentesis unsuccessful 10/29/11.  Attempted and another thoracentesis on 11/02/2011 had 700 cc removed, chest x-ray after thoracentesis shows no pneumothorax on 11/02/2011.  The patient to have repeat chest x-ray on 11/05/2011.  Microcytic anemia  Low iron but high ferritin suggestive of anemia of chronic disease.  Hemoglobin stable. Likely menstrual losses contributing.   Elevated D-Dimer  CT shows no evidence of acute pulmonary embolism.  Moderately large left pleural effusion with compressive atelectasis of the left lower lobe.  Prophylaxis Lovenox  Disposition Pending   LOS: 8 days  Jarmarcus Wambold A, MD 11/04/2011, 2:39 PM

## 2011-11-04 NOTE — Progress Notes (Signed)
General Surgery Note  LOS: 8 days   Assessment/Plan: 1.  Achalasia  Plan for surgery by Dr. Daphine Deutscher on 11/07/2011.  To stay in hospital until surgery date.  Patient had prior esophageal myotomy through the left chest >20 years ago.  GI - Dr. Roderic Scarce.  Plan for Dr. Ewing Schlein to "clean out" esophagus pre op.  Will repeat CXR on Monday 2.  Left pleural effusion  Tapped approx 700 cc 11/02/2011.  3.  Aspiration pneumonia  Probably secondary to achalasia. 4. Thrombocytosis, probably reactive. 5.  Microcytic anemia.  Hgb - 8.7 on 11/04/2011. 6.  D-dimer, elevated. 7.  VTE prophylaxis - Lovenox.  Needs to ambulate more.  Subjective:  Doing okay but hungry.  Tearful today.  Mother in room. Objective:   Filed Vitals:   11/03/11 2025  BP: 150/89  Pulse: 69  Temp: 98.8 F (37.1 C)  Resp: 18     Intake/Output from previous day:  02/23 0701 - 02/24 0700 In: 793.1 [P.O.:480; I.V.:163.1; IV Piggyback:150] Out: -   Intake/Output this shift:      Physical Exam:   General: WN AA F who is alert and oriented.    HEENT: Normal. Pupils equal. Good dentition. .   Lungs: Slight decrease breath sounds on left base.   Abdomen: soft   Neurologic:  Grossly intact to motor and sensory function.   Psychiatric: Has normal mood and affect. Behavior is normal   Lab Results:     Basename 11/04/11 0350 11/02/11 0335  WBC 7.9 8.8  HGB 8.7* 8.2*  HCT 26.9* 26.0*  PLT 460* 470*    BMET    Basename 11/04/11 0350 11/02/11 0335  NA 141 140  K 3.6 3.7  CL 106 105  CO2 25 25  GLUCOSE 97 91  BUN 6 5*  CREATININE 0.91 1.09  CALCIUM 9.7 9.2    PT/INR  No results found for this basename: LABPROT:2,INR:2 in the last 72 hours  ABG  No results found for this basename: PHART:2,PCO2:2,PO2:2,HCO3:2 in the last 72 hours   Studies/Results:  Dg Chest 1 View  11/02/2011  *RADIOLOGY REPORT*  Clinical Data: Status post left thoracentesis  CHEST - 1 VIEW  Comparison: Portable chest x-ray of 10/29/2011   Findings: The volume of the left pleural effusion has diminished considerably after left thoracentesis.  No pneumothorax is seen. The right lung is clear other than mild basilar atelectasis. Cardiomegaly is stable.  IMPRESSION: Significant reduction in volume of the left pleural effusion after left thoracentesis.  No pneumothorax.  Original Report Authenticated By: Juline Patch, M.D.   US Thoracentesis  11/02/2011  *RADIOLOGY REPORT*  Clinical Data:  History of achalasia with left pleural effusion. Request been made for therapeutic and diagnostic left-sided thoracentesis.  ULTRASOUND GUIDED left THORACENTESIS  Comparison:  Prior ultrasound of the chest and prior diagnostic thoracentesis.  An ultrasound guided thoracentesis was thoroughly discussed with the patient and questions answered.  The benefits, risks, alternatives and complications were also discussed.  The patient understands and wishes to proceed with the procedure.  Written consent was obtained.  Ultrasound was performed to localize and mark an adequate pocket of fluid in the left chest.  The area was then prepped and draped in the normal sterile fashion.  1% Lidocaine was used for local anesthesia.  Under ultrasound guidance a 19 gauge Yueh catheter was introduced.  Thoracentesis was performed.  The catheter was removed and a dressing applied.  Complications:  None immediate  Findings: A total of  approximately 700 ml of cloudy yellow serous fluid was removed. A fluid sample was sent for laboratory analysis.  IMPRESSION: Successful ultrasound guided left thoracentesis yielding 700 ml of pleural fluid.  Read by: Anselm Pancoast, P.A.-C  Original Report Authenticated By: Richarda Overlie, M.D.     Anti-infectives:   Anti-infectives     Start     Dose/Rate Route Frequency Ordered Stop   10/28/11 0800   vancomycin (VANCOCIN) IVPB 1000 mg/200 mL premix  Status:  Discontinued        1,000 mg 200 mL/hr over 60 Minutes Intravenous Every 8 hours 10/28/11  0604 10/31/11 1634   10/28/11 0800   piperacillin-tazobactam (ZOSYN) IVPB 3.375 g  Status:  Discontinued        3.375 g 12.5 mL/hr over 240 Minutes Intravenous Every 8 hours 10/28/11 0604 11/02/11 1355   10/28/11 0700   Levofloxacin (LEVAQUIN) IVPB 750 mg        750 mg 100 mL/hr over 90 Minutes Intravenous Every 24 hours 10/28/11 0603            Ovidio Kin, MD, FACS Pager: [phone],   [facility] Office: [phone] 11/04/2011

## 2011-11-05 ENCOUNTER — Inpatient Hospital Stay (HOSPITAL_COMMUNITY): Payer: PRIVATE HEALTH INSURANCE

## 2011-11-05 MED ORDER — ENSURE CLINICAL ST REVIGOR PO LIQD: 237.0000 mL | Freq: Three times a day (TID) | ORAL | Status: DC | PRN

## 2011-11-05 NOTE — Plan of Care (Signed)
Problem: Inadequate Intake (NI-2.1) Goal: Food and/or nutrient delivery Individualized approach for food/nutrient provision.  Outcome: Not Progressing Minimal clear intake, NPO for EGD tomorrow then surgery on Wednesday

## 2011-11-05 NOTE — Progress Notes (Signed)
   CARE MANAGEMENT NOTE 11/05/2011  Patient:  [name], [name]   Account Number:  [id number]  Date Initiated:  10/29/2011  Documentation initiated by:  Jiles Crocker  Subjective/Objective Assessment:   ADMITTED WITH PNEUMONIA     Action/Plan:   PCP IS DR Shana Chute; LIVES AT HOME WITH SPOUSE   Anticipated DC Date:  11/09/2011   Anticipated DC Plan:  HOME/SELF CARE         Choice offered to / List presented to:             Status of service:  In process, will continue to follow Medicare Important Message given?   (If response is "NO", the following Medicare IM given date fields will be blank) Date Medicare IM given:   Date Additional Medicare IM given:    Discharge Disposition:    Per UR Regulation:  Reviewed for med. necessity/level of care/duration of stay  Comments:  11/05/11 Daniya Aramburo RN,BSN NCM [phone] SX FOLLOWING- FOR EGD IN AM,HELLER MYOTOMY 11/07/11.TO TRANSFER TO DR. MARTIN SERVICE IN AM.  11/01/11 Deshane Cotroneo RN,BSN NCM [phone] NOTED SX FOLLOWING FOR SX NEXT WEEK.NOTED LARGE PLEURAL EFFUSION, WILL NEED TAP PRIOR SX.PROVIDED PATIENT W/PCP [facility].  10/29/2011- Sofie Rower RN, BSN, MHA

## 2011-11-05 NOTE — Progress Notes (Signed)
Nutrition Follow-up  Diet Order: Clear liquid  - Pt scheduled to have EGD tomorrow and redo myotomy on Wednesday. Pt with left pleural effusion that was tapped 2/22 with output. Pt c/o being tired of clear liquids as she has been on nothing but liquids for the past 30 days. Pt did not like [employer], interested in getting Ensure. Paged Dr. Daphine Deutscher who said okay for Ensure.   DG of chest on 2/25 showed: Further accumulation of pleural fluid on the left with more left  base atelectasis.  Mild patchy volume loss right base.  Findings of achalasia.   Meds: Scheduled Meds:   . enoxaparin  40 mg Subcutaneous Q24H  . DISCONTD: feeding supplement  1 Container Oral TID WC   Continuous Infusions:  PRN Meds:.albuterol, benzonatate, HYDROcodone-acetaminophen, HYDROcodone-homatropine, ipratropium, LORazepam, morphine, ondansetron (ZOFRAN) IV  Labs:  CMP     Component Value Date/Time   NA 141 11/04/2011 0350   K 3.6 11/04/2011 0350   CL 106 11/04/2011 0350   CO2 25 11/04/2011 0350   GLUCOSE 97 11/04/2011 0350   BUN 6 11/04/2011 0350   CREATININE 0.91 11/04/2011 0350   CALCIUM 9.7 11/04/2011 0350   PROT 8.3 10/27/2011 1953   ALBUMIN 3.2* 10/27/2011 1953   AST 45* 10/27/2011 1953   ALT 52* 10/27/2011 1953   ALKPHOS 80 10/27/2011 1953   BILITOT 0.3 10/27/2011 1953   GFRNONAA 77* 11/04/2011 0350   GFRAA 90* 11/04/2011 0350     Intake/Output Summary (Last 24 hours) at 11/05/11 1532 Last data filed at 11/05/11 1300  Gross per 24 hour  Intake    420 ml  Output      0 ml  Net    420 ml   Last BM - 11/03/11  Weight Status: No new weights  Nutrition Dx: Inadequate oral intake - ongoing  Goal: Diet advancement - not met, will be NPO for EGD tomorrow then surgery on Wednesday.   Intervention: Diet advancement per MD. Recommend enteral nutrition if appropriate, otherwise nutrition support as pt without adequate nutrition for the past 7 days and likely 1 month before that as pt's intake PTA  was only liquids. Will send pt Ensure.   Monitor: Weights, labs, diet advancement, ? nutrition support   Marshall Cork Pager #: [phone]

## 2011-11-05 NOTE — Progress Notes (Signed)
Subjective: No specific complaints.  Denies any pain.  Has not had any vomiting.  Objective: Vital signs in last 24 hours: Filed Vitals:   11/04/11 1500 11/04/11 2040 11/05/11 0444 11/05/11 1441  BP: 136/86 132/83 121/80 147/87  Pulse: 71 67 77 71  Temp: 98.5 F (36.9 C) 98.4 F (36.9 C) 98.8 F (37.1 C) 98.4 F (36.9 C)  TempSrc:  Oral Oral Oral  Resp: 20 18 18 18  Height:      Weight:      SpO2: 98% 96% 96% 99%   Weight change:   Intake/Output Summary (Last 24 hours) at 11/05/11 1650 Last data filed at 11/05/11 1300  Gross per 24 hour  Intake    420 ml  Output      0 ml  Net    420 ml    Physical Exam: General: Awake, Oriented, No acute distress. HEENT: EOMI. Neck: Supple CV: S1 and S2 Lungs: Crackles bilaterally, decreased breath sounds at left lung base. Abdomen: Soft, Nontender, Nondistended, +bowel sounds. Ext: Good pulses. Trace edema.  Lab Results:  Basename 11/04/11 0350  NA 141  K 3.6  CL 106  CO2 25  GLUCOSE 97  BUN 6  CREATININE 0.91  CALCIUM 9.7  MG --  PHOS --   No results found for this basename: AST:2,ALT:2,ALKPHOS:2,BILITOT:2,PROT:2,ALBUMIN:2 in the last 72 hours No results found for this basename: LIPASE:2,AMYLASE:2 in the last 72 hours  Basename 11/04/11 0350  WBC 7.9  NEUTROABS --  HGB 8.7*  HCT 26.9*  MCV 62.9*  PLT 460*   No results found for this basename: CKTOTAL:3,CKMB:3,CKMBINDEX:3,TROPONINI:3 in the last 72 hours No components found with this basename: POCBNP:3 No results found for this basename: DDIMER:2 in the last 72 hours No results found for this basename: HGBA1C:2 in the last 72 hours No results found for this basename: CHOL:2,HDL:2,LDLCALC:2,TRIG:2,CHOLHDL:2,LDLDIRECT:2 in the last 72 hours No results found for this basename: TSH,T4TOTAL,FREET3,T3FREE,THYROIDAB in the last 72 hours No results found for this basename: VITAMINB12:2,FOLATE:2,FERRITIN:2,TIBC:2,IRON:2,RETICCTPCT:2 in the last 72 hours  Micro  Results: Recent Results (from the past 240 hour(s))  CULTURE, BLOOD (ROUTINE X 2)     Status: Normal   Collection Time   10/28/11  1:15 AM      Component Value Range Status Comment   Specimen Description BLOOD RIGHT ANTECUBITAL   Final    Special Requests BOTTLES DRAWN AEROBIC AND ANAEROBIC 5CC   Final    Culture  Setup Time [id number]   Final    Culture NO GROWTH 5 DAYS   Final    Report Status 11/03/2011 FINAL   Final   CULTURE, BLOOD (ROUTINE X 2)     Status: Normal   Collection Time   10/28/11  1:25 AM      Component Value Range Status Comment   Specimen Description BLOOD RIGHT HAND   Final    Special Requests     Final    Value: BOTTLES DRAWN AEROBIC AND ANAEROBIC 5CC AEROBIC/4CC ANAEROBIC   Culture  Setup Time [id number]   Final    Culture NO GROWTH 5 DAYS   Final    Report Status 11/03/2011 FINAL   Final   CULTURE, ROUTINE-ABSCESS     Status: Normal   Collection Time   10/29/11  3:19 PM      Component Value Range Status Comment   Specimen Description ABSCESS   Final    Special Requests NONE   Final    Gram Stain     Final      Value: FEW WBC PRESENT,BOTH PMN AND MONONUCLEAR     NO SQUAMOUS EPITHELIAL CELLS SEEN     NO ORGANISMS SEEN   Culture NO GROWTH 3 DAYS   Final    Report Status 11/02/2011 FINAL   Final   BODY FLUID CULTURE     Status: Normal (Preliminary result)   Collection Time   11/02/11  2:04 PM      Component Value Range Status Comment   Specimen Description PLEURAL   Final    Special Requests Normal   Final    Gram Stain     Final    Value: NO WBC SEEN     NO ORGANISMS SEEN   Culture NO GROWTH 3 DAYS   Final    Report Status PENDING   Incomplete     Studies/Results: Dg Chest 2 View  11/05/2011  *RADIOLOGY REPORT*  Clinical Data: Left pleural effusion.  [facility].  CHEST - 2 VIEW  Comparison: 11/02/2011  Findings: Heart size remains normal.  There has been further accumulation of pleural fluid on the left with left base atelectasis.  There is mild  atelectasis at the right base. Mediastinal shadow related to dilated esophagus containing fluid and air remains evident, consistent with the patient's history of achalasia.  No significant bony finding.  IMPRESSION: Further accumulation of pleural fluid on the left with more left base atelectasis.  Mild patchy volume loss right base.  Findings of achalasia.  Original Report Authenticated By: MARK E. SHOGRY, M.D.    Medications: I have reviewed the patient's current medications. Scheduled Meds:    . enoxaparin  40 mg Subcutaneous Q24H  . DISCONTD: feeding supplement  1 Container Oral TID WC   Continuous Infusions:   PRN Meds:.albuterol, benzonatate, feeding supplement, HYDROcodone-acetaminophen, HYDROcodone-homatropine, ipratropium, LORazepam, morphine, ondansetron (ZOFRAN) IV  Assessment/Plan: Aspiration pneumonia  Secondary to achalasia induced dysphagia. The patient failed outpatient therapy with Augmentin.  Completed 8 day empiric course of levofloxacin as of 11/04/2011.  Was also on Vancomycin and Zosyn which have been discontinued.  Achalasia  Plan for surgery on 11/07/2011 with Dr. Martin. Dr. Schooler will plan an EGD tomorrow to remove any fluid/debris from the esophagus prior to Heller myotomy by Dr. Marin. Currently only on clear liquids.  Leukocytosis  Secondary to ongoing infection.  Resolved on antibiotics.   Thrombocytosis  Likely reactive, improved.  Pleural effusion, left  Therapeutic thoracentesis unsuccessful 10/29/11.  Attempted and another thoracentesis on 11/02/2011 had 700 cc removed.  Culture of the pleural fluid from 11/02/2011 shows no organisms or WBC.  Chest x-ray 2 view on 11/05/2011 showed accumulation of pleural fluid on the left with left base atelectasis.  Microcytic anemia  Low iron but high ferritin suggestive of anemia of chronic disease.  Hemoglobin stable. Likely menstrual losses contributing.   Elevated D-Dimer  CT shows no evidence of acute  pulmonary embolism.  Moderately large left pleural effusion with compressive atelectasis of the left lower lobe.  Prophylaxis Lovenox  Disposition Pending, surgery planned on 11/07/2011.  Discussed with Dr. Martin with general surgery who has graciously accepted the patient onto his service on 11/06/2011.  Will transfer the patient to his care tomorrow.   LOS: 9 days  Pascal Stiggers A, MD 11/05/2011, 4:50 PM  

## 2011-11-05 NOTE — Progress Notes (Signed)
Patient ID: [name], female   DOB: Nov 23, 1970, 41 y.o.   MRN: [id number]  EGD planned for tomorrow (Tuesday) 8AM for preop procedure to remove any fluid/debris from the esophageal lumen prior to planned Heller myotomy by Dr. Daphine Deutscher (planned for 11/07/11). Mother at bedside. Deferred questions regarding length of recovery from surgery to Dr. Daphine Deutscher.

## 2011-11-05 NOTE — Progress Notes (Signed)
Patient ID: [name], female   DOB: 1971-07-18, 41 y.o.   MRN: [id number] [facility] Progress Note:   * No surgery found *  Subjective: Mental status is clear.  Discussed possibility of placing a chest tube at the time of her surgery.   Objective: Vital signs in last 24 hours: Temp:  [98.4 F (36.9 C)-98.8 F (37.1 C)] 98.8 F (37.1 C) (02/25 0444) Pulse Rate:  [67-77] 77  (02/25 0444) Resp:  [18-20] 18  (02/25 0444) BP: (121-136)/(80-86) 121/80 mmHg (02/25 0444) SpO2:  [96 %-98 %] 96 % (02/25 0444)  Intake/Output from previous day: 02/24 0701 - 02/25 0700 In: 600 [P.O.:600] Out: -  Intake/Output this shift:    Physical Exam: Work of breathing is  Better according to her.  No pain  Lab Results:  Results for orders placed during the hospital encounter of 10/27/11 (from the past 48 hour(s))  CBC     Status: Abnormal   Collection Time   11/04/11  3:50 AM      Component Value Range Comment   WBC 7.9  4.0 - 10.5 (K/uL)    RBC 4.28  3.87 - 5.11 (MIL/uL)    Hemoglobin 8.7 (*) 12.0 - 15.0 (g/dL)    HCT 40.9 (*) 81.1 - 46.0 (%)    MCV 62.9 (*) 78.0 - 100.0 (fL)    MCH 20.3 (*) 26.0 - 34.0 (pg)    MCHC 32.3  30.0 - 36.0 (g/dL)    RDW 91.4 (*) 78.2 - 15.5 (%)    Platelets 460 (*) 150 - 400 (K/uL)   BASIC METABOLIC PANEL     Status: Abnormal   Collection Time   11/04/11  3:50 AM      Component Value Range Comment   Sodium 141  135 - 145 (mEq/L)    Potassium 3.6  3.5 - 5.1 (mEq/L)    Chloride 106  96 - 112 (mEq/L)    CO2 25  19 - 32 (mEq/L)    Glucose, Bld 97  70 - 99 (mg/dL)    BUN 6  6 - 23 (mg/dL)    Creatinine, Ser 9.56  0.50 - 1.10 (mg/dL)    Calcium 9.7  8.4 - 10.5 (mg/dL)    GFR calc non Af Amer 77 (*) >90 (mL/min)    GFR calc Af Amer 90 (*) >90 (mL/min)     Radiology/Results: Dg Chest 2 View  11/05/2011  *RADIOLOGY REPORT*  Clinical Data: Left pleural effusion.  [address].  CHEST - 2 VIEW  Comparison: 11/02/2011  Findings: Heart size remains  normal.  There has been further accumulation of pleural fluid on the left with left base atelectasis.  There is mild atelectasis at the right base. Mediastinal shadow related to dilated esophagus containing fluid and air remains evident, consistent with the patient's history of achalasia.  No significant bony finding.  IMPRESSION: Further accumulation of pleural fluid on the left with more left base atelectasis.  Mild patchy volume loss right base.  Findings of achalasia.  Original Report Authenticated By: Thomasenia Sales, M.D.    Anti-infectives: Anti-infectives     Start     Dose/Rate Route Frequency Ordered Stop   10/28/11 0800   vancomycin (VANCOCIN) IVPB 1000 mg/200 mL premix  Status:  Discontinued        1,000 mg 200 mL/hr over 60 Minutes Intravenous Every 8 hours 10/28/11 0604 10/31/11 1634   10/28/11 0800   piperacillin-tazobactam (ZOSYN) IVPB 3.375 g  Status:  Discontinued        3.375 g 12.5 mL/hr over 240 Minutes Intravenous Every 8 hours 10/28/11 0604 11/02/11 1355   10/28/11 0700   Levofloxacin (LEVAQUIN) IVPB 750 mg  Status:  Discontinued        750 mg 100 mL/hr over 90 Minutes Intravenous Every 24 hours 10/28/11 0603 11/04/11 1440          Assessment/Plan: Problem List: Patient Active Problem List  Diagnoses  . Achalasia  . Leukocytosis  . Thrombocytosis  . Pericardial effusion  . Pleural effusion, left  . Microcytic anemia  . Tachycardia  . SIRS (systemic inflammatory response syndrome)  . Aspiration pneumonia  . D-dimer, elevated    Have discussed endoscopy with Dr. Bosie Clos who will perform tomorrow.  Plan redo myotomy on Wednesday.  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   * No surgery found *    LOS: 9 days   Matt B. Daphine Deutscher, MD, [facility] [phone] beeper [phone]  11/05/2011 10:06 AM

## 2011-11-06 ENCOUNTER — Encounter (HOSPITAL_COMMUNITY): Payer: Self-pay | Admitting: *Deleted

## 2011-11-06 ENCOUNTER — Encounter (HOSPITAL_COMMUNITY)
Admission: EM | Disposition: A | Discharge status: Home / Self Care | Payer: Self-pay | Source: Home / Self Care | Attending: Internal Medicine

## 2011-11-06 ENCOUNTER — Inpatient Hospital Stay (HOSPITAL_COMMUNITY): Admission: RE | Admit: 2011-11-06 | Payer: PRIVATE HEALTH INSURANCE | Source: Ambulatory Visit

## 2011-11-06 HISTORY — PX: ESOPHAGOGASTRODUODENOSCOPY: SHX5428

## 2011-11-06 LAB — BODY FLUID CULTURE
Culture: NO GROWTH
Gram Stain: NONE SEEN
Special Requests: NORMAL

## 2011-11-06 SURGERY — EGD (ESOPHAGOGASTRODUODENOSCOPY)
Anesthesia: Moderate Sedation

## 2011-11-06 MED ORDER — MIDAZOLAM HCL 10 MG/2ML IJ SOLN
INTRAMUSCULAR | Status: AC
  Filled 2011-11-06: qty 2

## 2011-11-06 MED ORDER — DIPHENHYDRAMINE HCL 50 MG/ML IJ SOLN
INTRAMUSCULAR | Status: AC
  Filled 2011-11-06: qty 1

## 2011-11-06 MED ORDER — SODIUM CHLORIDE 0.9 % IV SOLN
Freq: Once | INTRAVENOUS | Status: AC
  Administered 2011-11-06: 500 mL via INTRAVENOUS

## 2011-11-06 MED ORDER — SODIUM CHLORIDE 0.9 % IV SOLN
INTRAVENOUS | Status: DC
  Administered 2011-11-06: 16:00:00 via INTRAVENOUS
  Administered 2011-11-07: 1000 mL via INTRAVENOUS

## 2011-11-06 MED ORDER — FENTANYL NICU IV SYRINGE 50 MCG/ML
INJECTION | INTRAMUSCULAR | Status: DC | PRN
  Administered 2011-11-06 (×5): 25 ug via INTRAVENOUS

## 2011-11-06 MED ORDER — DIPHENHYDRAMINE HCL 50 MG/ML IJ SOLN
INTRAMUSCULAR | Status: DC | PRN
  Administered 2011-11-06: 25 mg via INTRAVENOUS

## 2011-11-06 MED ORDER — FENTANYL CITRATE 0.05 MG/ML IJ SOLN
INTRAMUSCULAR | Status: AC
  Filled 2011-11-06: qty 2

## 2011-11-06 MED ORDER — MIDAZOLAM HCL 10 MG/2ML IJ SOLN
INTRAMUSCULAR | Status: DC | PRN
  Administered 2011-11-06: 2.5 mg via INTRAVENOUS
  Administered 2011-11-06: 2 mg via INTRAVENOUS
  Administered 2011-11-06: 2.5 mg via INTRAVENOUS
  Administered 2011-11-06 (×2): 1 mg via INTRAVENOUS
  Administered 2011-11-06 (×2): 2.5 mg via INTRAVENOUS

## 2011-11-06 NOTE — H&P (View-Only) (Signed)
Subjective: No specific complaints.  Denies any pain.  Has not had any vomiting.  Objective: Vital signs in last 24 hours: Filed Vitals:   11/04/11 1500 11/04/11 2040 11/05/11 0444 11/05/11 1441  BP: 136/86 132/83 121/80 147/87  Pulse: 71 67 77 71  Temp: 98.5 F (36.9 C) 98.4 F (36.9 C) 98.8 F (37.1 C) 98.4 F (36.9 C)  TempSrc:  Oral Oral Oral  Resp: 20 18 18 18   Height:      Weight:      SpO2: 98% 96% 96% 99%   Weight change:   Intake/Output Summary (Last 24 hours) at 11/05/11 1650 Last data filed at 11/05/11 1300  Gross per 24 hour  Intake    420 ml  Output      0 ml  Net    420 ml    Physical Exam: General: Awake, Oriented, No acute distress. HEENT: EOMI. Neck: Supple CV: S1 and S2 Lungs: Crackles bilaterally, decreased breath sounds at left lung base. Abdomen: Soft, Nontender, Nondistended, +bowel sounds. Ext: Good pulses. Trace edema.  Lab Results:  [facility] 11/04/11 0350  NA 141  K 3.6  CL 106  CO2 25  GLUCOSE 97  BUN 6  CREATININE 0.91  CALCIUM 9.7  MG --  PHOS --   No results found for this basename: AST:2,ALT:2,ALKPHOS:2,BILITOT:2,PROT:2,ALBUMIN:2 in the last 72 hours No results found for this basename: LIPASE:2,AMYLASE:2 in the last 72 hours  Basename 11/04/11 0350  WBC 7.9  NEUTROABS --  HGB 8.7*  HCT 26.9*  MCV 62.9*  PLT 460*   No results found for this basename: CKTOTAL:3,CKMB:3,CKMBINDEX:3,TROPONINI:3 in the last 72 hours No components found with this basename: POCBNP:3 No results found for this basename: DDIMER:2 in the last 72 hours No results found for this basename: HGBA1C:2 in the last 72 hours No results found for this basename: CHOL:2,HDL:2,LDLCALC:2,TRIG:2,CHOLHDL:2,LDLDIRECT:2 in the last 72 hours No results found for this basename: TSH,T4TOTAL,FREET3,T3FREE,THYROIDAB in the last 72 hours No results found for this basename: VITAMINB12:2,FOLATE:2,FERRITIN:2,TIBC:2,IRON:2,RETICCTPCT:2 in the last 72 hours  Micro  Results: Recent Results (from the past 240 hour(s))  CULTURE, BLOOD (ROUTINE X 2)     Status: Normal   Collection Time   10/28/11  1:15 AM      Component Value Range Status Comment   Specimen Description BLOOD RIGHT ANTECUBITAL   Final    Special Requests BOTTLES DRAWN AEROBIC AND ANAEROBIC 5CC   Final    Culture  Setup Time [id number]   Final    Culture NO GROWTH 5 DAYS   Final    Report Status 11/03/2011 FINAL   Final   CULTURE, BLOOD (ROUTINE X 2)     Status: Normal   Collection Time   10/28/11  1:25 AM      Component Value Range Status Comment   Specimen Description BLOOD RIGHT HAND   Final    Special Requests     Final    Value: BOTTLES DRAWN AEROBIC AND ANAEROBIC 5CC AEROBIC/4CC ANAEROBIC   Culture  Setup Time [id number]   Final    Culture NO GROWTH 5 DAYS   Final    Report Status 11/03/2011 FINAL   Final   CULTURE, ROUTINE-ABSCESS     Status: Normal   Collection Time   10/29/11  3:19 PM      Component Value Range Status Comment   Specimen Description ABSCESS   Final    Special Requests NONE   Final    Gram Stain     Final  Value: FEW WBC PRESENT,BOTH PMN AND MONONUCLEAR     NO SQUAMOUS EPITHELIAL CELLS SEEN     NO ORGANISMS SEEN   Culture NO GROWTH 3 DAYS   Final    Report Status 11/02/2011 FINAL   Final   BODY FLUID CULTURE     Status: Normal (Preliminary result)   Collection Time   11/02/11  2:04 PM      Component Value Range Status Comment   Specimen Description PLEURAL   Final    Special Requests Normal   Final    Gram Stain     Final    Value: NO WBC SEEN     NO ORGANISMS SEEN   Culture NO GROWTH 3 DAYS   Final    Report Status PENDING   Incomplete     Studies/Results: Dg Chest 2 View  11/05/2011  *RADIOLOGY REPORT*  Clinical Data: Left pleural effusion.  [facility] [address].  CHEST - 2 VIEW  Comparison: 11/02/2011  Findings: Heart size remains normal.  There has been further accumulation of pleural fluid on the left with left base atelectasis.  There is mild  atelectasis at the right base. Mediastinal shadow related to dilated esophagus containing fluid and air remains evident, consistent with the patient's history of achalasia.  No significant bony finding.  IMPRESSION: Further accumulation of pleural fluid on the left with more left base atelectasis.  Mild patchy volume loss right base.  Findings of achalasia.  Original Report Authenticated By: Thomasenia Sales, M.D.    Medications: I have reviewed the patient's current medications. Scheduled Meds:    . enoxaparin  40 mg Subcutaneous Q24H  . DISCONTD: feeding supplement  1 Container Oral TID WC   Continuous Infusions:   PRN Meds:.albuterol, benzonatate, feeding supplement, HYDROcodone-acetaminophen, HYDROcodone-homatropine, ipratropium, LORazepam, morphine, ondansetron (ZOFRAN) IV  Assessment/Plan: Aspiration pneumonia  Secondary to achalasia induced dysphagia. The patient failed outpatient therapy with Augmentin.  Completed 8 day empiric course of levofloxacin as of 11/04/2011.  Was also on Vancomycin and Zosyn which have been discontinued.  Achalasia  Plan for surgery on 11/07/2011 with Dr. Daphine Deutscher. Dr. Bosie Clos will plan an EGD tomorrow to remove any fluid/debris from the esophagus prior to [facility] myotomy by Dr. Sallyanne Havers. Currently only on clear liquids.  Leukocytosis  Secondary to ongoing infection.  Resolved on antibiotics.   Thrombocytosis  Likely reactive, improved.  Pleural effusion, left  Therapeutic thoracentesis unsuccessful 10/29/11.  Attempted and another thoracentesis on 11/02/2011 had 700 cc removed.  Culture of the pleural fluid from 11/02/2011 shows no organisms or WBC.  Chest x-ray 2 view on 11/05/2011 showed accumulation of pleural fluid on the left with left base atelectasis.  Microcytic anemia  Low iron but high ferritin suggestive of anemia of chronic disease.  Hemoglobin stable. Likely menstrual losses contributing.   Elevated D-Dimer  CT shows no evidence of acute  pulmonary embolism.  Moderately large left pleural effusion with compressive atelectasis of the left lower lobe.  Prophylaxis Lovenox  Disposition Pending, surgery planned on 11/07/2011.  Discussed with Dr. Daphine Deutscher with general surgery who has graciously accepted the patient onto his service on 11/06/2011.  Will transfer the patient to his care tomorrow.   LOS: 9 days  Marliss Buttacavoli A, MD 11/05/2011, 4:50 PM

## 2011-11-06 NOTE — Progress Notes (Signed)
Subjective: No specific complaints.    Objective: Vital signs in last 24 hours: Filed Vitals:   11/06/11 0937 11/06/11 1040 11/06/11 1123 11/06/11 1400  BP: 115/79 123/82 143/90 114/75  Pulse: 72 70 67 70  Temp: 97.3 F (36.3 C) 98.4 F (36.9 C) 98.8 F (37.1 C) 98.7 F (37.1 C)  TempSrc: Oral Oral Oral Oral  Resp: 18 18 20 18   Height:      Weight:      SpO2: 97% 96% 96% 97%   Weight change:   Intake/Output Summary (Last 24 hours) at 11/06/11 1647 Last data filed at 11/06/11 0921  Gross per 24 hour  Intake    860 ml  Output      0 ml  Net    860 ml    Physical Exam: General: Awake, Oriented, No acute distress. HEENT: EOMI. Neck: Supple CV: S1 and S2 Lungs: Crackles bilaterally, decreased breath sounds at left lung base. Abdomen: Soft, Nontender, Nondistended, +bowel sounds. Ext: Good pulses. Trace edema.  Lab Results:  [facility] 11/04/11 0350  NA 141  K 3.6  CL 106  CO2 25  GLUCOSE 97  BUN 6  CREATININE 0.91  CALCIUM 9.7  MG --  PHOS --   No results found for this basename: AST:2,ALT:2,ALKPHOS:2,BILITOT:2,PROT:2,ALBUMIN:2 in the last 72 hours No results found for this basename: LIPASE:2,AMYLASE:2 in the last 72 hours  Basename 11/04/11 0350  WBC 7.9  NEUTROABS --  HGB 8.7*  HCT 26.9*  MCV 62.9*  PLT 460*   No results found for this basename: CKTOTAL:3,CKMB:3,CKMBINDEX:3,TROPONINI:3 in the last 72 hours No components found with this basename: POCBNP:3 No results found for this basename: DDIMER:2 in the last 72 hours No results found for this basename: HGBA1C:2 in the last 72 hours No results found for this basename: CHOL:2,HDL:2,LDLCALC:2,TRIG:2,CHOLHDL:2,LDLDIRECT:2 in the last 72 hours No results found for this basename: TSH,T4TOTAL,FREET3,T3FREE,THYROIDAB in the last 72 hours No results found for this basename: VITAMINB12:2,FOLATE:2,FERRITIN:2,TIBC:2,IRON:2,RETICCTPCT:2 in the last 72 hours  Micro Results: Recent Results (from the past 240  hour(s))  CULTURE, BLOOD (ROUTINE X 2)     Status: Normal   Collection Time   10/28/11  1:15 AM      Component Value Range Status Comment   Specimen Description BLOOD RIGHT ANTECUBITAL   Final    Special Requests BOTTLES DRAWN AEROBIC AND ANAEROBIC 5CC   Final    Culture  Setup Time [id number]   Final    Culture NO GROWTH 5 DAYS   Final    Report Status 11/03/2011 FINAL   Final   CULTURE, BLOOD (ROUTINE X 2)     Status: Normal   Collection Time   10/28/11  1:25 AM      Component Value Range Status Comment   Specimen Description BLOOD RIGHT HAND   Final    Special Requests     Final    Value: BOTTLES DRAWN AEROBIC AND ANAEROBIC 5CC AEROBIC/4CC ANAEROBIC   Culture  Setup Time [id number]   Final    Culture NO GROWTH 5 DAYS   Final    Report Status 11/03/2011 FINAL   Final   CULTURE, ROUTINE-ABSCESS     Status: Normal   Collection Time   10/29/11  3:19 PM      Component Value Range Status Comment   Specimen Description ABSCESS   Final    Special Requests NONE   Final    Gram Stain     Final    Value: FEW WBC PRESENT,BOTH PMN  AND MONONUCLEAR     NO SQUAMOUS EPITHELIAL CELLS SEEN     NO ORGANISMS SEEN   Culture NO GROWTH 3 DAYS   Final    Report Status 11/02/2011 FINAL   Final   BODY FLUID CULTURE     Status: Normal   Collection Time   11/02/11  2:04 PM      Component Value Range Status Comment   Specimen Description PLEURAL   Final    Special Requests Normal   Final    Gram Stain     Final    Value: NO WBC SEEN     NO ORGANISMS SEEN   Culture NO GROWTH 3 DAYS   Final    Report Status 11/06/2011 FINAL   Final     Studies/Results: Dg Chest 2 View  11/05/2011  *RADIOLOGY REPORT*  Clinical Data: Left pleural effusion.  [facility] [address].  CHEST - 2 VIEW  Comparison: 11/02/2011  Findings: Heart size remains normal.  There has been further accumulation of pleural fluid on the left with left base atelectasis.  There is mild atelectasis at the right base. Mediastinal shadow related to  dilated esophagus containing fluid and air remains evident, consistent with the patient's history of achalasia.  No significant bony finding.  IMPRESSION: Further accumulation of pleural fluid on the left with more left base atelectasis.  Mild patchy volume loss right base.  Findings of achalasia.  Original Report Authenticated By: Thomasenia Sales, M.D.    Medications: I have reviewed the patient's current medications. Scheduled Meds:    . sodium chloride   Intravenous Once  . enoxaparin  40 mg Subcutaneous Q24H   Continuous Infusions:    . sodium chloride 60 mL/hr at 11/06/11 1553   PRN Meds:.albuterol, benzonatate, feeding supplement, HYDROcodone-acetaminophen, HYDROcodone-homatropine, ipratropium, LORazepam, morphine, ondansetron (ZOFRAN) IV, DISCONTD: diphenhydrAMINE, DISCONTD: fentaNYL, DISCONTD: midazolam  Assessment/Plan: Aspiration pneumonia  Secondary to achalasia induced dysphagia. The patient failed outpatient therapy with Augmentin.  Completed 8 day empiric course of levofloxacin as of 11/04/2011.  Was also on Vancomycin and Zosyn which have been discontinued.  Achalasia  Plan for surgery on 11/07/2011 with Dr. Daphine Deutscher. Dr. Bosie Clos performed the EGD today and "cleaned" esophagus.  N.p.o.  Leukocytosis  Secondary to ongoing infection.  Resolved on antibiotics.   Thrombocytosis  Likely reactive, improved.  Pleural effusion, left  Therapeutic thoracentesis unsuccessful 10/29/11.  Attempted and another thoracentesis on 11/02/2011 had 700 cc removed.  Culture of the pleural fluid from 11/02/2011 shows no organisms or WBC.  Chest x-ray 2 view on 11/05/2011 showed accumulation of pleural fluid on the left with left base atelectasis.  Dr. Daphine Deutscher may consider chest tube prior to surgery on 11/07/2011.  Microcytic anemia  Low iron but high ferritin suggestive of anemia of chronic disease.  Hemoglobin stable. Likely menstrual losses contributing.   Elevated D-Dimer  CT shows no  evidence of acute pulmonary embolism.  Moderately large left pleural effusion with compressive atelectasis of the left lower lobe.  Prophylaxis Lovenox  Disposition Pending, surgery planned on 11/07/2011.  As per discussion with Dr. Daphine Deutscher on 11/05/2011, will transfer the patient to Dr. Ermalene Searing service.  Please don't hesitate to contact hospitalist service for any questions or concerns.  Patient medically stable at this time.     LOS: 10 days  Sharlena Kristensen A, MD 11/06/2011, 4:47 PM

## 2011-11-06 NOTE — Op Note (Signed)
[facility] [address] [place], [place]  [address]  ENDOSCOPY PROCEDURE REPORT  PATIENT:  [name], [name]  MR#:  [id number] BIRTHDATE:  01-27-1971, 40 yrs. old  GENDER:  female  ENDOSCOPIST:  Charlott Rakes, MD Referred by:  PROCEDURE DATE:  11/06/2011 PROCEDURE:  EGD with foreign body removal ASA CLASS:  Class III INDICATIONS:  foreign body, Botox injection for achalasia, dysphagia  MEDICATIONS:   Benadryl 25 mg IV, Fentanyl 125 mcg IV, Versed 14 mg IV, Cetacaine spray x 2 spray  TOPICAL ANESTHETIC:  DESCRIPTION OF PROCEDURE:   After the risks benefits and alternatives of the procedure were thoroughly explained, informed consent was obtained.  The Pentax Gastroscope [id number] endoscope was introduced through the mouth and advanced to the second portion of the duodenum, without limitations.  The instrument was slowly withdrawn as the mucosa was fully examined. <<PROCEDUREIMAGES>>  FINDINGS:  The endoscope was inserted into the oropharynx and esophagus was intubated.  In the mid-esophagus was a large amount of white fluid and soft food that obstructed the lumen and extended from 30cm to 45cm (GEJ) from the incisors. A Lucina Mellow net was used to remove portions of this food with multiple passes. A BioVac device was then connected to suction a good portion of the food debris and fluid. A Lucina Mellow net was reinserted to remove additional pieces of food and the remaining food bolus passed through the gastroesophageal junction into the stomach. The gastroesophageal junction was noted to be 45cm from the incisors. The esophageal lining was noted to be significantly dilated with scarring noted consistent with achalasia.  Endoscope was advanced into the stomach without difficulty (after clearance of the food bolus). The stomach revealed soft food in the dependent portion of the proximal stomach. The gastric mucosa was unremarkable.  The endoscope was advanced to the duodenal bulb  and second portion of duodenum which were unremarkable.  The endoscope was withdrawn back into the stomach and retroflexion revealed a normal appearing gastric cardia. Part of fundus was obscured by the food particles previously noted.  COMPLICATIONS:  None  ENDOSCOPIC IMPRESSION:  1. Food impaction -s/p removal 2. Esophageal scarring and dilation due to known history of achalasia  RECOMMENDATIONS:     1. Strict NPO for planned surgery (Heller myotomy) 2. Aspiration precautions  REPEAT EXAM:  N/A  ______________________________ Charlott Rakes, MD  CC:  Dr. Luretha Murphy  n. eSIGNED:   Charlott Rakes at 11/06/2011 09:07 AM  Leamon Arnt, [id number]

## 2011-11-06 NOTE — Brief Op Note (Signed)
See endopro note for details. Large amount of soft food and fluid in distal esophagus that was cleared out as stated in EGD report.

## 2011-11-06 NOTE — Interval H&P Note (Signed)
History and Physical Interval Note:  11/06/2011 8:19 AM  [name]  has presented today for surgery, with the diagnosis of acch.  The various methods of treatment have been discussed with the patient and family. After consideration of risks, benefits and other options for treatment, the patient has consented to  Procedure(s) (LRB): ESOPHAGOGASTRODUODENOSCOPY (EGD) (N/A) as a surgical intervention .  The patients' history has been reviewed, patient examined, no change in status, stable for surgery.  I have reviewed the patients' chart and labs.  Questions were answered to the patient's satisfaction.     [name]

## 2011-11-07 ENCOUNTER — Encounter (HOSPITAL_COMMUNITY): Payer: Self-pay | Admitting: *Deleted

## 2011-11-07 ENCOUNTER — Inpatient Hospital Stay (HOSPITAL_COMMUNITY): Payer: PRIVATE HEALTH INSURANCE | Admitting: *Deleted

## 2011-11-07 ENCOUNTER — Ambulatory Visit (HOSPITAL_COMMUNITY): Admission: RE | Admit: 2011-11-07 | Payer: PRIVATE HEALTH INSURANCE | Source: Ambulatory Visit | Admitting: Surgery

## 2011-11-07 ENCOUNTER — Encounter (HOSPITAL_COMMUNITY)
Admission: EM | Disposition: A | Discharge status: Home / Self Care | Payer: Self-pay | Source: Home / Self Care | Attending: Internal Medicine

## 2011-11-07 DIAGNOSIS — D72829 Elevated white blood cell count, unspecified: Secondary | ICD-10-CM

## 2011-11-07 DIAGNOSIS — R110 Nausea: Secondary | ICD-10-CM

## 2011-11-07 DIAGNOSIS — K220 Achalasia of cardia: Secondary | ICD-10-CM

## 2011-11-07 HISTORY — PX: HELLER MYOTOMY: SHX5259

## 2011-11-07 LAB — CBC
HCT: 29.8 % — ABNORMAL LOW (ref 36.0–46.0)
Hemoglobin: 9.6 g/dL — ABNORMAL LOW (ref 12.0–15.0)
MCH: 20.6 pg — ABNORMAL LOW (ref 26.0–34.0)
MCHC: 32.2 g/dL (ref 30.0–36.0)
MCV: 64.1 fL — ABNORMAL LOW (ref 78.0–100.0)
Platelets: 403 10*3/uL — ABNORMAL HIGH (ref 150–400)
RBC: 4.65 MIL/uL (ref 3.87–5.11)
RDW: 17.1 % — ABNORMAL HIGH (ref 11.5–15.5)
WBC: 17.9 10*3/uL — ABNORMAL HIGH (ref 4.0–10.5)

## 2011-11-07 LAB — CREATININE, SERUM
Creatinine, Ser: 0.85 mg/dL (ref 0.50–1.10)
GFR calc Af Amer: >90 mL/min (ref >90)
GFR calc non Af Amer: 84 mL/min — ABNORMAL LOW (ref >90)

## 2011-11-07 LAB — SURGICAL PCR SCREEN
MRSA, PCR: NEGATIVE
Staphylococcus aureus: NEGATIVE

## 2011-11-07 SURGERY — ESOPHAGOMYOTOMY, LAPAROSCOPIC, HELLER
Anesthesia: General | Site: Esophagus

## 2011-11-07 MED ORDER — PROPOFOL 10 MG/ML IV BOLUS
INTRAVENOUS | Status: DC | PRN
  Administered 2011-11-07: 150 mg via INTRAVENOUS

## 2011-11-07 MED ORDER — FENTANYL CITRATE 0.05 MG/ML IJ SOLN
INTRAMUSCULAR | Status: DC | PRN
  Administered 2011-11-07: 50 ug via INTRAVENOUS
  Administered 2011-11-07: 100 ug via INTRAVENOUS
  Administered 2011-11-07 (×5): 50 ug via INTRAVENOUS
  Administered 2011-11-07: 100 ug via INTRAVENOUS

## 2011-11-07 MED ORDER — PROMETHAZINE HCL 25 MG/ML IJ SOLN: 6.2500 mg | INTRAMUSCULAR | Status: DC | PRN

## 2011-11-07 MED ORDER — DEXTROSE 5 % IV SOLN: 2.0000 g | INTRAVENOUS | Status: DC

## 2011-11-07 MED ORDER — CEFOXITIN SODIUM-DEXTROSE 1-4 GM-% IV SOLR (PREMIX)
INTRAVENOUS | Status: AC
  Filled 2011-11-07: qty 100

## 2011-11-07 MED ORDER — ACETAMINOPHEN 10 MG/ML IV SOLN
INTRAVENOUS | Status: AC
  Filled 2011-11-07: qty 100

## 2011-11-07 MED ORDER — HYDROMORPHONE HCL PF 1 MG/ML IJ SOLN: 0.2500 mg | INTRAMUSCULAR | Status: DC | PRN

## 2011-11-07 MED ORDER — LACTATED RINGERS IV SOLN
INTRAVENOUS | Status: DC | PRN
  Administered 2011-11-07: 08:00:00 via INTRAVENOUS

## 2011-11-07 MED ORDER — MEPERIDINE HCL 50 MG/ML IJ SOLN: 6.2500 mg | INTRAMUSCULAR | Status: DC | PRN

## 2011-11-07 MED ORDER — DROPERIDOL 2.5 MG/ML IJ SOLN
INTRAMUSCULAR | Status: DC | PRN
  Administered 2011-11-07: 0.625 mg via INTRAVENOUS

## 2011-11-07 MED ORDER — HEPARIN SODIUM (PORCINE) 5000 UNIT/ML IJ SOLN: 5000.0000 [IU] | Freq: Three times a day (TID) | INTRAMUSCULAR | Status: DC

## 2011-11-07 MED ORDER — BUPIVACAINE LIPOSOME 1.3 % IJ SUSP
20.0000 mL | Freq: Once | INTRAMUSCULAR | Status: AC
  Administered 2011-11-07: 20 mL
  Filled 2011-11-07: qty 20

## 2011-11-07 MED ORDER — ROCURONIUM BROMIDE 100 MG/10ML IV SOLN
INTRAVENOUS | Status: DC | PRN
  Administered 2011-11-07: 20 mg via INTRAVENOUS
  Administered 2011-11-07: 50 mg via INTRAVENOUS
  Administered 2011-11-07 (×2): 10 mg via INTRAVENOUS
  Administered 2011-11-07: 20 mg via INTRAVENOUS
  Administered 2011-11-07: 10 mg via INTRAVENOUS

## 2011-11-07 MED ORDER — NEOSTIGMINE METHYLSULFATE 1 MG/ML IJ SOLN
INTRAMUSCULAR | Status: DC | PRN
  Administered 2011-11-07: 5 mg via INTRAVENOUS

## 2011-11-07 MED ORDER — MIDAZOLAM HCL 5 MG/5ML IJ SOLN
INTRAMUSCULAR | Status: DC | PRN
  Administered 2011-11-07 (×2): 0.5 mg via INTRAVENOUS
  Administered 2011-11-07: 1 mg via INTRAVENOUS

## 2011-11-07 MED ORDER — LACTATED RINGERS IV SOLN: INTRAVENOUS | Status: DC

## 2011-11-07 MED ORDER — LIDOCAINE HCL (CARDIAC) 20 MG/ML IV SOLN
INTRAVENOUS | Status: DC | PRN
  Administered 2011-11-07: 40 mg via INTRAVENOUS

## 2011-11-07 MED ORDER — HEPARIN SODIUM (PORCINE) 5000 UNIT/ML IJ SOLN: 5000.0000 [IU] | Freq: Once | INTRAMUSCULAR | Status: DC

## 2011-11-07 MED ORDER — HYDROMORPHONE HCL PF 1 MG/ML IJ SOLN
1.0000 mg | INTRAMUSCULAR | Status: DC | PRN
  Administered 2011-11-07 – 2011-11-09 (×9): 1 mg via INTRAVENOUS
  Filled 2011-11-07 (×9): qty 1

## 2011-11-07 MED ORDER — DEXAMETHASONE SODIUM PHOSPHATE 10 MG/ML IJ SOLN
INTRAMUSCULAR | Status: DC | PRN
  Administered 2011-11-07: 10 mg via INTRAVENOUS

## 2011-11-07 MED ORDER — GLYCOPYRROLATE 0.2 MG/ML IJ SOLN
INTRAMUSCULAR | Status: DC | PRN
  Administered 2011-11-07: .6 mg via INTRAVENOUS

## 2011-11-07 MED ORDER — KCL IN DEXTROSE-NACL 20-5-0.45 MEQ/L-%-% IV SOLN
INTRAVENOUS | Status: DC
  Administered 2011-11-07 – 2011-11-08 (×5): via INTRAVENOUS
  Filled 2011-11-07 (×8): qty 1000

## 2011-11-07 MED ORDER — METOCLOPRAMIDE HCL 5 MG/ML IJ SOLN
INTRAMUSCULAR | Status: DC | PRN
  Administered 2011-11-07: 10 mg via INTRAVENOUS

## 2011-11-07 MED ORDER — ONDANSETRON HCL 4 MG/2ML IJ SOLN
INTRAMUSCULAR | Status: DC | PRN
  Administered 2011-11-07: 4 mg via INTRAVENOUS

## 2011-11-07 MED ORDER — HYDROMORPHONE HCL PF 1 MG/ML IJ SOLN
INTRAMUSCULAR | Status: DC | PRN
  Administered 2011-11-07: 0.5 mg via INTRAVENOUS
  Administered 2011-11-07: 1 mg via INTRAVENOUS
  Administered 2011-11-07: 0.5 mg via INTRAVENOUS

## 2011-11-07 MED ORDER — CHLORHEXIDINE GLUCONATE 4 % EX LIQD: 1.0000 "application " | Freq: Once | CUTANEOUS | Status: DC

## 2011-11-07 MED ORDER — ACETAMINOPHEN 10 MG/ML IV SOLN
INTRAVENOUS | Status: DC | PRN
  Administered 2011-11-07 (×2): 1000 mg via INTRAVENOUS

## 2011-11-07 MED ORDER — KETAMINE HCL 10 MG/ML IJ SOLN
INTRAMUSCULAR | Status: DC | PRN
  Administered 2011-11-07: 1 mg via INTRAVENOUS
  Administered 2011-11-07: 4 mg via INTRAVENOUS
  Administered 2011-11-07: 5 mg via INTRAVENOUS
  Administered 2011-11-07 (×2): 10 mg via INTRAVENOUS
  Administered 2011-11-07: 2.5 mg via INTRAVENOUS
  Administered 2011-11-07: 5 mg via INTRAVENOUS
  Administered 2011-11-07 (×2): 10 mg via INTRAVENOUS
  Administered 2011-11-07: 2.5 mg via INTRAVENOUS
  Administered 2011-11-07: 10 mg via INTRAVENOUS

## 2011-11-07 MED ORDER — LACTATED RINGERS IV SOLN
INTRAVENOUS | Status: DC | PRN
  Administered 2011-11-07 (×2): via INTRAVENOUS

## 2011-11-07 MED ORDER — DEXTROSE 5 % IV SOLN
1.0000 g | INTRAVENOUS | Status: DC | PRN
  Administered 2011-11-07: 2 g via INTRAVENOUS

## 2011-11-07 SURGICAL SUPPLY — 42 items
APL SKNCLS STERI-STRIP NONHPOA (GAUZE/BANDAGES/DRESSINGS) ×2
APPLIER CLIP 5 13 M/L LIGAMAX5 (MISCELLANEOUS) ×3
APR CLP MED LRG 5 ANG JAW (MISCELLANEOUS) ×2
BENZOIN TINCTURE PRP APPL 2/3 (GAUZE/BANDAGES/DRESSINGS) ×3 IMPLANT
BLADE HEX COATED 2.75 (ELECTRODE) IMPLANT
CANISTER SUCTION 2500CC (MISCELLANEOUS) ×3 IMPLANT
CANNULA ENDOPATH XCEL 11M (ENDOMECHANICALS) ×9 IMPLANT
CLAMP ENDO BABCK 10MM (STAPLE) ×5 IMPLANT
CLIP APPLIE 5 13 M/L LIGAMAX5 (MISCELLANEOUS) IMPLANT
CLOTH BEACON ORANGE TIMEOUT ST (SAFETY) ×3 IMPLANT
COVER SURGICAL LIGHT HANDLE (MISCELLANEOUS) ×3 IMPLANT
DECANTER SPIKE VIAL GLASS SM (MISCELLANEOUS) ×3 IMPLANT
DEVICE SUTURE ENDOST 10MM (ENDOMECHANICALS) IMPLANT
DISSECTOR BLUNT TIP ENDO 5MM (MISCELLANEOUS) ×3 IMPLANT
DRAIN PENROSE 18X1/2 LTX STRL (DRAIN) ×3 IMPLANT
DRAPE LAPAROSCOPIC ABDOMINAL (DRAPES) ×3 IMPLANT
ELECT REM PT RETURN 9FT ADLT (ELECTROSURGICAL) ×3
ELECTRODE REM PT RTRN 9FT ADLT (ELECTROSURGICAL) ×2 IMPLANT
GLOVE BIOGEL M 8.0 STRL (GLOVE) ×3 IMPLANT
GLOVE BIOGEL PI IND STRL 7.0 (GLOVE) ×2 IMPLANT
GLOVE BIOGEL PI INDICATOR 7.0 (GLOVE) ×2
GOWN STRL NON-REIN LRG LVL3 (GOWN DISPOSABLE) ×4 IMPLANT
GOWN STRL REIN XL XLG (GOWN DISPOSABLE) ×7 IMPLANT
HAND ACTIVATED (MISCELLANEOUS) ×3 IMPLANT
KIT BASIN OR (CUSTOM PROCEDURE TRAY) ×3 IMPLANT
NS IRRIG 1000ML POUR BTL (IV SOLUTION) ×3 IMPLANT
PENCIL BUTTON HOLSTER BLD 10FT (ELECTRODE) IMPLANT
SCISSORS LAP 5X35 DISP (ENDOMECHANICALS) ×3 IMPLANT
SET IRRIG TUBING LAPAROSCOPIC (IRRIGATION / IRRIGATOR) ×3 IMPLANT
SLEEVE ADV FIXATION 5X100MM (TROCAR) ×1 IMPLANT
SOLUTION ANTI FOG 6CC (MISCELLANEOUS) ×3 IMPLANT
SUT ETHIBOND 2 0 SH (SUTURE)
SUT ETHIBOND 2 0 SH 36X2 (SUTURE) IMPLANT
SUT SURGIDAC NAB ES-9 0 48 120 (SUTURE) IMPLANT
SUT VIC AB 4-0 SH 18 (SUTURE) ×3 IMPLANT
SYR 30ML LL (SYRINGE) ×3 IMPLANT
TOWEL OR 17X26 10 PK STRL BLUE (TOWEL DISPOSABLE) ×3 IMPLANT
TRAY FOLEY CATH 14FRSI W/METER (CATHETERS) ×3 IMPLANT
TRAY LAP CHOLE (CUSTOM PROCEDURE TRAY) ×3 IMPLANT
TROCAR BLADELESS OPT 5 75 (ENDOMECHANICALS) ×3 IMPLANT
TROCAR XCEL NON-BLD 11X100MML (ENDOMECHANICALS) ×3 IMPLANT
TUBING FILTER THERMOFLATOR (ELECTROSURGICAL) ×3 IMPLANT

## 2011-11-07 NOTE — Progress Notes (Signed)
Patient ID: [name], female   DOB: [birth year], 41 y.o.   MRN: [id number] I sat down with [name] and  her mother this morning and reviewed her upper GI series and described the laparoscopic/open esophago-gastric myotomy that we will perform this morning. I also mentioned we may need to put a chest tube in her. She is aware of the risk of perforation just as she has had with her endoscopies.  Also described the absence of peristalsis that was seen on the upper GI and how this could adversely effect her outcome.  There was an at the problem yesterday and that I entered her preoperative orders with a subsequent system wide Epic failure. These problems had been resolved.  Discussed her endoscopy yesterday with Dr. Bosie Clos he was able to lavage her esophagus and removed some old food stuffs. Will likely repeat endoscopy today and guiding the myotomy.

## 2011-11-07 NOTE — Anesthesia Preprocedure Evaluation (Signed)
Anesthesia Evaluation  Patient identified by MRN, date of birth, ID band Patient awake    Reviewed: Allergy & Precautions, H&P , NPO status , Patient's Chart, lab work & pertinent test results  Airway Mallampati: II TM Distance: >3 FB Neck ROM: Full    Dental No notable dental hx.    Pulmonary neg pulmonary ROS,  clear to auscultation  Pulmonary exam normal       Cardiovascular neg cardio ROS - Valvular Problems/MurmursRegular Normal    Neuro/Psych Negative Neurological ROS  Negative Psych ROS   GI/Hepatic negative GI ROS, Neg liver ROS, GERD-  ,  Endo/Other  Negative Endocrine ROS  Renal/GU negative Renal ROS  Genitourinary negative   Musculoskeletal negative musculoskeletal ROS (+)   Abdominal   Peds negative pediatric ROS (+)  Hematology negative hematology ROS (+)   Anesthesia Other Findings   Reproductive/Obstetrics negative OB ROS                           Anesthesia Physical Anesthesia Plan  ASA: II  Anesthesia Plan: General   Post-op Pain Management:    Induction: Intravenous  Airway Management Planned: Oral ETT  Additional Equipment:   Intra-op Plan:   Post-operative Plan: Extubation in OR  Informed Consent: I have reviewed the patients History and Physical, chart, labs and discussed the procedure including the risks, benefits and alternatives for the proposed anesthesia with the patient or authorized representative who has indicated his/her understanding and acceptance.   Dental advisory given  Plan Discussed with: CRNA  Anesthesia Plan Comments:         Anesthesia Quick Evaluation

## 2011-11-07 NOTE — Progress Notes (Signed)
Patient discussed at the Long Length of Stay [name] Weeks 11/07/2011  

## 2011-11-07 NOTE — Anesthesia Postprocedure Evaluation (Signed)
  Anesthesia Post-op Note  Patient: [name]  Procedure(s) Performed: Procedure(s) (LRB): LAPAROSCOPIC HELLER MYOTOMY (N/A) UPPER GI ENDOSCOPY (N/A)  Patient Location: PACU  Anesthesia Type: General  Level of Consciousness: awake and alert   Airway and Oxygen Therapy: Patient Spontanous Breathing  Post-op Pain: mild  Post-op Assessment: Post-op Vital signs reviewed, Patient's Cardiovascular Status Stable, Respiratory Function Stable, Patent Airway and No signs of Nausea or vomiting  Post-op Vital Signs: stable  Complications: No apparent anesthesia complications

## 2011-11-07 NOTE — Preoperative (Signed)
Beta Blockers   Reason not to administer Beta Blockers:Not Applicable, pt not on home BB 

## 2011-11-07 NOTE — Anesthesia Procedure Notes (Signed)
Procedure Name: MAC Date/Time: 11/07/2011 8:42 AM Performed by: Randon Goldsmith CATHERINE PAYNE Pre-anesthesia Checklist: Patient identified, Emergency Drugs available, Suction available and Patient being monitored Patient Re-evaluated:Patient Re-evaluated prior to inductionOxygen Delivery Method: Circle system utilized Preoxygenation: Pre-oxygenation with 100% oxygen Intubation Type: IV induction Ventilation: Mask ventilation without difficulty Laryngoscope Size: Mac and 3 Tube type: Oral Tube size: 7.5 mm Number of attempts: 1 Placement Confirmation: ETT inserted through vocal cords under direct vision,  positive ETCO2,  CO2 detector and breath sounds checked- equal and bilateral Secured at: 21 cm Tube secured with: Tape Dental Injury: Teeth and Oropharynx as per pre-operative assessment

## 2011-11-07 NOTE — Transfer of Care (Signed)
Immediate Anesthesia Transfer of Care Note  Patient: [name]  Procedure(s) Performed: Procedure(s) (LRB): LAPAROSCOPIC HELLER MYOTOMY (N/A) UPPER GI ENDOSCOPY (N/A)  Patient Location: PACU  Anesthesia Type: General  Level of Consciousness: awake, alert , oriented, patient cooperative and responds to stimulation  Airway & Oxygen Therapy: Patient Spontanous Breathing and Patient connected to face mask oxygen  Post-op Assessment: Report given to PACU RN, Post -op Vital signs reviewed and stable and Patient moving all extremities  Post vital signs: Reviewed and stable  Complications: No apparent anesthesia complications

## 2011-11-07 NOTE — Op Note (Signed)
Surgeon: Wenda Low, MD, FACS  Asst:  Jaclynn Guarneri, M.D., FACS  Anes:  General  Procedure: Laparoscopic redo heller esophagogastric myotomy, endoscopy  Diagnosis: Prior transthoracic Heller monoamine done 20 years ago with recurrent achalasia  Complications: None noted laparoscopically or endoscopically  EBL:   Minimal cc  Description of Procedure:  The patient was taken to or 1 on Wednesday, 11/07/2011 anesthesia was administered and the abdomen was prepped with PCMX and draped sterilely. A timeout was performed. Access to the abdomen was achieved to the left upper quadrant using a 0 5 mm Optiview without difficulty. The abdomen was insufflated and a total of 6 trocar holes were made all fives except for one 12 more on the right.  Section of the foregut began by incising the gastrohepatic window and in taking down investments of the distal esophagus and the crura. On the left side this was extremely stuck as this is a side of her previous transthoracic Heller myotomy. Careful tedious dissection to tease the esophagus away from the crura and had good crural exposure in the abdomen beneath the esophagus at that point to pass a Penrose drain. With the drain keeping the esophagus on some stretch I the went up into the mediastinum and free the esophagus up to where I thought there was likely a dilatation noted. This was where I thought the transition was located.   I then began the myotomy down distally near the EG junction dissecting through the longitudinal fibers to identify the transverse fibers which had been carefully picked through using manual dissection with the hook electrocautery with some cautery of the muscle. When doing this I tried to be pulling away from the mucosa. It did not appear to have any mucosal burns. No perforation noted. This was checked on multiple occasions when endoscopically. After extending the myotomy up I then went up top and pass the endoscope into the distal  esophagus. She still had a fair amount of food material there. I could then see that the light through the myotomy and it was opened down to the GE junction. At the GE junction I could see that that muscle stricture was still present. I then went back in the abdomen and carried the myotomy down onto the stomach for a distance of about 2 cm a going through the crown of Helvisius.  Total myotomy length was 6-7 cm.  This was essentially then a continuation of the esophageal myotomy onto the stomach and when completed I then revisited the area with the endoscope and went all the way into the stomach at more importantly visualize the EG junction from the esophagus and I could see through there there was a nice patent opening into the stomach we insufflated no bubbles were seen in the abdomen. The mucosa looked viable and healthy. Also it  appeared that way endoscopically. At that point we felt of the operation was completed.  I decided to not perform a Dor plication in this redo procedure. Trocar sites were injected with Exparel. The wounds were closed with 4-0 Vicryl and Dermabond. It was taken recovery room in satisfactory condition.  Matt B. Daphine Deutscher, MD, [facility], [place] [phone]

## 2011-11-08 ENCOUNTER — Inpatient Hospital Stay (HOSPITAL_COMMUNITY): Payer: PRIVATE HEALTH INSURANCE

## 2011-11-08 LAB — BASIC METABOLIC PANEL
BUN: 7 mg/dL (ref 6–23)
CO2: 25 mEq/L (ref 19–32)
Calcium: 8.8 mg/dL (ref 8.4–10.5)
Chloride: 104 mEq/L (ref 96–112)
Creatinine, Ser: 0.77 mg/dL (ref 0.50–1.10)
GFR calc Af Amer: >90 mL/min (ref >90)
GFR calc non Af Amer: >90 mL/min (ref >90)
Glucose, Bld: 127 mg/dL — ABNORMAL HIGH (ref 70–99)
Potassium: 3.9 mEq/L (ref 3.5–5.1)
Sodium: 137 mEq/L (ref 135–145)

## 2011-11-08 LAB — CBC
HCT: 26.8 % — ABNORMAL LOW (ref 36.0–46.0)
Hemoglobin: 8.6 g/dL — ABNORMAL LOW (ref 12.0–15.0)
MCH: 20.5 pg — ABNORMAL LOW (ref 26.0–34.0)
MCHC: 32.1 g/dL (ref 30.0–36.0)
MCV: 63.8 fL — ABNORMAL LOW (ref 78.0–100.0)
Platelets: 407 10*3/uL — ABNORMAL HIGH (ref 150–400)
RBC: 4.2 MIL/uL (ref 3.87–5.11)
RDW: 17.3 % — ABNORMAL HIGH (ref 11.5–15.5)
WBC: 17 10*3/uL — ABNORMAL HIGH (ref 4.0–10.5)

## 2011-11-08 MED ORDER — IOHEXOL 300 MG/ML  SOLN
50.0000 mL | Freq: Once | INTRAMUSCULAR | Status: AC | PRN
  Administered 2011-11-08: 50 mL via ORAL

## 2011-11-08 NOTE — Progress Notes (Signed)
Patient ID: [name], female   DOB: [birth year], 41 y.o.   MRN: [id number] [facility] Progress Note:   1 Day Post-Op  Subjective: Mental status is clear.  Incisions sore Objective: Vital signs in last 24 hours: Temp:  [97.4 F (36.3 C)-98.4 F (36.9 C)] 97.9 F (36.6 C) (02/28 0541) Pulse Rate:  [70-86] 70  (02/28 0541) Resp:  [15-18] 18  (02/28 0541) BP: (117-132)/(72-84) 117/73 mmHg (02/28 0541) SpO2:  [92 %-100 %] 100 % (02/28 0541)  Intake/Output from previous day: 02/27 0701 - 02/28 0700 In: 4606.7 [I.V.:4606.7] Out: 1890 [Urine:1790; Blood:100] Intake/Output this shift:    Physical Exam: Work of breathing is  normal  Lab Results:  Results for orders placed during the hospital encounter of 10/27/11 (from the past 48 hour(s))  SURGICAL PCR SCREEN     Status: Normal   Collection Time   11/07/11  7:39 AM      Component Value Range Comment   MRSA, PCR NEGATIVE  NEGATIVE     Staphylococcus aureus NEGATIVE  NEGATIVE    CBC     Status: Abnormal   Collection Time   11/07/11  2:42 PM      Component Value Range Comment   WBC 17.9 (*) 4.0 - 10.5 (K/uL)    RBC 4.65  3.87 - 5.11 (MIL/uL)    Hemoglobin 9.6 (*) 12.0 - 15.0 (g/dL)    HCT 78.2 (*) 95.6 - 46.0 (%)    MCV 64.1 (*) 78.0 - 100.0 (fL)    MCH 20.6 (*) 26.0 - 34.0 (pg)    MCHC 32.2  30.0 - 36.0 (g/dL)    RDW 21.3 (*) 08.6 - 15.5 (%)    Platelets 403 (*) 150 - 400 (K/uL)   CREATININE, SERUM     Status: Abnormal   Collection Time   11/07/11  2:42 PM      Component Value Range Comment   Creatinine, Ser 0.85  0.50 - 1.10 (mg/dL)    GFR calc non Af Amer 84 (*) >90 (mL/min)    GFR calc Af Amer >90  >90 (mL/min)   CBC     Status: Abnormal   Collection Time   11/08/11  3:45 AM      Component Value Range Comment   WBC 17.0 (*) 4.0 - 10.5 (K/uL)    RBC 4.20  3.87 - 5.11 (MIL/uL)    Hemoglobin 8.6 (*) 12.0 - 15.0 (g/dL)    HCT 57.8 (*) 46.9 - 46.0 (%)    MCV 63.8 (*) 78.0 - 100.0 (fL)    MCH 20.5 (*) 26.0 -  34.0 (pg)    MCHC 32.1  30.0 - 36.0 (g/dL)    RDW 62.9 (*) 52.8 - 15.5 (%)    Platelets 407 (*) 150 - 400 (K/uL)   BASIC METABOLIC PANEL     Status: Abnormal   Collection Time   11/08/11  3:45 AM      Component Value Range Comment   Sodium 137  135 - 145 (mEq/L)    Potassium 3.9  3.5 - 5.1 (mEq/L)    Chloride 104  96 - 112 (mEq/L)    CO2 25  19 - 32 (mEq/L)    Glucose, Bld 127 (*) 70 - 99 (mg/dL)    BUN 7  6 - 23 (mg/dL)    Creatinine, Ser 4.13  0.50 - 1.10 (mg/dL)    Calcium 8.8  8.4 - 10.5 (mg/dL)    GFR calc non Af Amer >90  >  90 (mL/min)    GFR calc Af Amer >90  >90 (mL/min)     Radiology/Results: No results found.  Anti-infectives: Anti-infectives     Start     Dose/Rate Route Frequency Ordered Stop   11/07/11 1415   cefOXitin (MEFOXIN) 2 g in dextrose 5 % 50 mL IVPB  Status:  Discontinued        2 g 100 mL/hr over 30 Minutes Intravenous 60 min pre-op 11/07/11 1408 11/07/11 1414   11/07/11 1408   cefOXitin (MEFOXIN) 2 g in dextrose 5 % 50 mL IVPB  Status:  Discontinued        2 g 100 mL/hr over 30 Minutes Intravenous 60 min pre-op 11/07/11 1408 11/07/11 1419   10/28/11 0800   vancomycin (VANCOCIN) IVPB 1000 mg/200 mL premix  Status:  Discontinued        1,000 mg 200 mL/hr over 60 Minutes Intravenous Every 8 hours 10/28/11 0604 10/31/11 1634   10/28/11 0800   piperacillin-tazobactam (ZOSYN) IVPB 3.375 g  Status:  Discontinued        3.375 g 12.5 mL/hr over 240 Minutes Intravenous Every 8 hours 10/28/11 0604 11/02/11 1355   10/28/11 0700   Levofloxacin (LEVAQUIN) IVPB 750 mg  Status:  Discontinued        750 mg 100 mL/hr over 90 Minutes Intravenous Every 24 hours 10/28/11 0603 11/04/11 1440          Assessment/Plan: Problem List: Patient Active Problem List  Diagnoses  . Achalasia  . Leukocytosis  . Thrombocytosis  . Pericardial effusion  . Pleural effusion, left  . Microcytic anemia  . Tachycardia  . SIRS (systemic inflammatory response syndrome)  .  Aspiration pneumonia  . D-dimer, elevated    Awaiting gastrograffin swallow.  If OK will start clears PO 1 Day Post-Op    LOS: 12 days   Matt B. Daphine Deutscher, MD, [facility] [phone] beeper [phone]  11/08/2011 8:35 AM

## 2011-11-08 NOTE — Progress Notes (Signed)
Patient requesting foley to be removed so that she feels comfortable ambulating in hallway.  MD's office notified; PA okay'ed (no current orders for foley placement/removal).  Foley d/c'ed at 14:55 and patient tolerated the procedure well.  Will continue to monitor.

## 2011-11-09 ENCOUNTER — Encounter (HOSPITAL_COMMUNITY): Payer: Self-pay | Admitting: Surgery

## 2011-11-09 NOTE — Discharge Instructions (Signed)
Achalasia Achalasia is a condition in which a person cannot get food through the lower esophagus. This is the tube that carries food from your mouth to your stomach. This happens because there are no nerves to the lower esophagus and the esophageal sphincter. This is the circular muscle between the stomach and esophagus that relaxes to allow food into the stomach. It then contracts to keep food in the stomach. This absence of nerves may be present since birth. This condition causes difficulty swallowing, chest pain, and food coming back up in the mouth after being swallowed. DIAGNOSIS  This condition is diagnosed by x-ray, pressure studies in the esophagus, and endoscopy. This is when your caregiver looks into your esophagus with a small flexible telescope. The portion of the esophagus above the narrowing is usually enlarged when the condition is present. TREATMENT   Soft diets are helpful.   Medications will help the food pass more easily into the stomach.   If conservative treatment (as above) does not work, stretching the end of the esophagus with a balloon may help.   Sometimes surgical treatment is used and a segment of esophagus is removed.  SEEK IMMEDIATE MEDICAL CARE IF:  You are unable to keep fluids down or it feels as though food sticks in your chest area.   Vomiting becomes persistent.   Chest or belly pain develops, increases, or localizes.   You have a fever.  If problems are continuing and not allowing you to live a normal lifestyle, talk with your caregiver and discuss medical means to help this. Document Released: 06/06/2005 Document Revised: 05/09/2011 Document Reviewed: 09/19/2006 [facility] Patient Information 2012 [place], [place].

## 2011-11-09 NOTE — Discharge Summary (Signed)
Physician Discharge Summary  Patient ID: DEAUNNA OLARTE MRN: [id number] DOB/AGE: 1971/02/24 41 y.o.  Admit date: 10/27/2011 Discharge date: 11/09/2011  Admission Diagnoses:  Mediastinitis and aspiration pneumonia complicating achalasia  Discharge Diagnoses:  same  Principal Problem:  *Aspiration pneumonia Active Problems:  Achalasia  Leukocytosis  Thrombocytosis  Pleural effusion, left  Microcytic anemia  D-dimer, elevated   Surgery:  Redo laparoscopic Heller myotomy  Discharged Condition: improved  Hospital Course:   The patient was admitted and treated for pneumonia with antibiotics.  Chest xray showed a left pleural effusion which was tapped twice.  Was taken to OR on 2/27 and underwent laparaoscopic Heller myotomy (prior left transthoracic Heller myotomy 20 years ago  Consults: hospitalists  Significant Diagnostic Studies: UGI and CT    Discharge Exam: Blood pressure 129/83, pulse 84, temperature 98.8 F (37.1 C), temperature source Oral, resp. rate 20, height 6' 0.05" (1.83 m), weight 202 lb 2.6 oz (91.7 kg), last menstrual period 10/18/2011, SpO2 93.00%. Minimal abdominal pain  Disposition: 01-Home or Self Care  Discharge Orders    Future Orders Please Complete By Expires   Diet - low sodium heart healthy      Scheduling Instructions:   Clear liquids   Increase activity slowly      Discharge instructions      Comments:   Shower and shampoo as needed Stay on clear liquids for 1 week and then advance to pureed diet   No wound care      Scheduling Instructions:   May shower and get wet   Call MD for:  temperature >100.4      Call MD for:  persistant nausea and vomiting      Call MD for:  severe uncontrolled pain        Medication List  As of 11/09/2011  8:54 AM   STOP taking these medications         amoxicillin-clavulanate 400-57 MG/5ML suspension      LORazepam 2 MG/ML concentrated solution         TAKE these medications        HYDROcodone-homatropine 5-1.5 MG/5ML syrup   Commonly known as: HYCODAN   Take 5 mLs by mouth every 6 (six) hours as needed. For pain management           Follow-up Information    Follow up with Kareema Keitt B, MD. Schedule an appointment as soon as possible for a visit in 2 weeks.   Contact information:   [employer] [employer] [address], [address] [place] [address] [phone]          Signed: Valarie Merino 11/09/2011, 8:54 AM

## 2011-11-12 ENCOUNTER — Ambulatory Visit (HOSPITAL_COMMUNITY)
Admission: RE | Admit: 2011-11-12 | Discharge: 2011-11-12 | Disposition: A | Discharge status: Home / Self Care | Payer: PRIVATE HEALTH INSURANCE | Source: Ambulatory Visit | Attending: Surgery | Admitting: Surgery

## 2011-11-12 ENCOUNTER — Telehealth (INDEPENDENT_AMBULATORY_CARE_PROVIDER_SITE_OTHER): Payer: Self-pay

## 2011-11-12 DIAGNOSIS — K228 Other specified diseases of esophagus: Secondary | ICD-10-CM | POA: Insufficient documentation

## 2011-11-12 DIAGNOSIS — R5381 Other malaise: Secondary | ICD-10-CM | POA: Insufficient documentation

## 2011-11-12 DIAGNOSIS — K2289 Other specified disease of esophagus: Secondary | ICD-10-CM | POA: Insufficient documentation

## 2011-11-12 DIAGNOSIS — J90 Pleural effusion, not elsewhere classified: Secondary | ICD-10-CM | POA: Insufficient documentation

## 2011-11-12 DIAGNOSIS — J984 Other disorders of lung: Secondary | ICD-10-CM | POA: Insufficient documentation

## 2011-11-12 DIAGNOSIS — R0602 Shortness of breath: Secondary | ICD-10-CM

## 2011-11-12 NOTE — Telephone Encounter (Signed)
Addended by: Latricia Heft on: 11/12/2011 11:57 AM   Modules accepted: Orders

## 2011-11-12 NOTE — Telephone Encounter (Signed)
The patient called and requested to speak to Dr Ermalene Searing nurse.  She said it's not urgent but to call her back.

## 2011-11-12 NOTE — Telephone Encounter (Signed)
Contacted the patient per her request she states that she is out of breath upon exertion for example taking a shower. States Dr Daphine Deutscher told her to follow up with a chest xray. Paged him to see if this is the avenue he would like to take

## 2011-11-12 NOTE — Telephone Encounter (Signed)
Per Dr Daphine Deutscher, pt to have Chest xray PA & LA, order placed, notified the patient.

## 2011-11-22 ENCOUNTER — Encounter (INDEPENDENT_AMBULATORY_CARE_PROVIDER_SITE_OTHER): Payer: PRIVATE HEALTH INSURANCE | Admitting: Surgery

## 2011-11-26 ENCOUNTER — Emergency Department (HOSPITAL_COMMUNITY): Payer: PRIVATE HEALTH INSURANCE

## 2011-11-26 ENCOUNTER — Encounter (HOSPITAL_COMMUNITY): Payer: Self-pay | Admitting: *Deleted

## 2011-11-26 ENCOUNTER — Emergency Department (HOSPITAL_COMMUNITY)
Admission: EM | Admit: 2011-11-26 | Discharge: 2011-11-26 | Disposition: A | Discharge status: Home / Self Care | Payer: PRIVATE HEALTH INSURANCE | Attending: Emergency Medicine | Admitting: Emergency Medicine

## 2011-11-26 ENCOUNTER — Telehealth (INDEPENDENT_AMBULATORY_CARE_PROVIDER_SITE_OTHER): Payer: Self-pay | Admitting: General Surgery

## 2011-11-26 ENCOUNTER — Other Ambulatory Visit: Payer: Self-pay

## 2011-11-26 DIAGNOSIS — R071 Chest pain on breathing: Secondary | ICD-10-CM | POA: Insufficient documentation

## 2011-11-26 DIAGNOSIS — K219 Gastro-esophageal reflux disease without esophagitis: Secondary | ICD-10-CM | POA: Insufficient documentation

## 2011-11-26 DIAGNOSIS — I319 Disease of pericardium, unspecified: Secondary | ICD-10-CM | POA: Insufficient documentation

## 2011-11-26 DIAGNOSIS — R791 Abnormal coagulation profile: Secondary | ICD-10-CM | POA: Insufficient documentation

## 2011-11-26 DIAGNOSIS — J90 Pleural effusion, not elsewhere classified: Secondary | ICD-10-CM | POA: Insufficient documentation

## 2011-11-26 LAB — CBC
HCT: 31.8 % — ABNORMAL LOW (ref 36.0–46.0)
Hemoglobin: 10.3 g/dL — ABNORMAL LOW (ref 12.0–15.0)
MCH: 20.6 pg — ABNORMAL LOW (ref 26.0–34.0)
MCHC: 32.4 g/dL (ref 30.0–36.0)
MCV: 63.7 fL — ABNORMAL LOW (ref 78.0–100.0)
Platelets: 261 10*3/uL (ref 150–400)
RBC: 4.99 MIL/uL (ref 3.87–5.11)
RDW: 19 % — ABNORMAL HIGH (ref 11.5–15.5)
WBC: 12.5 10*3/uL — ABNORMAL HIGH (ref 4.0–10.5)

## 2011-11-26 LAB — DIFFERENTIAL
Basophils Absolute: 0 10*3/uL (ref 0.0–0.1)
Basophils Relative: 0 % (ref 0–1)
Eosinophils Absolute: 0.1 10*3/uL (ref 0.0–0.7)
Eosinophils Relative: 1 % (ref 0–5)
Lymphocytes Relative: 11 % — ABNORMAL LOW (ref 12–46)
Lymphs Abs: 1.4 10*3/uL (ref 0.7–4.0)
Monocytes Absolute: 1.1 10*3/uL — ABNORMAL HIGH (ref 0.1–1.0)
Monocytes Relative: 9 % (ref 3–12)
Neutro Abs: 9.9 10*3/uL — ABNORMAL HIGH (ref 1.7–7.7)
Neutrophils Relative %: 79 % — ABNORMAL HIGH (ref 43–77)

## 2011-11-26 LAB — POCT PREGNANCY, URINE: Preg Test, Ur: NEGATIVE

## 2011-11-26 LAB — D-DIMER, QUANTITATIVE: D-Dimer, Quant: 1.6 ug/mL-FEU — ABNORMAL HIGH (ref 0.00–0.48)

## 2011-11-26 LAB — POCT I-STAT TROPONIN I: Troponin i, poc: 0 ng/mL (ref 0.00–0.08)

## 2011-11-26 MED ORDER — IOHEXOL 300 MG/ML  SOLN
100.0000 mL | Freq: Once | INTRAMUSCULAR | Status: AC | PRN
  Administered 2011-11-26: 100 mL via INTRAVENOUS

## 2011-11-26 NOTE — Discharge Instructions (Signed)
Pericarditis Pericarditis is an inflammation of the sac that surrounds the heart. This sac is known as the pericardium. Typically the pericardium contains a smooth lubricating lining. When you have pericarditis, the lining is more like sandpaper. CAUSES   Viral or bacterial infections.   Heart disease.   Heart surgery.   Reaction to a drug.   Kidney failure.   Thyroid problems.   Arthritis.   Cancer.  SYMPTOMS   Sharp chest pain under the breast bone (sternum).   Pain may also be felt in the neck, back or arms.   Pain may feel worse if you lean forward, take a deep breath, or lie down.   Shortness of breath.   Fever.   Pain on swallowing.  DIAGNOSIS  The diagnosis of pericarditis is based on your history,exam findings, and tests. These may include an EKG, chest x-ray, CT studies, blood tests, and echocardiogram.  TREATMENT  Treatment for pericarditis includes medicine for discomfort. An antibiotic may also be needed. With proper treatment, most episodes of pericarditis get better without complications. HOME CARE INSTRUCTIONS   Get plenty of rest.   Avoid activities that increase your pain.   Do not smoke or drink alcohol.   Be sure to see your caregiver as recommended for follow-up to make sure your condition resolves completely.  SEEK IMMEDIATE MEDICAL CARE IF:  You develop pain or shortness of breath that is getting worse.   You develop a high fever and severe abdominal or back pain.   You develop marked weakness, fainting, or any other serious complaint.  Document Released: 10/04/2004 Document Revised: 08/16/2011 Document Reviewed: 11/24/2007 [facility] Patient Information 2012 [place], [place].    Take Tylenol elixir (160 milligrams per 5 milliliter). Take 25 milliliter (5 teaspoons) every 4 hours for pain as needed

## 2011-11-26 NOTE — ED Notes (Signed)
IV team returned call and stated they would try to work her in as soon as possible.

## 2011-11-26 NOTE — ED Notes (Signed)
Pt reports sharp shooting pain "behind my left breast"-started last night, reports pain is worse when taking a deep breath and when moving her L arm up.  Pt denies n/v, SOB.  Pt reports recent surgery done in her esophagus x 2 weeks ago. Started to do laundry last night

## 2011-11-26 NOTE — Telephone Encounter (Signed)
Pt calling in regarding new onset shooting pain behind the Lt breast, especially bad with taking a deep breath.  Denies fever, nausea or vomiting.  Denies knowledge of precipitating reason for onset of pain.  Woke with pain last night.  Took liquid hydrocodone 1 tsp x1 and was able to rest.  Pain persists this morning.  Paged Dr. Daphine Deutscher and updated; recommended pt go to [facility] ER .  Pt was so advised.

## 2011-11-26 NOTE — ED Notes (Signed)
Pt informed an IV would need to be started due to elevated d-dimer. IV team paged for IV start due to 2 unsuccessful attempts by 2 RNs. Awaiting return call.

## 2011-11-26 NOTE — ED Notes (Signed)
Pt returned from CT °

## 2011-11-26 NOTE — ED Notes (Signed)
Pt had refused multiple times to different staff members to be stuck again for blood. She stated that she wanted an IV to be started and blood to be taken off that in case she ended up needing a CT. Finally pt agreed to additional blood draw for d-dimer. Has now been drawn and sent for analysis. EDP made aware.

## 2011-11-26 NOTE — ED Provider Notes (Signed)
History     CSN: [id number]  Arrival date & time 11/26/11  1049   First MD Initiated Contact with Patient 11/26/11 1129      Chief Complaint  Patient presents with  . Chest Pain    (Consider location/radiation/quality/duration/timing/severity/associated sxs/prior treatment) HPI Complains of pleuritic left-sided anterior chest pain nonradiating onset 5 days ago. No shortness of breath. Patient had 2 episodes of sharp chest pain last night lasting 2 minutes each. Treated with hydrocodone last night with partial relief presently discomfort is mild., Worse with deep inspiration and with moving her left arm improved after treatment with hydrocodone. No associated shortness of breath. Patient is status Heller myotomy February 2013 Past Medical History  Diagnosis Date  . GERD (gastroesophageal reflux disease)   . Heart murmur   . Achalasia     S/p Heller myotomy 1991 and numerous dilations    Past Surgical History  Procedure Date  . Achalasia surgery 1991    transthoracic Heller myotomy in [place]  . Foreign body removal 10/08/2011    Procedure: FOREIGN BODY REMOVAL;  Surgeon: Petra Kuba, MD;  Location: [facility] ENDOSCOPY;  Service: Endoscopy;  Laterality: N/A;  botox  needed /ja/magod  . Esophagogastroduodenoscopy 10/08/2011    Procedure: ESOPHAGOGASTRODUODENOSCOPY (EGD);  Surgeon: Petra Kuba, MD;  Location: [facility] ENDOSCOPY;  Service: Endoscopy;  Laterality: N/A;  . Esophagogastroduodenoscopy 10/16/2011    Procedure: ESOPHAGOGASTRODUODENOSCOPY (EGD);  Surgeon: Florencia Reasons, MD;  Location: [facility] ENDOSCOPY;  Service: Endoscopy;  Laterality: N/A;  . Foreign body removal 10/16/2011    Procedure: FOREIGN BODY REMOVAL;  Surgeon: Florencia Reasons, MD;  Location: [facility] ENDOSCOPY;  Service: Endoscopy;  Laterality: N/A;  . Cesarean section     x2  . Esophagogastroduodenoscopy 11/06/2011    Procedure: ESOPHAGOGASTRODUODENOSCOPY (EGD);  Surgeon: Shirley Friar, MD;  Location: [facility] ENDOSCOPY;  Service:  Endoscopy;  Laterality: N/A;  Idell Pickles myotomy 11/07/2011    Procedure: LAPAROSCOPIC HELLER MYOTOMY;  Surgeon: Valarie Merino, MD;  Location: [facility] ORS;  Service: General;  Laterality: N/A;  Laparoscopic Redo Heller Myotomy     Family History  Problem Relation Age of Onset  . Cancer Paternal Grandmother     breast  . Hypertension Mother   . Anesthesia problems Neg Hx   . Hypotension Neg Hx   . Malignant hyperthermia Neg Hx   . Pseudochol deficiency Neg Hx     History  Substance Use Topics  . Smoking status: Former Smoker -- .5 years    Types: Cigarettes    Quit date: 10/07/1990  . Smokeless tobacco: Never Used   Comment: Smoked for 2 months, nothing heavier  . Alcohol Use: No     Doesn't drink but very rarely    OB History    Grav Para Term Preterm Abortions TAB SAB Ect Mult Living                  Review of Systems  Constitutional: Negative.   HENT: Negative.   Respiratory: Negative.   Cardiovascular: Positive for chest pain.  Gastrointestinal: Negative.   Musculoskeletal: Negative.   Skin: Negative.   Neurological: Negative.   Hematological: Negative.   Psychiatric/Behavioral: Negative.   All other systems reviewed and are negative.    Allergies  Review of patient's allergies indicates no known allergies.  Home Medications   Current Outpatient Rx  Name Route Sig Dispense Refill  . HYDROCODONE-HOMATROPINE 5-1.5 MG/5ML PO SYRP Oral Take 5 mLs by mouth every 6 (six) hours as needed.  For pain      BP 139/84  Pulse 104  Temp(Src) 98.5 F (36.9 C) (Oral)  Resp 20  SpO2 100%  LMP 10/29/2011  Physical Exam  Nursing note and vitals reviewed. Constitutional: She appears well-developed and well-nourished.  HENT:  Head: Normocephalic and atraumatic.  Eyes: Conjunctivae are normal. Pupils are equal, round, and reactive to light.  Neck: Neck supple. No tracheal deviation present. No thyromegaly present.  Cardiovascular: Normal rate and regular rhythm.   No  murmur heard. Pulmonary/Chest: Effort normal and breath sounds normal.  Abdominal: Soft. Bowel sounds are normal. She exhibits no distension. There is no tenderness.  Musculoskeletal: Normal range of motion. She exhibits no edema and no tenderness.  Neurological: She is alert. Coordination normal.  Skin: Skin is warm and dry. No rash noted.  Psychiatric: She has a normal mood and affect.    ED Course  Procedures (including critical care time)  Date: 11/26/2011  Rate: 95  Rhythm: normal sinus rhythm  QRS Axis: normal  Intervals: normal  ST/T Wave abnormalities: nonspecific T wave changes  Conduction Disutrbances:none  Narrative Interpretation:   Old EKG Reviewed: changes noted Diffuse ST segment elevation possibly consistent with pericarditis Change from 10/27/2011   Labs Reviewed  CBC  DIFFERENTIAL  COMPREHENSIVE METABOLIC PANEL   No results found.   No diagnosis found.   5 PM resting comfortably no distress Spoke witH Mcalheney, for [place] cardiology Results for orders placed during the hospital encounter of 11/26/11  CBC      Component Value Range   WBC 12.5 (*) 4.0 - 10.5 (K/uL)   RBC 4.99  3.87 - 5.11 (MIL/uL)   Hemoglobin 10.3 (*) 12.0 - 15.0 (g/dL)   HCT 16.1 (*) 09.6 - 46.0 (%)   MCV 63.7 (*) 78.0 - 100.0 (fL)   MCH 20.6 (*) 26.0 - 34.0 (pg)   MCHC 32.4  30.0 - 36.0 (g/dL)   RDW 04.5 (*) 40.9 - 15.5 (%)   Platelets 261  150 - 400 (K/uL)  DIFFERENTIAL      Component Value Range   Neutrophils Relative 79 (*) 43 - 77 (%)   Lymphocytes Relative 11 (*) 12 - 46 (%)   Monocytes Relative 9  3 - 12 (%)   Eosinophils Relative 1  0 - 5 (%)   Basophils Relative 0  0 - 1 (%)   Neutro Abs 9.9 (*) 1.7 - 7.7 (K/uL)   Lymphs Abs 1.4  0.7 - 4.0 (K/uL)   Monocytes Absolute 1.1 (*) 0.1 - 1.0 (K/uL)   Eosinophils Absolute 0.1  0.0 - 0.7 (K/uL)   Basophils Absolute 0.0  0.0 - 0.1 (K/uL)   RBC Morphology POLYCHROMASIA PRESENT     Smear Review PLATELET COUNT CONFIRMED BY  SMEAR    D-DIMER, QUANTITATIVE      Component Value Range   D-Dimer, Quant 1.60 (*) 0.00 - 0.48 (ug/mL-FEU)  POCT PREGNANCY, URINE      Component Value Range   Preg Test, Ur NEGATIVE  NEGATIVE   POCT I-STAT TROPONIN I      Component Value Range   Troponin i, poc 0.00  0.00 - 0.08 (ng/mL)   Comment 3            Dg Chest 1 View  11/02/2011  *RADIOLOGY REPORT*  Clinical Data: Status post left thoracentesis  CHEST - 1 VIEW  Comparison: Portable chest x-ray of 10/29/2011  Findings: The volume of the left pleural effusion has diminished considerably after left thoracentesis.  No pneumothorax is seen. The right lung is clear other than mild basilar atelectasis. Cardiomegaly is stable.  IMPRESSION: Significant reduction in volume of the left pleural effusion after left thoracentesis.  No pneumothorax.  Original Report Authenticated By: Juline Patch, M.D.   Dg Chest 1 View  10/29/2011  *RADIOLOGY REPORT*  Clinical Data: Left thoracentesis.  CHEST - 1 VIEW  Comparison: 10/27/2011.  Findings: Trachea is midline.  Heart is enlarged, stable.  There is a large left pleural effusion with left basilar airspace disease. No pneumothorax after left thoracentesis.  Dilated and food- containing esophagus is seen along the medial right hemithorax. Probable right basilar atelectasis or scarring.  IMPRESSION:  1.  No pneumothorax after left thoracentesis. 2.  Large left pleural effusion with left basilar airspace disease. 3.  Right basilar atelectasis or scarring. 4.  Achalasia.  Original Report Authenticated By: Reyes Ivan, M.D.   Dg Chest 2 View  11/12/2011  *RADIOLOGY REPORT*  Clinical Data: Shortness breath.  Weakness. Heller myotomy on 11/07/2011  CHEST - 2 VIEW  Comparison: 11/05/2011  Findings: Right paramediastinal density is compatible with dilated esophagus related to the patient's prior achalasia.  Moderate left pleural effusion noted, stable from 11/05/2011.  Linear scarring noted in the right lower  lobe, similar to prior exams.  No pneumomediastinum or pneumothorax is observed  IMPRESSION:  1.  Stable radiographic appearance of the chest, with scarring at the right lung base. 2.  Stable dilated esophagus. 3.  Stable moderate sized left pleural effusion.  Original Report Authenticated By: Dellia Cloud, M.D.   Dg Chest 2 View  11/05/2011  *RADIOLOGY REPORT*  Clinical Data: Left pleural effusion.  Eight Aluisio.  CHEST - 2 VIEW  Comparison: 11/02/2011  Findings: Heart size remains normal.  There has been further accumulation of pleural fluid on the left with left base atelectasis.  There is mild atelectasis at the right base. Mediastinal shadow related to dilated esophagus containing fluid and air remains evident, consistent with the patient's history of achalasia.  No significant bony finding.  IMPRESSION: Further accumulation of pleural fluid on the left with more left base atelectasis.  Mild patchy volume loss right base.  Findings of achalasia.  Original Report Authenticated By: Thomasenia Sales, M.D.   Ct Angio Chest W/cm &/or Wo Cm  11/26/2011  *RADIOLOGY REPORT*  Clinical Data: Left chest pain.  CT ANGIOGRAPHY CHEST  Technique:  Multidetector CT imaging of the chest using the standard protocol during bolus administration of intravenous contrast. Multiplanar reconstructed images including MIPs were obtained and reviewed to evaluate the vascular anatomy.  Contrast: OMNIPAQUE IOHEXOL 300 MG/ML IJ SOLN  Comparison: 10/30/2011.  Findings: No pulmonary embolus.  Mediastinal lymph nodes measure up to 9 mm anterior to the left pulmonary artery, stable.  No hilar or axillary adenopathy.  Heart is mildly enlarged.  Small pericardial effusion, stable.  The esophagus is markedly dilated, contains food debris and is located in the medial right hemithorax.  Atelectasis and/or scarring in the right lower lobe.  Small left pleural effusion, decreased from prior, with compressive atelectasis in the left  lower lobe.  Airway is unremarkable.  Incidental imaging of the upper abdomen shows no acute findings. No worrisome lytic or sclerotic lesions.  IMPRESSION:  1.  No pulmonary embolus. 2.  Small left pleural effusion with compressive atelectasis in the left lower lobe, improved from 10/30/2011. 3.  Small pericardial effusion, stable. 4.  Esophageal dilatation, consistent with a known history of achalasia.  Original Report Authenticated By: Reyes Ivan, M.D.   Ct Angio Chest W/cm &/or Wo Cm  10/30/2011  *RADIOLOGY REPORT*  Clinical Data: Shortness of breath, mid chest pressure, high D- dimer  CT ANGIOGRAPHY CHEST  Technique:  Multidetector CT imaging of the chest using the standard protocol during bolus administration of intravenous contrast. Multiplanar reconstructed images including MIPs were obtained and reviewed to evaluate the vascular anatomy.  Contrast: OMNIPAQUE IOHEXOL 300 MG/ML IV SOLN  Comparison: Portable chest x-ray of 10/29/2011 and CT chest of 10/18/2011  Findings: The pulmonary arteries opacify well and there is no evidence of acute pulmonary embolism.  Some respiratory motion does obscure detail in the lung bases.  The thoracic aorta opacifies with no acute abnormality.  There are mediastinal nodes present which are not pathologically enlarged.  There is cardiomegaly present, and there does appear to be a small pericardial effusion present.  There is a moderately large left pleural effusion present with atelectasis of much of the left lower lobe.  No central endobronchial lesion is seen.  This atelectasis may be compressive as a result of the left pleural effusion. The esophagus is again noted to be diffusely dilated with food particles, in this patient with history of achalasia.  This appearance is unchanged compared to prior CTs.  On the lung window images compressive atelectasis of the left lower lobe is noted.  No lung nodule or infiltrate is seen.  The moderately large left pleural  effusion again is noted.  No bony abnormality is seen.  IMPRESSION:  1.  No evidence of acute pulmonary embolism. 2.  Moderately large left pleural effusion with probable compressive atelectasis of the left lower lobe.  No central endobronchial lesion is seen. 3.  Cardiomegaly and small pericardial effusion. 4.  Changes of achalasia again are noted.  Original Report Authenticated By: Juline Patch, M.D.   [place] Chest  10/28/2011  *RADIOLOGY REPORT*  Clinical Data: Shortness of breath, right chest pain, previous left thoracentesis  CHEST ULTRASOUND  Comparison: 10/27/2011 and earlier studies  Findings: There is a moderate residual or recurrent left pleural effusion.  No right pleural  fluid is identified.  IMPRESSION:  1.  Moderate residual or recurrent left pleural effusion.  Original Report Authenticated By: Osa Craver, M.D.   US Thoracentesis  11/02/2011  *RADIOLOGY REPORT*  Clinical Data:  History of achalasia with left pleural effusion. Request been made for therapeutic and diagnostic left-sided thoracentesis.  ULTRASOUND GUIDED left THORACENTESIS  Comparison:  Prior ultrasound of the chest and prior diagnostic thoracentesis.  An ultrasound guided thoracentesis was thoroughly discussed with the patient and questions answered.  The benefits, risks, alternatives and complications were also discussed.  The patient understands and wishes to proceed with the procedure.  Written consent was obtained.  Ultrasound was performed to localize and mark an adequate pocket of fluid in the left chest.  The area was then prepped and draped in the normal sterile fashion.  1% Lidocaine was used for local anesthesia.  Under ultrasound guidance a 19 gauge Yueh catheter was introduced.  Thoracentesis was performed.  The catheter was removed and a dressing applied.  Complications:  None immediate  Findings: A total of approximately 700 ml of cloudy yellow serous fluid was removed. A fluid sample was sent for laboratory  analysis.  IMPRESSION: Successful ultrasound guided left thoracentesis yielding 700 ml of pleural fluid.  Read by: Anselm Pancoast, P.A.-C  Original Report Authenticated By: Richarda Overlie, M.D.  Dg Chest Portable 1 View  11/26/2011  *RADIOLOGY REPORT*  Clinical Data: Chest pain.  Pleural effusion.  Follow-up evaluation.  PORTABLE CHEST - 1 VIEW  Comparison: 11/12/2011.  Findings: There is a moderate-sized left basilar pleural effusion which is unchanged.  There is no pneumothorax.  There is mild cardiomegaly.  There are no edematous changes.  IMPRESSION: Moderate sized left basilar pleural effusion without significant change.  No pneumothorax.  Original Report Authenticated By: Rolla Plate, M.D.   Dg Chest Port 1 View  10/27/2011  *RADIOLOGY REPORT*  Clinical Data: Severe shortness of breath and history of pleural effusion and achalasia.  PORTABLE CHEST - 1 VIEW  Comparison: 10/22/2011 and CT from 10/18/2011 pain  Findings: Single view of the chest again demonstrates an enlarged esophagus along the right side of the chest.  There is high-density material within the esophagus and similar to the prior examination. There are increased densities at the left lung base suggestive for pleural fluid and possibly consolidation.  Slightly increased densities at the right lung base could represent atelectasis.  The upper lungs are clear.  The heart is obscured by the left basilar densities.  IMPRESSION: Increased densities at the left lung base could represent increased pleural fluid and consolidation.  Slightly increased densities in the right lower lung may represent volume loss.  Enlarged esophagus containing high-density material.  Findings compatible with history of achalasia.  Original Report Authenticated By: Richarda Overlie, M.D.   Varney Biles Kayleen Memos W/water Sol Cm  11/08/2011  *RADIOLOGY REPORT*  Clinical Data:  Patient status post Heller myotomy for achalasia.  UPPER GI SERIES WITH KUB  Technique:  Routine upper GI series  was performed with water soluble contrast  Fluoroscopy Time: 1.4 minutes  Comparison:  Upper GI 05/17/2011, CT 10/17/1998  Findings: The patient was administered 50 ml of water-soluble contrast.  A thin stream of contrast flows through the gastroesophageal l junction.  No evidence of extraluminal contrast to suggest leak.  The distal esophagus is patulous as expected.  IMPRESSION:  1.  No evidence of leak at the myotomy site. 2.  Thin stream of contrast passes through the GE junction.  Original Report Authenticated By: Genevive Bi, M.D.   US Thoracentesis Asp Pleural Space W/img Guide  10/29/2011  *RADIOLOGY REPORT*  Clinical Data:  Left pleural effusion  ULTRASOUND GUIDED left THORACENTESIS  Comparison:  10/20/2011  An ultrasound guided thoracentesis was thoroughly discussed with the patient and questions answered.  The benefits, risks, alternatives and complications were also discussed.  The patient understands and wishes to proceed with the procedure.  Written consent was obtained.  Ultrasound was performed to localize and mark an adequate pocket of fluid in the left chest.  The area was then prepped and draped in the normal sterile fashion.  1% Lidocaine was used for local anesthesia.  Under ultrasound guidance a 19 gauge Yueh catheter was introduced.  Thoracentesis was performed. Only five ml of fluid were obtained. The procedure was repeated and additional time with a new Yeuh angiocath.  Again, only 5 ml were obtained.  The catheter was removed and a dressing applied.  Complications:  None  Findings: A total of approximately 10 ml of clear yellow fluid was removed. A fluid sample was sent for laboratory analysis.  IMPRESSION: Successful ultrasound guided left thoracentesis yielding 10 ml of pleural fluid.  Original Report Authenticated By: Donavan Burnet, M.D.    MDM  Plan Tylenol. We will not give and saved in light of patient's recent  surgery To followup at office where she will likely need  echocardiography She is exhibiting no signs of tamponade Diagnosis pericardidts        Doug Sou, MD 11/26/11 1709

## 2011-11-29 ENCOUNTER — Ambulatory Visit (INDEPENDENT_AMBULATORY_CARE_PROVIDER_SITE_OTHER): Payer: PRIVATE HEALTH INSURANCE | Admitting: Surgery

## 2011-11-29 ENCOUNTER — Encounter: Payer: Self-pay | Admitting: Cardiology

## 2011-11-29 ENCOUNTER — Other Ambulatory Visit: Payer: Self-pay

## 2011-11-29 ENCOUNTER — Ambulatory Visit (INDEPENDENT_AMBULATORY_CARE_PROVIDER_SITE_OTHER): Payer: PRIVATE HEALTH INSURANCE | Admitting: Cardiology

## 2011-11-29 ENCOUNTER — Encounter (INDEPENDENT_AMBULATORY_CARE_PROVIDER_SITE_OTHER): Payer: Self-pay | Admitting: Surgery

## 2011-11-29 ENCOUNTER — Ambulatory Visit (HOSPITAL_COMMUNITY): Payer: PRIVATE HEALTH INSURANCE | Attending: Cardiovascular Disease

## 2011-11-29 VITALS — BP 126/84 | HR 105 | Ht 66.0 in | Wt 191.0 lb

## 2011-11-29 VITALS — BP 122/74 | HR 68 | Temp 98.5°F | Resp 24 | Ht 66.0 in | Wt 190.0 lb

## 2011-11-29 DIAGNOSIS — I313 Pericardial effusion (noninflammatory): Secondary | ICD-10-CM

## 2011-11-29 DIAGNOSIS — R000 Tachycardia, unspecified: Secondary | ICD-10-CM

## 2011-11-29 DIAGNOSIS — I319 Disease of pericardium, unspecified: Secondary | ICD-10-CM

## 2011-11-29 DIAGNOSIS — K220 Achalasia of cardia: Secondary | ICD-10-CM

## 2011-11-29 DIAGNOSIS — R072 Precordial pain: Secondary | ICD-10-CM | POA: Insufficient documentation

## 2011-11-29 DIAGNOSIS — R079 Chest pain, unspecified: Secondary | ICD-10-CM | POA: Insufficient documentation

## 2011-11-29 MED ORDER — INDOMETHACIN 25 MG/5ML PO SUSP: 50.0000 mg | Freq: Two times a day (BID) | ORAL | Status: DC

## 2011-11-29 NOTE — Patient Instructions (Signed)
Your physician recommends that you schedule a follow-up appointment in: 4 weeks  START INDOMETHACIN 50MG /10CC TWICE DAILY WITH FOOD X 10 DAYS  Your physician recommends that you return for lab work in: TODAY

## 2011-11-29 NOTE — Assessment & Plan Note (Signed)
Management per gastroenterology. 

## 2011-11-29 NOTE — Progress Notes (Signed)
HPI:  41 year old female with no prior cardiac history for evaluation of chest pain and dyspnea. Patient has a long history of achalasia. She was admitted in February of 2013 for aspiration pneumonia. The patient had thoracentesis of a left effusion that apparently was unrevealing. Echocardiogram in February of 2013 showed an ejection fraction of 50-55% and a small pericardial effusion. Readmitted in February with complaints of chest pain. She apparently had repeat thoracentesis. 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 She ultimately had myotomy for her achalasia. Patient did well until 5 days ago when she developed increasing pain in the left side of her chest. It increased with inspiration and with lying flat. She has also noticed progressive increased shortness of breath with exertion. There is mild orthopnea. There is no pedal edema. She has not had syncope. She notices increased palpitations with exertion. She was seen in emergency room on March 18 and a CT scan was performed following an elevated d-dimer. This showed no pulmonary embolus. There is a small left pleural effusion with compressive atelectasis in the left lower lobe. There was a small stable pericardial effusion. Because of her chest pain and dyspnea we were asked to further evaluate.  Current Outpatient Prescriptions  Medication Sig Dispense Refill  . HYDROcodone-homatropine (HYCODAN) 5-1.5 MG/5ML syrup Take 5 mLs by mouth every 6 (six) hours as needed. For pain      . indomethacin (INDOCIN) 25 MG/5ML SUSP Take 10 mLs (50 mg total) by mouth 2 (two) times daily with a meal.  200 mL  0    No Known Allergies  Past Medical History  Diagnosis Date  . GERD (gastroesophageal reflux disease)   . Achalasia     S/p Heller myotomy 1991 and numerous dilations  . Pleural effusion     Past Surgical History  Procedure Date  . Achalasia surgery 1991    transthoracic Heller  myotomy in [place]  . Foreign body removal 10/08/2011    Procedure: FOREIGN BODY REMOVAL;  Surgeon: Petra Kuba, MD;  Location: [facility] ENDOSCOPY;  Service: Endoscopy;  Laterality: N/A;  botox  needed /ja/magod  . Esophagogastroduodenoscopy 10/08/2011    Procedure: ESOPHAGOGASTRODUODENOSCOPY (EGD);  Surgeon: Petra Kuba, MD;  Location[facility] ENDOSCOPY;  Service: Endoscopy;  Laterality: N/A;  . Esophagogastroduodenoscopy 10/16/2011    Procedure: ESOPHAGOGASTRODUODENOSCOPY (EGD);  Surgeon: Florencia Reasons, MD;  Location: [facility] ENDOSCOPY;  Service: Endoscopy;  Laterality: N/A;  . Foreign body removal 10/16/2011    Procedure: FOREIGN BODY REMOVAL;  Surgeon: Florencia Reasons, MD;  Location: [facility] ENDOSCOPY;  Service: Endoscopy;  Laterality: N/A;  . Cesarean section     x2  . Esophagogastroduodenoscopy 11/06/2011    Procedure: ESOPHAGOGASTRODUODENOSCOPY (EGD);  Surgeon: Shirley Friar, MD;  Location: [facility] ENDOSCOPY;  Service: Endoscopy;  Laterality: N/A;  Idell Pickles myotomy 11/07/2011    Procedure: LAPAROSCOPIC HELLER MYOTOMY;  Surgeon: Valarie Merino, MD;  Location: [facility] ORS;  Service: General;  Laterality: N/A;  Laparoscopic Redo Heller Myotomy   . Tonsillectomy     History   Social History  . Marital Status: Married    Spouse Name: N/A    Number of Children: 2  . Years of Education: N/A   Occupational History  .  [employer]   Social History Main Topics  . Smoking status: Former Smoker -- .5 years    Types: Cigarettes    Quit date: 10/07/1990  . Smokeless tobacco: Never Used  Comment: Smoked for 2 months, nothing heavier  . Alcohol Use: Yes     Rarely  . Drug Use: No  . Sexually Active: Yes   Other Topics Concern  . Not on file   Social History Narrative   Lives at home with husband, working and goes to school, has 2 kids. Very minimal smoking history, very rare alcohol.     Family History  Problem Relation Age of Onset  . Cancer Paternal Grandmother     breast  . Hypertension  Mother   . Anesthesia problems Neg Hx   . Hypotension Neg Hx   . Malignant hyperthermia Neg Hx   . Pseudochol deficiency Neg Hx     ROS: Complains of fatigue and palpitations with exertion but no fevers or chills, productive cough, hemoptysis, dysphasia, odynophagia, melena, hematochezia, dysuria, hematuria, rash, seizure activity, orthopnea, PND, pedal edema, claudication. Remaining systems are negative.  Physical Exam:  Blood pressure 126/84, pulse 105, height 5\' 6"  (1.676 m), weight 191 lb (86.637 kg), last menstrual period 10/29/2011.  General:  Well developed/well nourished in NAD Skin warm/dry Patient not depressed No peripheral clubbing Back-normal HEENT-normal/normal eyelids Neck supple/normal carotid upstroke bilaterally; no bruits; no JVD; no thyromegaly chest - mild diminished breath sounds at the bases bilaterally. CV - RRR/normal S1 and S2; no rubs or gallops;  PMI nondisplaced; 1/6 systolic ejection murmur Abdomen -NT/ND, no HSM, no mass, + bowel sounds, no bruit 2+ femoral pulses, no bruits Ext-no edema, chords, 2+ DP Neuro-grossly nonfocal  ECG sinus rhythm at a rate of 99. No significant ST changes. Electrocardiogram from March 18 with subtle diffuse ST elevation.

## 2011-11-29 NOTE — Assessment & Plan Note (Signed)
Patient symptoms may be from pericarditis. We repeated an echocardiogram today in the office. I did review this and feel that her ejection fraction was normal at 55. She had a very small pericardial effusion. Her pleural and pericardial effusion may be from mediastinitis from her previous procedures and aspiration. I will begin Indocin 50 mg by mouth twice a day for approximately 10 days. Hopefully this will improve her chest pain. She is also complaining of dyspnea. A recent CT scan showed no pulmonary embolus. Her LV function is normal. This may be related to her pleural effusion but the CT scan also suggested this was smaller. Hopefully her dyspnea will improve as her inflammatory process improves. I do not find any symptoms consistent with pneumonia at present.

## 2011-11-29 NOTE — Assessment & Plan Note (Signed)
Pericardial effusion is very small. This may be related to recent mediastinitis. Check TSH. Indocin as outlined.

## 2011-11-29 NOTE — Patient Instructions (Signed)
Rhys developed sharp left-sided chest pain on Sunday. Prior to that time she was exercising moderately and not having any chest pain. Her swallowing was improved after her redo laparoscopic Heller myotomy. Since Sunday she has noticed chest pain with exercise and her heart racing with minimal exercise. She also gets short of breath with minimal exercise. A CTA was performed as was a long emergency room which showed that her left pleural effusion was diminished and no PE. She had a small pericardial effusion.  Her symptoms are  concerning of pericarditis and she will see Dr. Olga Millers this afternoon.  I will see her back in 4 weeks at sooner if necessary. Impression worried about pericarditis.

## 2011-11-29 NOTE — Progress Notes (Signed)
Since [name] has had sharp pain in the left chest that then became more of severe pain in chest discomfort when she exercised. This has markedly decreased her exercise tolerance. She was seen in the emergency room and her left chest effusion is less. She says she is swallowing better since her redo Heller myotomy.  Her main concern is chest pain with minimal exertion.    I did not hear a rub when I listened to her heart.  Her lung sounds were diminished in the base without egophony.  She has seen Dr. Jens Som at 3:00 this afternoon. I am concerned that she has pericarditis. I told her let me know what his treatment plan is after he sees her.  Will see her back in 4 weeks if not before.

## 2011-11-29 NOTE — Assessment & Plan Note (Signed)
Patient complains of elevated heart rate with ambulation. If this does not improve with treating her pericarditis we will consider a monitor.

## 2011-11-30 LAB — TSH: TSH: 3.51 u[IU]/mL (ref 0.35–5.50)

## 2011-12-03 ENCOUNTER — Ambulatory Visit: Payer: PRIVATE HEALTH INSURANCE | Admitting: Cardiology

## 2011-12-04 ENCOUNTER — Telehealth: Payer: Self-pay | Admitting: Cardiology

## 2011-12-04 NOTE — Telephone Encounter (Signed)
She will need a primary care physician; lupus outside cardiology area of expertise. [name]

## 2011-12-04 NOTE — Telephone Encounter (Signed)
Spoke with pt, she is concerned because all of the symptoms she is having can be related to lupus. She wanted to know if we could check her for lupus. She currently does not have a primary care md. Will forward for dr Jens Som review

## 2011-12-04 NOTE — Telephone Encounter (Signed)
Pt wants to talk to you she has some things she needs to go over with you and she would not tell me anything

## 2011-12-04 NOTE — Telephone Encounter (Signed)
Follow-up:    Patient called returning your phone call. Please call back. 

## 2011-12-04 NOTE — Telephone Encounter (Signed)
Spoke with pt, aware she would need a primary care md to treat if positive for lupus. She will find a provider.

## 2011-12-11 ENCOUNTER — Telehealth: Payer: Self-pay | Admitting: Cardiology

## 2011-12-11 MED ORDER — INDOMETHACIN 25 MG/5ML PO SUSP: 50.0000 mg | Freq: Two times a day (BID) | ORAL | Status: DC

## 2011-12-11 NOTE — Telephone Encounter (Signed)
Voicemail not set up , unable to leave a message.

## 2011-12-11 NOTE — Telephone Encounter (Signed)
Patient requests return call regarding discomfort in left side of chest, no dizziness or SOB.  Only feels discomfort when she breaths in or yawns.  Please return call to patient at [phone].

## 2011-12-11 NOTE — Telephone Encounter (Signed)
Did see Dr Jens Som for this pain on 3/21 and he Rx Indomethacin for 10 days, finished on 3/31.  The pain is not as bad as it was when she saw Dr Jens Som, but this is how it started.  Advised Dr Jens Som out of the office, will forward to [facility] NP for reveiw

## 2011-12-11 NOTE — Telephone Encounter (Signed)
Advised patient, will call back if pain continues.  Has follow up appointment 4/18

## 2011-12-11 NOTE — Telephone Encounter (Signed)
Repeat the Indocin for one week. She had an echo at her last visit which showed a very small pericardial effusion, felt to be stable with a normal EF. Has what looks like chronic mediastinitis. Does she have follow up scheduled with Dr. Jens Som?

## 2011-12-24 ENCOUNTER — Ambulatory Visit: Payer: PRIVATE HEALTH INSURANCE | Admitting: Cardiovascular Disease

## 2011-12-27 ENCOUNTER — Encounter: Payer: Self-pay | Admitting: Cardiology

## 2011-12-27 ENCOUNTER — Ambulatory Visit (INDEPENDENT_AMBULATORY_CARE_PROVIDER_SITE_OTHER): Payer: BC Managed Care – PPO | Admitting: Cardiology

## 2011-12-27 VITALS — BP 138/80 | HR 76 | Resp 18 | Ht 66.0 in | Wt 199.8 lb

## 2011-12-27 DIAGNOSIS — K220 Achalasia of cardia: Secondary | ICD-10-CM

## 2011-12-27 DIAGNOSIS — I319 Disease of pericardium, unspecified: Secondary | ICD-10-CM

## 2011-12-27 DIAGNOSIS — I313 Pericardial effusion (noninflammatory): Secondary | ICD-10-CM

## 2011-12-27 NOTE — Progress Notes (Signed)
HPI: 41 year old female with no prior cardiac history I initially saw in March of 2013 for evaluation of chest pain and dyspnea. Patient has a long history of achalasia. She was admitted in February of 2013 for aspiration pneumonia. The patient had thoracentesis of a left effusion that apparently was unrevealing. Echocardiogram in February of 2013 showed an ejection fraction of 50-55% and a small pericardial effusion. Readmitted in February with complaints of chest pain. She apparently had repeat thoracentesis. 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 She ultimately had myotomy for her achalasia. She was seen in emergency room on March 18 and a CT scan was performed following an elevated d-dimer. This showed no pulmonary embolus. There is a small left pleural effusion with compressive atelectasis in the left lower lobe. There was a small stable pericardial effusion. We felt her chest pain may be related to pericarditis. We did add Indocin. A TSH was normal. Repeat echocardiogram on 11/29/2011 showed an ejection fraction of 50% and mild biatrial enlargement. There was a small pericardial effusion. Since I last saw her she is markedly improved. After taking Indocin for 10 days her symptoms resolved. They did return after discontinuing so she took an additional 7 days. She completed that approximately 1-1/2 weeks ago. She now denies dyspnea, palpitations or syncope. She has no pleuritic chest pain. She occasionally feels discomfort in her left side when lying. She had mild pedal edema 2 days ago which has resolved.   Current Outpatient Prescriptions  Medication Sig Dispense Refill  . HYDROcodone-homatropine (HYCODAN) 5-1.5 MG/5ML syrup Take 5 mLs by mouth every 6 (six) hours as needed. For pain      . indomethacin (INDOCIN) 25 MG/5ML SUSP Take 10 mLs (50 mg total) by mouth 2 (two) times daily with a meal.  140 mL  0     Past Medical History    Diagnosis Date  . GERD (gastroesophageal reflux disease)   . Achalasia     S/p Heller myotomy 1991 and numerous dilations  . Pleural effusion     Past Surgical History  Procedure Date  . Achalasia surgery 1991    transthoracic Heller myotomy in [place]  . Foreign body removal 10/08/2011    Procedure: FOREIGN BODY REMOVAL;  Surgeon: Petra Kuba, MD;  Location: [facility] ENDOSCOPY;  Service: Endoscopy;  Laterality: N/A;  botox  needed /ja/magod  . Esophagogastroduodenoscopy 10/08/2011    Procedure: ESOPHAGOGASTRODUODENOSCOPY (EGD);  Surgeon: Petra Kuba, MD;  Location: [facility] ENDOSCOPY;  Service: Endoscopy;  Laterality: N/A;  . Esophagogastroduodenoscopy 10/16/2011    Procedure: ESOPHAGOGASTRODUODENOSCOPY (EGD);  Surgeon: Florencia Reasons, MD;  Location: [facility] ENDOSCOPY;  Service: Endoscopy;  Laterality: N/A;  . Foreign body removal 10/16/2011    Procedure: FOREIGN BODY REMOVAL;  Surgeon: Florencia Reasons, MD;  Location: [facility] ENDOSCOPY;  Service: Endoscopy;  Laterality: N/A;  . Cesarean section     x2  . Esophagogastroduodenoscopy 11/06/2011    Procedure: ESOPHAGOGASTRODUODENOSCOPY (EGD);  Surgeon: Shirley Friar, MD;  Location: [facility] ENDOSCOPY;  Service: Endoscopy;  Laterality: N/A;  Idell Pickles myotomy 11/07/2011    Procedure: LAPAROSCOPIC HELLER MYOTOMY;  Surgeon: Valarie Merino, MD;  Location: [facility] ORS;  Service: General;  Laterality: N/A;  Laparoscopic Redo Heller Myotomy   . Tonsillectomy     History   Social History  . Marital Status: Married    Spouse Name: N/A    Number of Children: 2  . Years of Education:  N/A   Occupational History  .  [employer]   Social History Main Topics  . Smoking status: Former Smoker -- .5 years    Types: Cigarettes    Quit date: 10/07/1990  . Smokeless tobacco: Never Used   Comment: Smoked for 2 months, nothing heavier  . Alcohol Use: Yes     Rarely  . Drug Use: No  . Sexually Active: Yes   Other Topics Concern  . Not on file   Social History  Narrative   Lives at home with husband, working and goes to school, has 2 kids. Very minimal smoking history, very rare alcohol.     ROS: no fevers or chills, productive cough, hemoptysis, dysphasia, odynophagia, melena, hematochezia, dysuria, hematuria, rash, seizure activity, orthopnea, PND, pedal edema, claudication. Remaining systems are negative.  Physical Exam: Well-developed well-nourished in no acute distress.  Skin is warm and dry.  HEENT is normal.  Neck is supple. No thyromegaly.  Chest is clear to auscultation with normal expansion.  Cardiovascular exam is regular rate and rhythm.  Abdominal exam nontender or distended. No masses palpated. Extremities show no edema. neuro grossly intact

## 2011-12-27 NOTE — Patient Instructions (Signed)
Your physician wants you to follow-up in: 6 MONTHS You will receive a reminder letter in the mail two months in advance. If you don't receive a letter, please call our office to schedule the follow-up appointment. 

## 2011-12-27 NOTE — Assessment & Plan Note (Signed)
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 She has taken 2 courses of Indocin and her symptoms have resolved. She now denies pleuritic chest pain or dyspnea. We will plan to follow for now. I will see her back in 6 months and repeat her echo at that time. Further evaluation if her symptoms return.

## 2011-12-27 NOTE — Assessment & Plan Note (Signed)
management per gastroenterology and surgery.

## 2012-01-10 ENCOUNTER — Ambulatory Visit: Payer: PRIVATE HEALTH INSURANCE | Admitting: Family Medicine

## 2012-02-13 ENCOUNTER — Ambulatory Visit: Payer: PRIVATE HEALTH INSURANCE | Admitting: Family Medicine

## 2012-02-13 DIAGNOSIS — Z0289 Encounter for other administrative examinations: Secondary | ICD-10-CM

## 2012-02-25 ENCOUNTER — Telehealth: Payer: Self-pay | Admitting: Cardiology

## 2012-02-25 NOTE — Telephone Encounter (Signed)
Please return call to patient at [phone], she would like to be seen today

## 2012-02-25 NOTE — Telephone Encounter (Signed)
Will see Dr Jens Som tomorrow at 10:00  She is having the same symptoms as before.  Chest pain, SOB trouble laying down.  Will add on to his schedule tomorrow

## 2012-02-26 ENCOUNTER — Encounter: Payer: Self-pay | Admitting: Cardiology

## 2012-02-26 ENCOUNTER — Ambulatory Visit (INDEPENDENT_AMBULATORY_CARE_PROVIDER_SITE_OTHER): Payer: BC Managed Care – PPO | Admitting: Cardiology

## 2012-02-26 ENCOUNTER — Ambulatory Visit (INDEPENDENT_AMBULATORY_CARE_PROVIDER_SITE_OTHER)
Admission: RE | Admit: 2012-02-26 | Discharge: 2012-02-26 | Disposition: A | Discharge status: Home / Self Care | Payer: BC Managed Care – PPO | Source: Ambulatory Visit | Attending: Cardiology | Admitting: Cardiology

## 2012-02-26 VITALS — BP 125/83 | HR 88 | Ht 66.0 in | Wt 204.0 lb

## 2012-02-26 DIAGNOSIS — R079 Chest pain, unspecified: Secondary | ICD-10-CM

## 2012-02-26 DIAGNOSIS — R0989 Other specified symptoms and signs involving the circulatory and respiratory systems: Secondary | ICD-10-CM

## 2012-02-26 DIAGNOSIS — R0609 Other forms of dyspnea: Secondary | ICD-10-CM

## 2012-02-26 DIAGNOSIS — R0600 Dyspnea, unspecified: Secondary | ICD-10-CM

## 2012-02-26 LAB — CBC WITH DIFFERENTIAL/PLATELET
Basophils Absolute: 0 10*3/uL (ref 0.0–0.1)
Basophils Relative: 0.1 % (ref 0.0–3.0)
Eosinophils Absolute: 0.1 10*3/uL (ref 0.0–0.7)
Eosinophils Relative: 0.9 % (ref 0.0–5.0)
HCT: 34.3 % — ABNORMAL LOW (ref 36.0–46.0)
Hemoglobin: 10.9 g/dL — ABNORMAL LOW (ref 12.0–15.0)
Lymphocytes Relative: 14.1 % (ref 12.0–46.0)
Lymphs Abs: 2.2 10*3/uL (ref 0.7–4.0)
MCHC: 31.8 g/dL (ref 30.0–36.0)
MCV: 67.2 fl — ABNORMAL LOW (ref 78.0–100.0)
Monocytes Absolute: 1.2 10*3/uL — ABNORMAL HIGH (ref 0.1–1.0)
Monocytes Relative: 7.6 % (ref 3.0–12.0)
Neutro Abs: 12.1 10*3/uL — ABNORMAL HIGH (ref 1.4–7.7)
Neutrophils Relative %: 77.3 % — ABNORMAL HIGH (ref 43.0–77.0)
Platelets: 268 10*3/uL (ref 150.0–400.0)
RBC: 5.11 Mil/uL (ref 3.87–5.11)
RDW: 16.3 % — ABNORMAL HIGH (ref 11.5–14.6)
WBC: 15.7 10*3/uL — ABNORMAL HIGH (ref 4.5–10.5)

## 2012-02-26 LAB — BASIC METABOLIC PANEL
BUN: 11 mg/dL (ref 6–23)
CO2: 26 mEq/L (ref 19–32)
Calcium: 9.1 mg/dL (ref 8.4–10.5)
Chloride: 106 mEq/L (ref 96–112)
Creatinine, Ser: 0.7 mg/dL (ref 0.4–1.2)
GFR: 112.36 mL/min (ref >60.00)
Glucose, Bld: 89 mg/dL (ref 70–99)
Potassium: 4.3 mEq/L (ref 3.5–5.1)
Sodium: 141 mEq/L (ref 135–145)

## 2012-02-26 LAB — BRAIN NATRIURETIC PEPTIDE: Pro B Natriuretic peptide (BNP): 60 pg/mL (ref 0.0–100.0)

## 2012-02-26 MED ORDER — INDOMETHACIN 50 MG PO CAPS: 50.0000 mg | ORAL_CAPSULE | Freq: Two times a day (BID) | ORAL | Status: AC

## 2012-02-26 NOTE — Assessment & Plan Note (Addendum)
Patient has developed recurrent chest pain that increases with inspiration and lying flat. She also describes dyspnea. We had felt previously that this was related to a pleuropericarditis related to previous aspiration pneumonia and procedures. Plan to check CBC, renal function and BNP. Resume Indocin 50 mg by mouth twice a day for 10 days. May need course of steroids. Her symptoms are identical but less severe compared to her previous presentation following aspiration pneumonia. I therefore do not think she has had a pulmonary embolus. Previous CT scan was negative. I will ask pulmonary to reassess. Note she had a thoracentesis previously for her pleural effusion. Repeat PA and lateral chest x-ray. No indication at present of recurrent infectious process.

## 2012-02-26 NOTE — Patient Instructions (Addendum)
Your physician recommends that you schedule a follow-up appointment in: 4 WEEKS  START INDOCIN 50 MG TWICE DAILY WITH FOOD X 10 DAYS   A chest x-ray takes a picture of the organs and structures inside the chest, including the heart, lungs, and blood vessels. This test can show several things, including, whether the heart is enlarges; whether fluid is building up in the lungs; and whether pacemaker / defibrillator leads are still in place. AT [address]  Your physician recommends that you HAVE LAB WORK TODAY  Your physician has requested that you have an echocardiogram. Echocardiography is a painless test that uses sound waves to create images of your heart. It provides your doctor with information about the size and shape of your heart and how well your heart's chambers and valves are working. This procedure takes approximately one hour. There are no restrictions for this procedure.   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

## 2012-02-26 NOTE — Assessment & Plan Note (Addendum)
Followup echocardiogram in March showed small effusion. Repeat study to make sure it has not enlarged.

## 2012-02-26 NOTE — Progress Notes (Signed)
[id number] female with no prior cardiac history I initially saw in March of 2013 for evaluation of chest pain and dyspnea. Patient has a long history of achalasia. She was admitted in February of 2013 for aspiration pneumonia. The patient had thoracentesis of a left effusion that apparently was unrevealing. Echocardiogram in February of 2013 showed an ejection fraction of 50-55% and a small pericardial effusion. Readmitted in February with complaints of chest pain. She apparently had repeat thoracentesis. 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 She ultimately had myotomy for her achalasia. She was seen in emergency room on March 18 and a CT scan was performed following an elevated d-dimer. This showed no pulmonary embolus. There is a small left pleural effusion with compressive atelectasis in the left lower lobe. There was a small stable pericardial effusion. 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 We did add Indocin. A TSH was normal. Repeat echocardiogram on 11/29/2011 showed an ejection fraction of 50% and mild biatrial enlargement. There was a small pericardial effusion. I last saw her in April of 2013 and her symptoms had improved. Since then, she did well with no recurrent symptoms until June 14. She now describes symptoms identical to when I saw her previously. She has developed pain in her left chest under her breast that increases with inspiration and with lying flat. She also notes some dyspnea on exertion but no orthopnea, PND, pedal edema or syncope. Low-grade fever at 99.3. No productive cough or hemoptysis. Her symptoms are not as severe as previous but are identical.   Current Outpatient Prescriptions  Medication Sig Dispense Refill  . IBUPROFEN PO Take by mouth as needed.      . Prenatal Vit-Fe Fumarate-FA (PRENATAL MULTIVITAMIN) TABS Take 1 tablet by mouth daily.         Past Medical History    Diagnosis Date  . GERD (gastroesophageal reflux disease)   . Achalasia     S/p Heller myotomy 1991 and numerous dilations  . Pleural effusion     Past Surgical History  Procedure Date  . Achalasia surgery 1991    transthoracic Heller myotomy in [place]  . Foreign body removal 10/08/2011    Procedure: FOREIGN BODY REMOVAL;  Surgeon: Petra Kuba, MD;  Location: [facility] ENDOSCOPY;  Service: Endoscopy;  Laterality: N/A;  botox  needed /ja/magod  . Esophagogastroduodenoscopy 10/08/2011    Procedure: ESOPHAGOGASTRODUODENOSCOPY (EGD);  Surgeon: Petra Kuba, MD;  Location: [facility] ENDOSCOPY;  Service: Endoscopy;  Laterality: N/A;  . Esophagogastroduodenoscopy 10/16/2011    Procedure: ESOPHAGOGASTRODUODENOSCOPY (EGD);  Surgeon: Florencia Reasons, MD;  Location: [facility] ENDOSCOPY;  Service: Endoscopy;  Laterality: N/A;  . Foreign body removal 10/16/2011    Procedure: FOREIGN BODY REMOVAL;  Surgeon: Florencia Reasons, MD;  Location: [facility] ENDOSCOPY;  Service: Endoscopy;  Laterality: N/A;  . Cesarean section     x2  . Esophagogastroduodenoscopy 11/06/2011    Procedure: ESOPHAGOGASTRODUODENOSCOPY (EGD);  Surgeon: Shirley Friar, MD;  Location: [facility] ENDOSCOPY;  Service: Endoscopy;  Laterality: N/A;  Idell Pickles myotomy 11/07/2011    Procedure: LAPAROSCOPIC HELLER MYOTOMY;  Surgeon: Valarie Merino, MD;  Location: [facility] ORS;  Service: General;  Laterality: N/A;  Laparoscopic Redo Heller Myotomy   . Tonsillectomy     History   Social History  . Marital Status: Married    Spouse Name: N/A    Number of Children: 2  . Years of Education: N/A  Occupational History  .  [employer]   Social History Main Topics  . Smoking status: Former Smoker -- .5 years    Types: Cigarettes    Quit date: 10/07/1990  . Smokeless tobacco: Never Used   Comment: Smoked for 2 months, nothing heavier  . Alcohol Use: Yes     Rarely  . Drug Use: No  . Sexually Active: Yes   Other Topics Concern  . Not on file   Social History  Narrative   Lives at home with husband, working and goes to school, has 2 kids. Very minimal smoking history, very rare alcohol.     ROS: no fevers or chills, productive cough, hemoptysis, dysphasia, odynophagia, melena, hematochezia, dysuria, hematuria, rash, seizure activity, orthopnea, PND, pedal edema, claudication. Remaining systems are negative.  Physical Exam: Well-developed well-nourished in no acute distress.  Skin is warm and dry.  HEENT is normal.  Neck is supple. No jugular venous distention Chest is clear to auscultation with normal expansion.  Cardiovascular exam is regular rate and rhythm. No rub Abdominal exam nontender or distended. No masses palpated. Extremities show no edema. neuro grossly intact  ECG sinus rhythm at a rate of 88. No ST changes.

## 2012-02-28 ENCOUNTER — Telehealth: Payer: Self-pay | Admitting: Cardiology

## 2012-02-28 ENCOUNTER — Ambulatory Visit (HOSPITAL_COMMUNITY): Payer: BC Managed Care – PPO | Attending: Cardiology

## 2012-02-28 ENCOUNTER — Ambulatory Visit: Payer: BC Managed Care – PPO | Admitting: Physician Assistant

## 2012-02-28 NOTE — Telephone Encounter (Signed)
Unable to reach pt or leave a message  

## 2012-02-28 NOTE — Telephone Encounter (Signed)
New msg Pt wants to know chest xray results

## 2012-02-29 NOTE — Telephone Encounter (Signed)
Fu call Pt calling back about xray results

## 2012-02-29 NOTE — Telephone Encounter (Signed)
Spoke with pt, aware of labs and cxr results. She has a follow up with pulmonary and the results were forwarded to dr Beverely Low for her review.

## 2012-03-07 ENCOUNTER — Ambulatory Visit: Payer: Self-pay | Admitting: Family Medicine

## 2012-03-07 DIAGNOSIS — Z0289 Encounter for other administrative examinations: Secondary | ICD-10-CM

## 2012-03-11 ENCOUNTER — Other Ambulatory Visit (HOSPITAL_COMMUNITY): Payer: BC Managed Care – PPO

## 2012-03-17 ENCOUNTER — Ambulatory Visit (HOSPITAL_COMMUNITY): Payer: BC Managed Care – PPO | Attending: Cardiology

## 2012-03-17 DIAGNOSIS — R079 Chest pain, unspecified: Secondary | ICD-10-CM

## 2012-03-17 DIAGNOSIS — K220 Achalasia of cardia: Secondary | ICD-10-CM | POA: Insufficient documentation

## 2012-03-17 DIAGNOSIS — I319 Disease of pericardium, unspecified: Secondary | ICD-10-CM | POA: Insufficient documentation

## 2012-03-17 DIAGNOSIS — Z87891 Personal history of nicotine dependence: Secondary | ICD-10-CM | POA: Insufficient documentation

## 2012-03-17 DIAGNOSIS — R072 Precordial pain: Secondary | ICD-10-CM | POA: Insufficient documentation

## 2012-03-17 DIAGNOSIS — R0600 Dyspnea, unspecified: Secondary | ICD-10-CM

## 2012-03-17 DIAGNOSIS — R0609 Other forms of dyspnea: Secondary | ICD-10-CM | POA: Insufficient documentation

## 2012-03-17 DIAGNOSIS — R0989 Other specified symptoms and signs involving the circulatory and respiratory systems: Secondary | ICD-10-CM | POA: Insufficient documentation

## 2012-03-17 NOTE — Progress Notes (Signed)
Echocardiogram performed.  

## 2012-03-21 ENCOUNTER — Institutional Professional Consult (permissible substitution): Payer: BC Managed Care – PPO | Admitting: Internal Medicine

## 2012-03-26 ENCOUNTER — Institutional Professional Consult (permissible substitution): Payer: BC Managed Care – PPO | Admitting: Internal Medicine

## 2012-04-01 ENCOUNTER — Encounter: Payer: BC Managed Care – PPO | Admitting: Cardiology

## 2012-04-01 NOTE — Progress Notes (Signed)
HPI: Pleasant female for fu of pleuropericarditis. Patient has a long history of achalasia. She was admitted in February of 2013 for aspiration pneumonia. The patient had thoracentesis of a left effusion that apparently was unrevealing. Echocardiogram in February of 2013 showed an ejection fraction of 50-55% and a small pericardial effusion. Readmitted in February with complaints of chest pain. She apparently had repeat thoracentesis. 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 She ultimately had myotomy for her achalasia. She was seen in emergency room on March 18 and a CT scan was performed following an elevated d-dimer. This showed no pulmonary embolus. There is a small left pleural effusion with compressive atelectasis in the left lower lobe. There was a small stable pericardial effusion. 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 We did add Indocin. A TSH was normal. Repeat echocardiogram on 11/29/2011 showed an ejection fraction of 50% and mild biatrial enlargement. There was a small pericardial effusion. Her symptoms improved with Indocin. I last saw her in June of 2013. She was having recurrent symptoms and we resumed Indocin. Chest x-ray showed cardiomegaly with vascular congestion. CBC showed a white blood cell count of 15.7, hemoglobin 10.9 with MCV 67.2, and platelets 268. BNP was 60. Echocardiogram repeated in July of 2013 and showed normal LV function. Patient did not followup with pulmonary her primary care. Since last saw her,   Current Outpatient Prescriptions  Medication Sig Dispense Refill  . IBUPROFEN PO Take by mouth as needed.      . indomethacin (INDOCIN) 50 MG capsule Take 1 capsule (50 mg total) by mouth 2 (two) times daily with a meal.  20 capsule  0  . Prenatal Vit-Fe Fumarate-FA (PRENATAL MULTIVITAMIN) TABS Take 1 tablet by mouth daily.         Past Medical History  Diagnosis Date  . GERD  (gastroesophageal reflux disease)   . Achalasia     S/p Heller myotomy 1991 and numerous dilations  . Pleural effusion     Past Surgical History  Procedure Date  . Achalasia surgery 1991    transthoracic Heller myotomy in [place]  . Foreign body removal 10/08/2011    Procedure: FOREIGN BODY REMOVAL;  Surgeon: Petra Kuba, MD;  Location: [facility] ENDOSCOPY;  Service: Endoscopy;  Laterality: N/A;  botox  needed /ja/magod  . Esophagogastroduodenoscopy 10/08/2011    Procedure: ESOPHAGOGASTRODUODENOSCOPY (EGD);  Surgeon: Petra Kuba, MD;  Location: [facility] ENDOSCOPY;  Service: Endoscopy;  Laterality: N/A;  . Esophagogastroduodenoscopy 10/16/2011    Procedure: ESOPHAGOGASTRODUODENOSCOPY (EGD);  Surgeon: Florencia Reasons, MD;  Location: [facility] ENDOSCOPY;  Service: Endoscopy;  Laterality: N/A;  . Foreign body removal 10/16/2011    Procedure: FOREIGN BODY REMOVAL;  Surgeon: Florencia Reasons, MD;  Location: [facility] ENDOSCOPY;  Service: Endoscopy;  Laterality: N/A;  . Cesarean section     x2  . Esophagogastroduodenoscopy 11/06/2011    Procedure: ESOPHAGOGASTRODUODENOSCOPY (EGD);  Surgeon: Shirley Friar, MD;  Location: [facility] ENDOSCOPY;  Service: Endoscopy;  Laterality: N/A;  Idell Pickles myotomy 11/07/2011    Procedure: LAPAROSCOPIC HELLER MYOTOMY;  Surgeon: Valarie Merino, MD;  Location: [facility] ORS;  Service: General;  Laterality: N/A;  Laparoscopic Redo Heller Myotomy   . Tonsillectomy     History   Social History  . Marital Status: Married    Spouse Name: N/A    Number of Children: 2  . Years of Education: N/A   Occupational History  .  [employer]   Social History Main Topics  . Smoking status: Former Smoker -- .5 years    Types: Cigarettes    Quit date: 10/07/1990  . Smokeless tobacco: Never Used   Comment: Smoked for 2 months, nothing heavier  . Alcohol Use: Yes     Rarely  . Drug Use: No  . Sexually Active: Yes   Other Topics Concern  . Not on file   Social History Narrative   Lives at home  with husband, working and goes to school, has 2 kids. Very minimal smoking history, very rare alcohol.     ROS: no fevers or chills, productive cough, hemoptysis, dysphasia, odynophagia, melena, hematochezia, dysuria, hematuria, rash, seizure activity, orthopnea, PND, pedal edema, claudication. Remaining systems are negative.  Physical Exam: Well-developed well-nourished in no acute distress.  Skin is warm and dry.  HEENT is normal.  Neck is supple. No thyromegaly.  Chest is clear to auscultation with normal expansion.  Cardiovascular exam is regular rate and rhythm.  Abdominal exam nontender or distended. No masses palpated. Extremities show no edema. neuro grossly intact  ECG     This encounter was created in error - please disregard.

## 2012-04-22 ENCOUNTER — Ambulatory Visit: Payer: Self-pay | Admitting: Family Medicine

## 2012-09-24 ENCOUNTER — Ambulatory Visit (INDEPENDENT_AMBULATORY_CARE_PROVIDER_SITE_OTHER): Payer: BC Managed Care – PPO | Admitting: Family Medicine

## 2012-09-24 ENCOUNTER — Ambulatory Visit: Payer: BC Managed Care – PPO

## 2012-09-24 VITALS — BP 123/83 | HR 82 | Temp 98.7°F | Resp 16 | Ht 69.0 in | Wt 217.0 lb

## 2012-09-24 DIAGNOSIS — M109 Gout, unspecified: Secondary | ICD-10-CM

## 2012-09-24 DIAGNOSIS — M7742 Metatarsalgia, left foot: Secondary | ICD-10-CM

## 2012-09-24 DIAGNOSIS — M79672 Pain in left foot: Secondary | ICD-10-CM

## 2012-09-24 DIAGNOSIS — M79609 Pain in unspecified limb: Secondary | ICD-10-CM

## 2012-09-24 DIAGNOSIS — M7750 Other enthesopathy of unspecified foot: Secondary | ICD-10-CM

## 2012-09-24 LAB — POCT CBC
Granulocyte percent: 71.5 %G (ref 37–80)
HCT, POC: 38.8 % (ref 37.7–47.9)
Hemoglobin: 12 g/dL — AB (ref 12.2–16.2)
Lymph, poc: 2.7 (ref 0.6–3.4)
MCH, POC: 20.9 pg — AB (ref 27–31.2)
MCHC: 30.9 g/dL — AB (ref 31.8–35.4)
MCV: 67.7 fL — AB (ref 80–97)
MID (cbc): 0.5 (ref 0–0.9)
MPV: 8.8 fL (ref 0–99.8)
POC Granulocyte: 8 — AB (ref 2–6.9)
POC LYMPH PERCENT: 24 %L (ref 10–50)
POC MID %: 4.5 %M (ref 0–12)
Platelet Count, POC: 383 10*3/uL (ref 142–424)
RBC: 5.73 M/uL — AB (ref 4.04–5.48)
RDW, POC: 16 %
WBC: 11.2 10*3/uL — AB (ref 4.6–10.2)

## 2012-09-24 LAB — POCT SEDIMENTATION RATE: POCT SED RATE: 12 mm/hr (ref 0–22)

## 2012-09-24 LAB — URIC ACID: Uric Acid, Serum: 6.7 mg/dL (ref 2.4–7.0)

## 2012-09-24 NOTE — Patient Instructions (Addendum)
1. Gout  POCT CBC, POCT SEDIMENTATION RATE, Uric acid, DG Foot Complete Left  2. Foot pain, left  DG Foot Complete Left, Ambulatory referral to Orthopedic Surgery  3. Metatarsalgia of left foot  Ambulatory referral to Orthopedic Surgery      1. TAKE IBUPROFEN 800MG  THREE TIMES DAILY FOR NEXT WEEK. 2.  ELEVATE FOOT AT REST. 3.  ICE FOOT FOR 15 MINUTES TWICE DAILY FOR NEXT WEEK. 4.  AVOID WEARING HIGH HEELED SHOES FOR NEXT TWO WEEKS. 5.  WEAR SUPPORTIVE TENNIS SHOE OR POST-OP SHOE FOR NEXT 1-2 WEEKS. 6.  IF NO IMPROVEMENT IN TWO WEEKS, RECOMMEND EVALUATION BY FOOT SPECIALIST; WE WILL CONTACT YOU IN UPCOMING WEEK REGARDING APPOINTMENT WITH FOOT SPECIALIST.

## 2012-09-24 NOTE — Progress Notes (Signed)
[address]   [place], [place]  [address]   [phone]  Subjective:    Patient ID: [name], female    DOB: [birth year], 42 y.o.   MRN: [id number]  HPIThis 42 y.o. female presents for evaluation of L foot swelling for one week.  Onset one week ago.  No injury or trauma.  Located L first MTP region.  +swollen.  No history of gout but strong family history of gout (mother, sibling).  No redness to skin.  Mild warmth to touch.  Worse with ambulation.  No medication other than Ibuprofen 800mg  prn.  No icing or heat.  No elevation.  No change in exercise regimen; no jogging/running. Wears heels.  Pain mostly between first and second MTP regions.  No skin lesion or breakdown.  No fever/chills/sweats.  Has desk job.   Review of Systems  Constitutional: Negative for fever, chills, diaphoresis and fatigue.  Cardiovascular: Negative for leg swelling.  Musculoskeletal: Positive for myalgias, joint swelling and arthralgias.  Skin: Negative for color change, pallor, rash and wound.        Past Medical History  Diagnosis Date  . GERD (gastroesophageal reflux disease)   . Achalasia     S/p Heller myotomy 1991 and numerous dilations  . Pleural effusion     Past Surgical History  Procedure Date  . Achalasia surgery 1991    transthoracic Heller myotomy in [place]  . Foreign body removal 10/08/2011    Procedure: FOREIGN BODY REMOVAL;  Surgeon: Petra Kuba, MD;  Location: [facility] ENDOSCOPY;  Service: Endoscopy;  Laterality: N/A;  botox  needed /ja/magod  . Esophagogastroduodenoscopy 10/08/2011    Procedure: ESOPHAGOGASTRODUODENOSCOPY (EGD);  Surgeon: Petra Kuba, MD;  Location: [facility] ENDOSCOPY;  Service: Endoscopy;  Laterality: N/A;  . Esophagogastroduodenoscopy 10/16/2011    Procedure: ESOPHAGOGASTRODUODENOSCOPY (EGD);  Surgeon: Florencia Reasons, MD;  Location: [facility] ENDOSCOPY;  Service: Endoscopy;  Laterality: N/A;  . Foreign body removal 10/16/2011    Procedure: FOREIGN BODY REMOVAL;  Surgeon: Florencia Reasons, MD;  Location: [facility] ENDOSCOPY;  Service: Endoscopy;  Laterality: N/A;  . Cesarean section     x2  . Esophagogastroduodenoscopy 11/06/2011    Procedure: ESOPHAGOGASTRODUODENOSCOPY (EGD);  Surgeon: Shirley Friar, MD;  Location: [facility] ENDOSCOPY;  Service: Endoscopy;  Laterality: N/A;  Idell Pickles myotomy 11/07/2011    Procedure: LAPAROSCOPIC HELLER MYOTOMY;  Surgeon: Valarie Merino, MD;  Location: [facility] ORS;  Service: General;  Laterality: N/A;  Laparoscopic Redo Heller Myotomy   . Tonsillectomy     Prior to Admission medications   Medication Sig Start Date End Date Taking? Authorizing Provider  Prenatal Vit-Fe Fumarate-FA (PRENATAL MULTIVITAMIN) TABS Take 1 tablet by mouth daily.   Yes Historical Provider, MD  IBUPROFEN PO Take by mouth as needed.    Historical Provider, MD    No Known Allergies  History   Social History  . Marital Status: Married    Spouse Name: N/A    Number of Children: 2  . Years of Education: N/A   Occupational History  .  [employer]   Social History Main Topics  . Smoking status: Former Smoker -- .5 years    Types: Cigarettes    Quit date: 10/07/1990  . Smokeless tobacco: Never Used     Comment: Smoked for 2 months, nothing heavier  . Alcohol Use: Yes     Comment: Rarely  . Drug Use: No  . Sexually Active: Yes   Other Topics Concern  .  Not on file   Social History Narrative   Lives at home with husband, working and goes to school, has 2 kids. Very minimal smoking history, very rare alcohol.     Family History  Problem Relation Age of Onset  . Cancer Paternal Grandmother     breast  . Hypertension Mother   . Anesthesia problems Neg Hx   . Hypotension Neg Hx   . Malignant hyperthermia Neg Hx   . Pseudochol deficiency Neg Hx     Objective:   Physical Exam  Nursing note and vitals reviewed. Constitutional: She is oriented to person, place, and time. She appears well-developed and well-nourished. No distress.    Musculoskeletal:       Left ankle: She exhibits normal range of motion, no swelling, no ecchymosis and no deformity. no tenderness. No lateral malleolus, no medial malleolus, no head of 5th metatarsal and no proximal fibula tenderness found. Achilles tendon normal. Achilles tendon exhibits no pain, no defect and normal Thompson's test results.       Left foot: She exhibits normal range of motion, no tenderness, no bony tenderness, no swelling, normal capillary refill, no crepitus, no deformity and no laceration.       L FOOT:  NO SWELLING; MINIMAL TTP FIRST MTP JOINT; MILD TTP BETWEEN FIRST AND SECOND METATARSAL REGION.  +TTP METATARSAL SQUEEZE.  MILD TTP HORIZONTAL ARCH.  MINIMAL WARMTH TO SKIN AT MTP FIRST.  NO ERYTHEMA TO SKIN.  Neurological: She is alert and oriented to person, place, and time. No sensory deficit.  Skin: Skin is warm and dry. No rash noted. She is not diaphoretic. No erythema. No pallor.  Psychiatric: She has a normal mood and affect. Her behavior is normal.   Results for orders placed in visit on 09/24/12  POCT CBC      Component Value Range   WBC 11.2 (*) 4.6 - 10.2 K/uL   Lymph, poc 2.7  0.6 - 3.4   POC LYMPH PERCENT 24.0  10 - 50 %L   MID (cbc) 0.5  0 - 0.9   POC MID % 4.5  0 - 12 %M   POC Granulocyte 8.0 (*) 2 - 6.9   Granulocyte percent 71.5  37 - 80 %G   RBC 5.73 (*) 4.04 - 5.48 M/uL   Hemoglobin 12.0 (*) 12.2 - 16.2 g/dL   HCT, POC 96.0  45.4 - 47.9 %   MCV 67.7 (*) 80 - 97 fL   MCH, POC 20.9 (*) 27 - 31.2 pg   MCHC 30.9 (*) 31.8 - 35.4 g/dL   RDW, POC 09.8     Platelet Count, POC 383  142 - 424 K/uL   MPV 8.8  0 - 99.8 fL    [facility] reading (PRIMARY) by  Dr. Katrinka Blazing.  L FOOT:  +BIFID SESAMOID VERSUS FRACTURE; NO OTHER ACUTE ABNORMALITY.    Assessment & Plan:   1. Gout  POCT CBC, POCT SEDIMENTATION RATE, Uric acid, DG Foot Complete Left  2. Foot pain, left  DG Foot Complete Left, Ambulatory referral to Orthopedic Surgery  3. Metatarsalgia of left foot   Ambulatory referral to Orthopedic Surgery     1.  L foot pain: New.  Ddx includes metatarsalgia, Morton's neuroma, acute gouty attack, sesamoid pathology.  Recommend Ibuprofen 800mg  tid scheduled for next week; also provided with post-op shoe for ambulation. 2.  Metatarsalgia L foot:  New.  Ice, elevate, NSAIDs; fitted with post-op shoe for ambulation.  Avoid wearing high-heels.  Referral  to ortho/Bednards placed; will warrant ortho evaluation if no improvement in two weeks.  Gout less likely based on exam.

## 2012-09-25 ENCOUNTER — Telehealth: Payer: Self-pay

## 2012-09-25 NOTE — Telephone Encounter (Signed)
Pt calling to see if labs were in please call [phone]

## 2012-09-25 NOTE — Telephone Encounter (Signed)
Please review. Thanks!  

## 2012-09-25 NOTE — Telephone Encounter (Signed)
Results sent to lab pool to contact pt with results. KMS

## 2012-10-27 IMAGING — CT CT ANGIO CHEST
2 of 6 series · 19 of 36 positions shown · IV contrast (APPLIED)
Comparison: Portable chest x-ray of 10/29/2011 and CT chest of
10/18/2011

CLINICAL DATA: Shortness of breath, mid chest pressure, high D-
dimer

CT ANGIOGRAPHY CHEST
TECHNIQUE: Multidetector CT imaging of the chest using the
standard protocol during bolus administration of intravenous
contrast. Multiplanar reconstructed images including MIPs were
obtained and reviewed to evaluate the vascular anatomy.
Contrast: 100mL OMNIPAQUE IOHEXOL 300 MG/ML IV SOLN

[Series 6: pe thins @ 1mm · axial · 0.79mm/px · z∈[-259,-29]mm · 18 of 256 slices shown]
[im 13/256  lung]
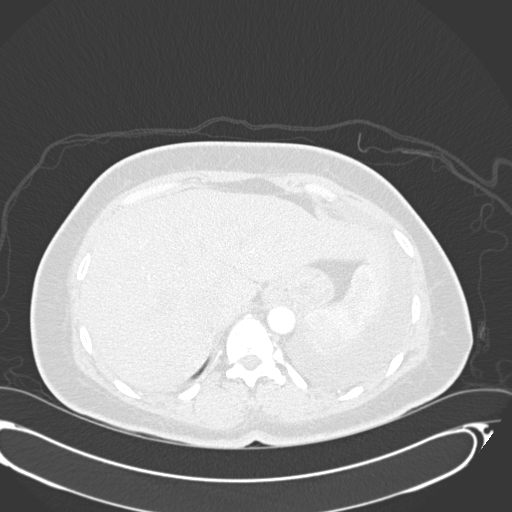
[im 26/256  mediastinal]
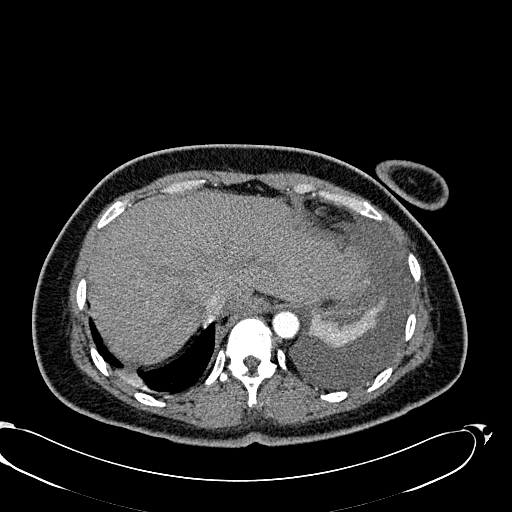
[im 39/256  lung]
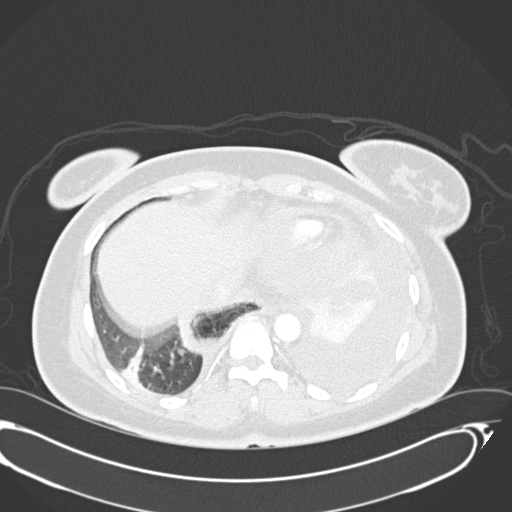
[im 52/256  mediastinal]
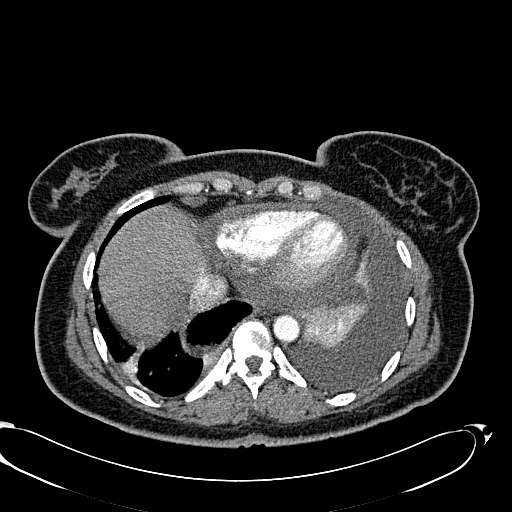
[im 64/256  lung]
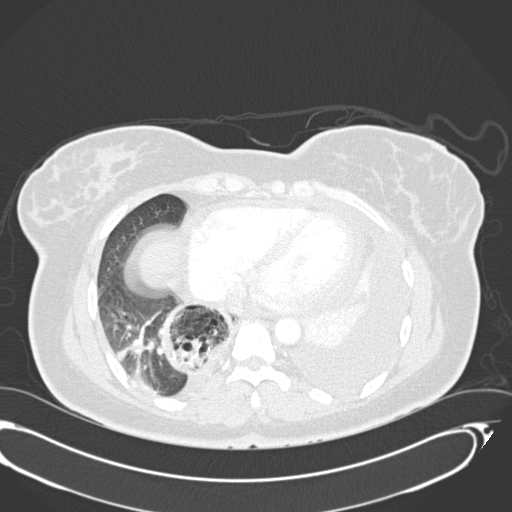
[im 77/256  mediastinal]
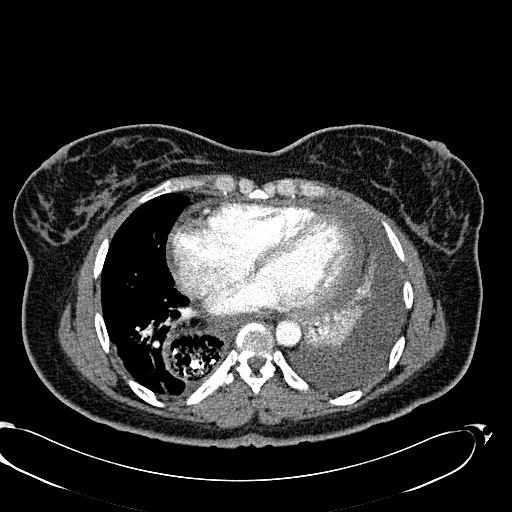
[im 90/256  lung]
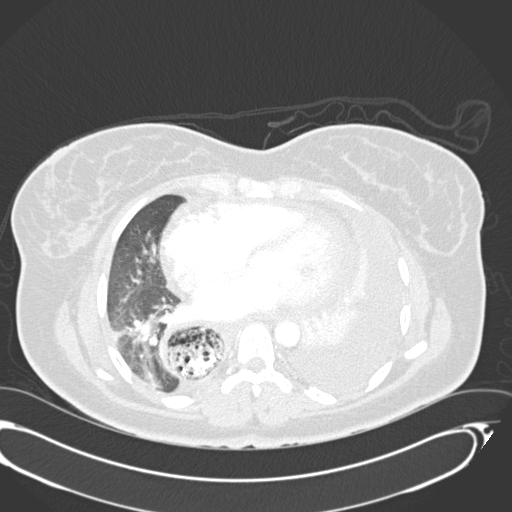
[im 103/256  mediastinal]
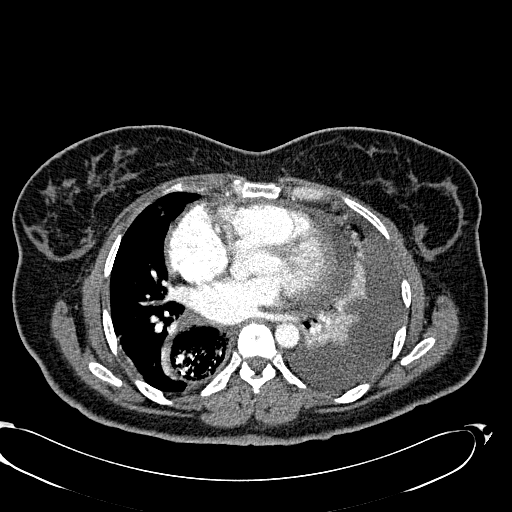
[im 115/256  lung]
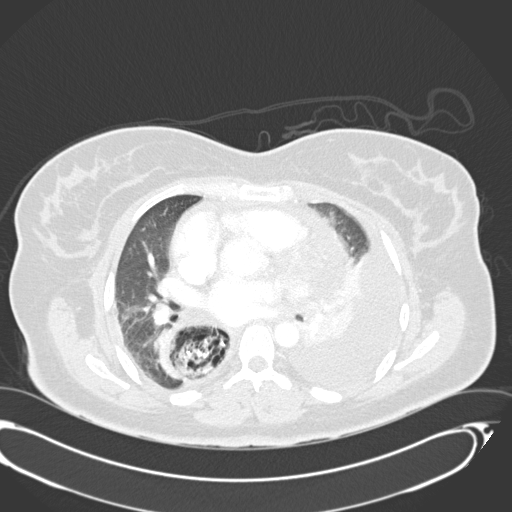
[im 141/256  mediastinal]
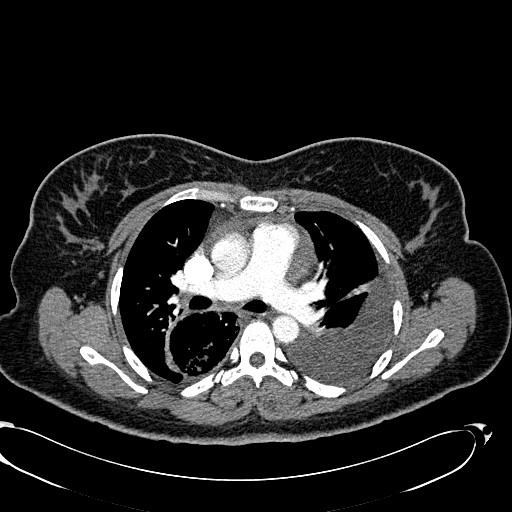
[im 154/256  lung]
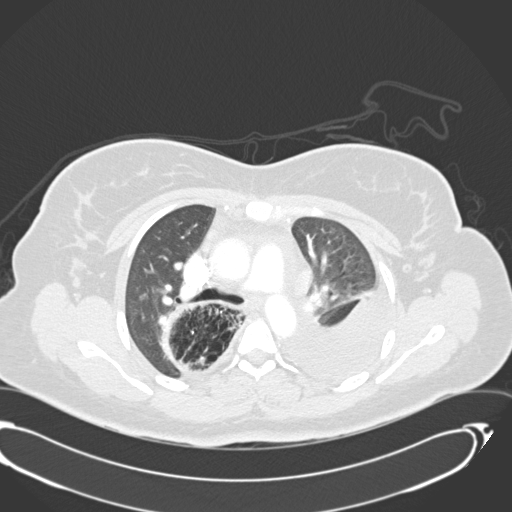
[im 166/256  mediastinal]
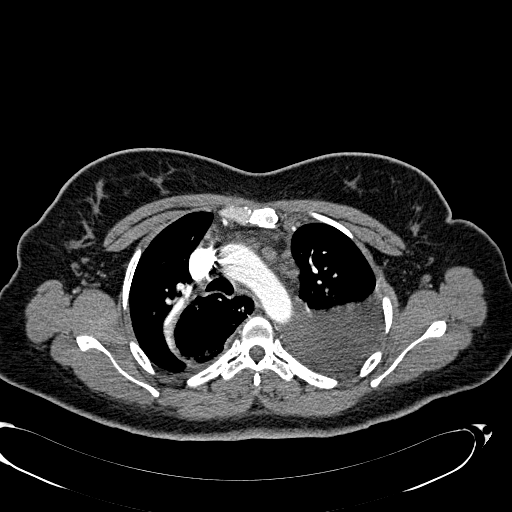
[im 179/256  lung]
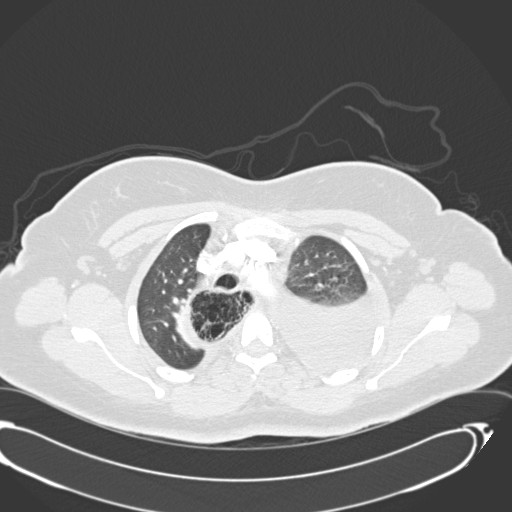
[im 192/256  mediastinal]
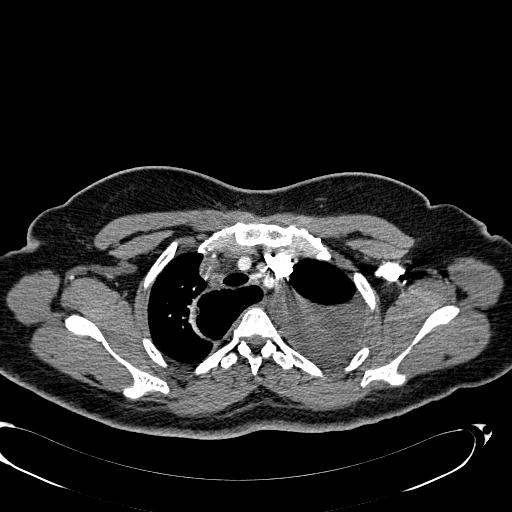
[im 205/256  lung]
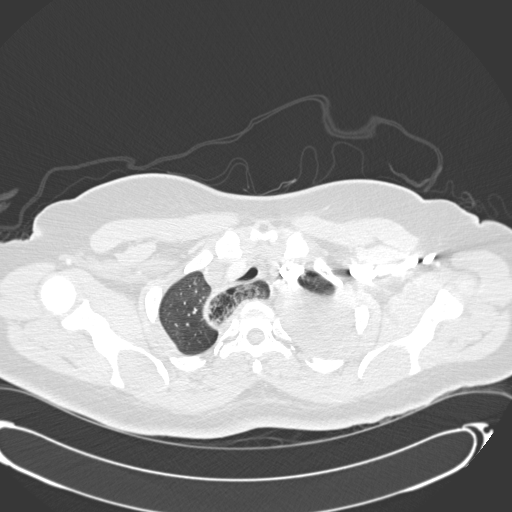
[im 217/256  mediastinal]
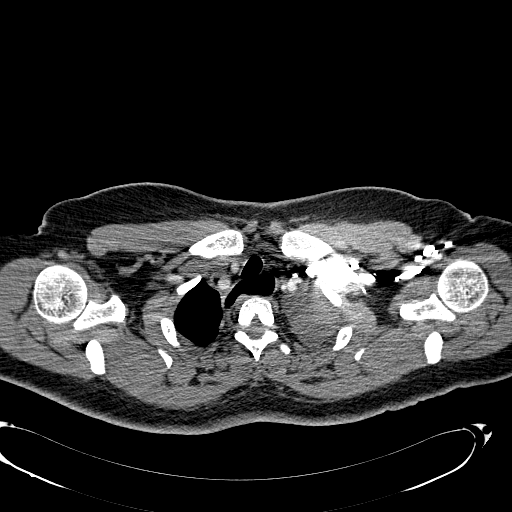
[im 230/256  lung]
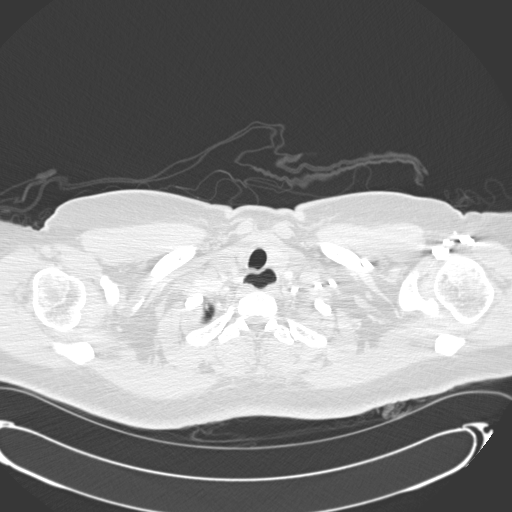
[im 243/256  mediastinal]
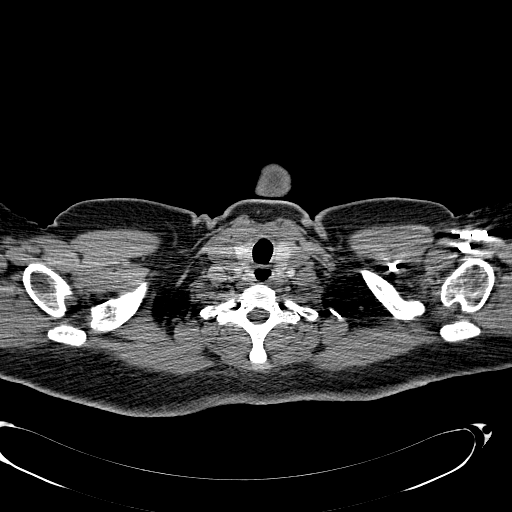

[Series 602: coronal mpr · coronal · 0.79mm/px · 1 of 123 slices shown]
[im 62/123  mediastinal]
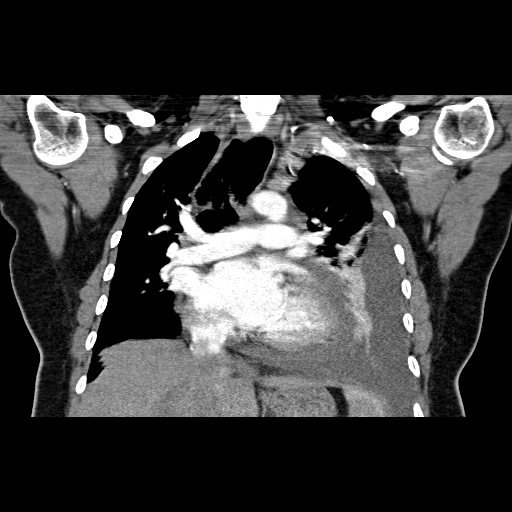

[19 of 36 positions shown; findings below may reference images not displayed]

FINDINGS: The pulmonary arteries opacify well and there is no
evidence of acute pulmonary embolism.  Some respiratory motion does
obscure detail in the lung bases.  The thoracic aorta opacifies
with no acute abnormality.  There are mediastinal nodes present
which are not pathologically enlarged.  There is cardiomegaly
present, and there does appear to be a small pericardial effusion
present.  There is a moderately large left pleural effusion present
with atelectasis of much of the left lower lobe.  No central
endobronchial lesion is seen.  This atelectasis may be compressive
as a result of the left pleural effusion. The esophagus is again
noted to be diffusely dilated with food particles, in this patient
with history of achalasia.  This appearance is unchanged compared
to prior CTs.

On the lung window images compressive atelectasis of the left lower
lobe is noted.  No lung nodule or infiltrate is seen.  The
moderately large left pleural effusion again is noted.  No bony
abnormality is seen.
IMPRESSION: 1.  No evidence of acute pulmonary embolism.
2.  Moderately large left pleural effusion with probable
compressive atelectasis of the left lower lobe.  No central
endobronchial lesion is seen.
3.  Cardiomegaly and small pericardial effusion.
4.  Changes of achalasia again are noted.

## 2012-11-02 IMAGING — CR DG CHEST 2V
2 series · 2 of 2 positions shown · non-contrast
Comparison: 11/02/2011

CLINICAL DATA: Left pleural effusion.  Eight Shrey.

CHEST - 2 VIEW

[w chest pa]
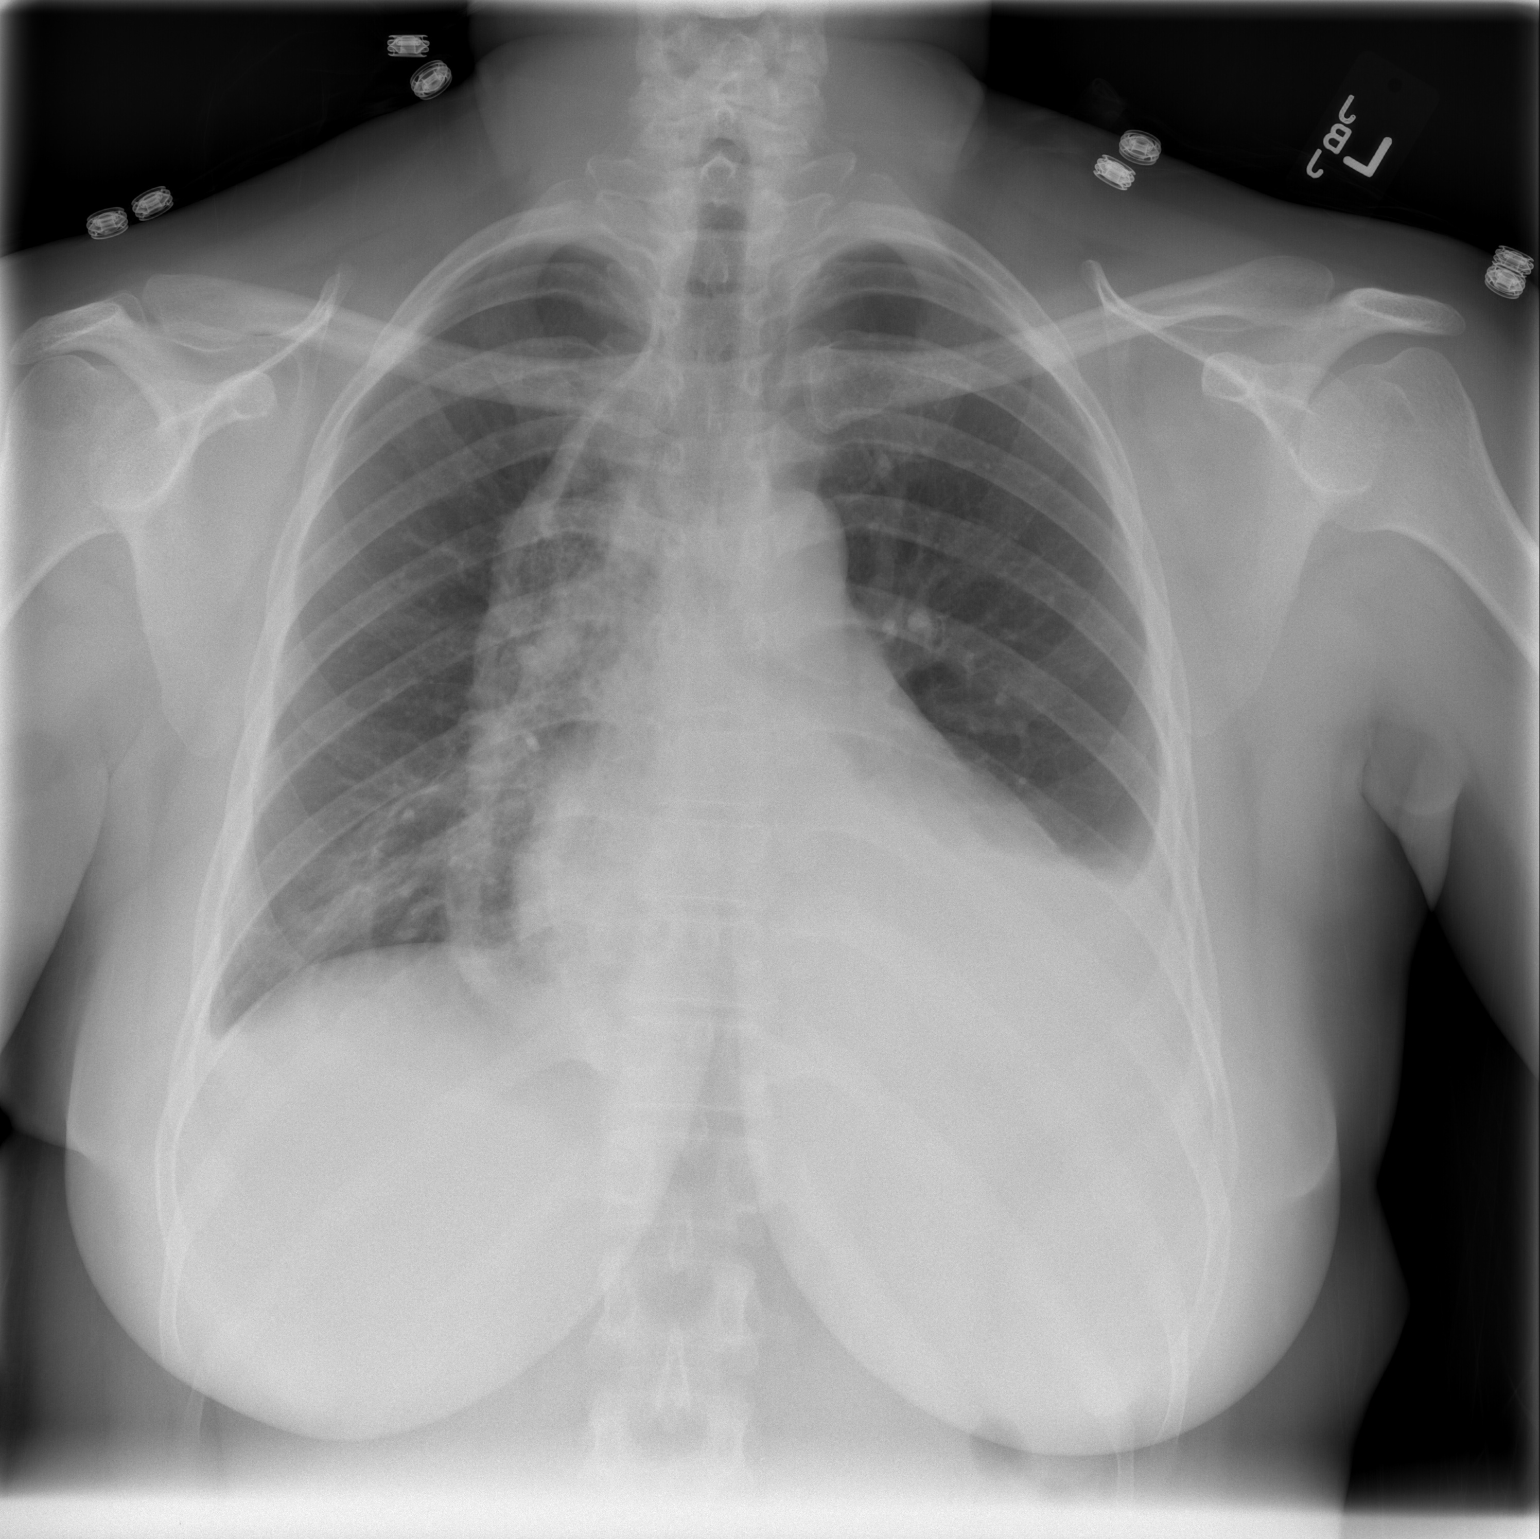

[w chest lat]
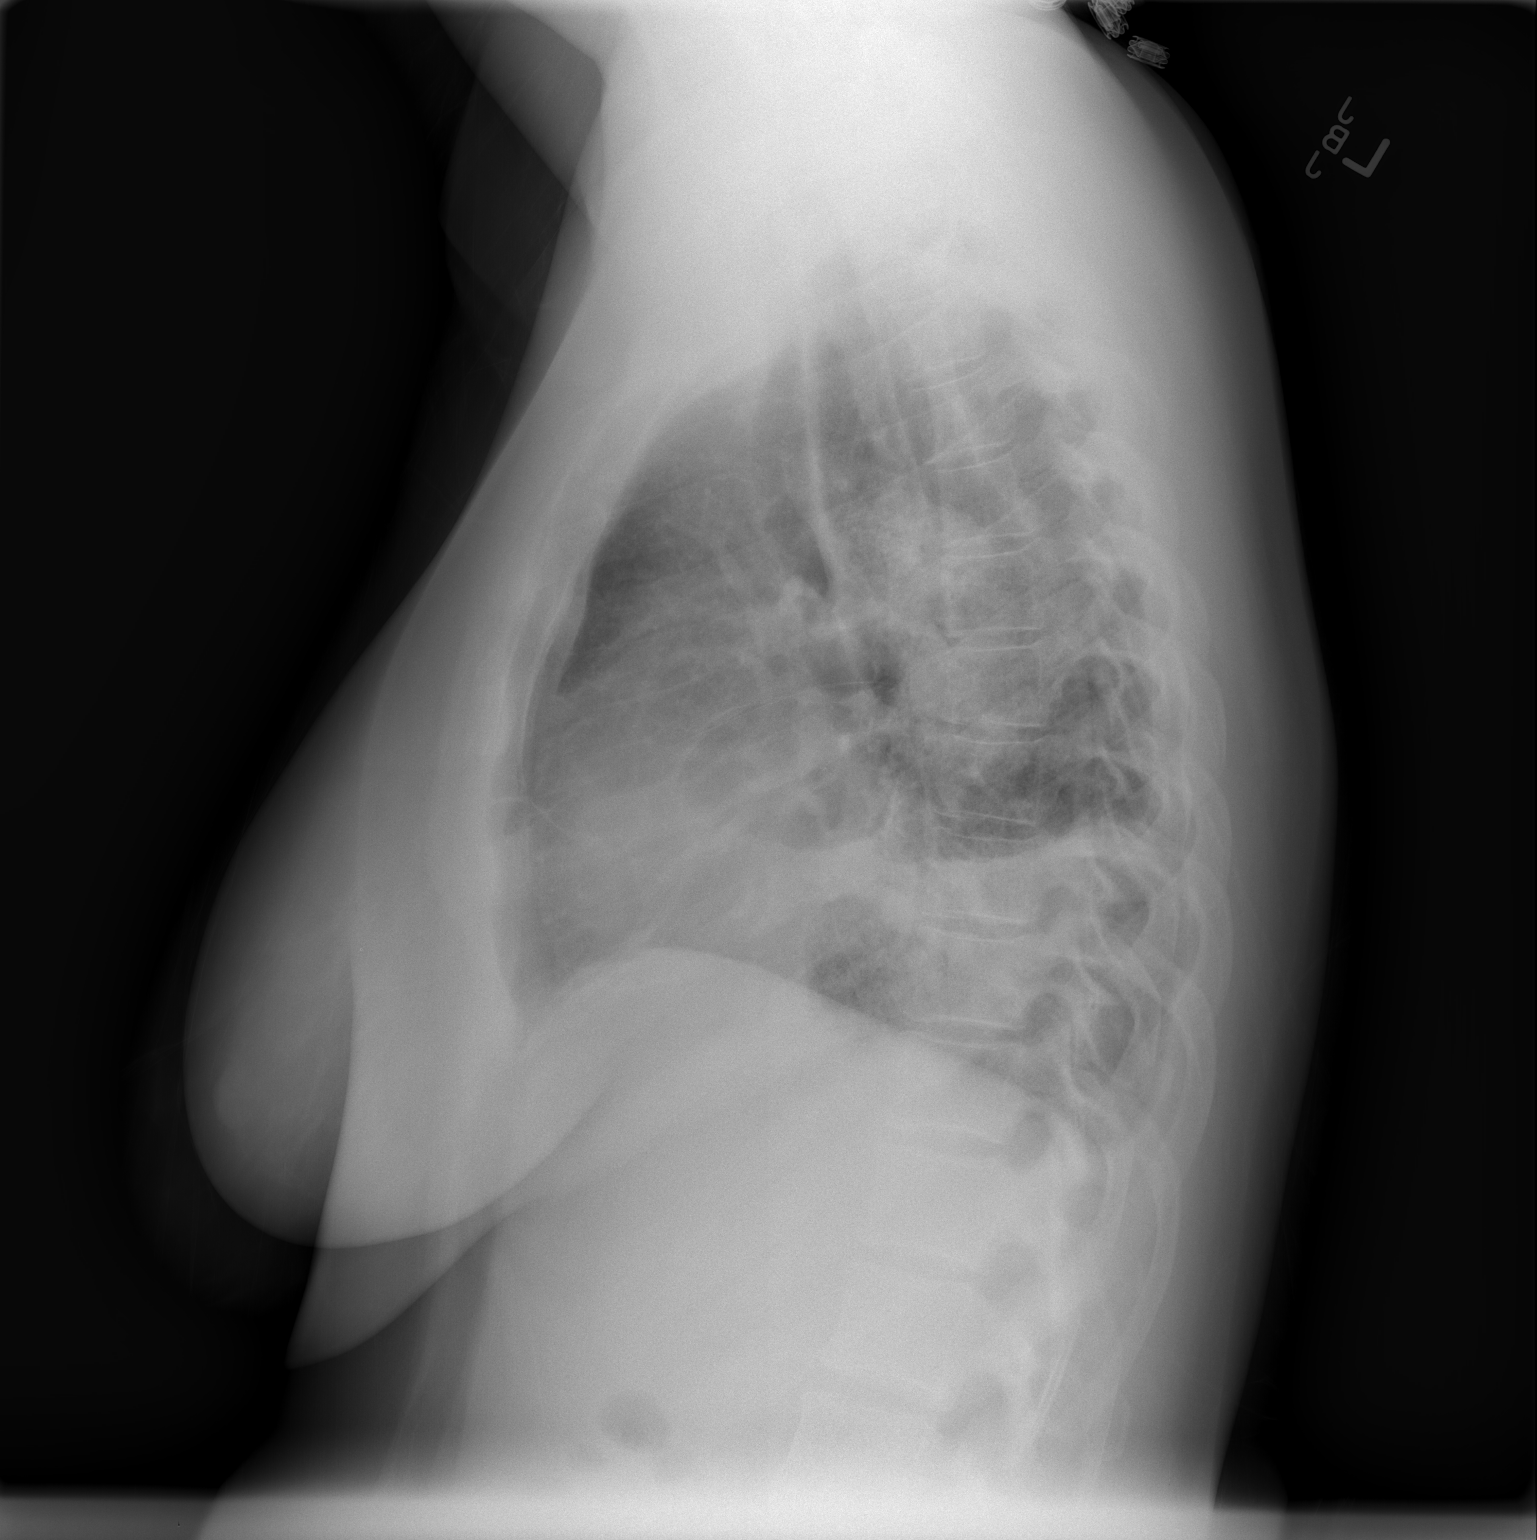

[2 of 2 positions shown; findings below may reference images not displayed]

FINDINGS: Heart size remains normal.  There has been further
accumulation of pleural fluid on the left with left base
atelectasis.  There is mild atelectasis at the right base.
Mediastinal shadow related to dilated esophagus containing fluid
and air remains evident, consistent with the patient's history of
achalasia.  No significant bony finding.
IMPRESSION: Further accumulation of pleural fluid on the left with more left
base atelectasis.

Mild patchy volume loss right base.

Findings of achalasia.

## 2013-11-19 ENCOUNTER — Encounter (HOSPITAL_COMMUNITY): Payer: Self-pay | Admitting: Pharmacist

## 2013-11-20 ENCOUNTER — Other Ambulatory Visit: Payer: Self-pay | Admitting: Obstetrics & Gynecology

## 2013-11-24 ENCOUNTER — Encounter (HOSPITAL_COMMUNITY)
Admission: RE | Admit: 2013-11-24 | Discharge: 2013-11-24 | Disposition: A | Discharge status: Home / Self Care | Payer: BC Managed Care – PPO | Source: Ambulatory Visit | Attending: Obstetrics & Gynecology | Admitting: Obstetrics & Gynecology

## 2013-11-24 ENCOUNTER — Encounter (HOSPITAL_COMMUNITY): Payer: Self-pay

## 2013-11-24 DIAGNOSIS — Z01812 Encounter for preprocedural laboratory examination: Secondary | ICD-10-CM | POA: Insufficient documentation

## 2013-11-24 NOTE — Patient Instructions (Addendum)
   Your procedure is scheduled on:  Wednesday, Mar 25  Enter through the [employer] [facility] at: [place] up the phone at the desk and dial [phone] and inform us of your arrival.  Please call this number if you have any problems the morning of surgery: [phone]  Remember: Do not eat food after midnight: Tuesday Do not drink clear liquids after: 7 AM Wednesday, day of surgery Take these medicines the morning of surgery with a SIP OF WATER:  None  Do not wear jewelry, make-up, or FINGER nail polish No metal in your hair or on your body. Do not wear lotions, powders, perfumes.  You may wear deodorant.  Do not bring valuables to the hospital. Contacts, dentures or bridgework may not be worn into surgery.  Leave suitcase in the car. After Surgery it may be brought to your room. For patients being admitted to the hospital, checkout time is 11:00am the day of discharge.  Home with husband Matt cell [phone]

## 2013-11-25 LAB — TYPE AND SCREEN
ABO/RH(D): A POS
Antibody Screen: NEGATIVE

## 2013-11-25 LAB — ABO/RH: ABO/RH(D): A POS

## 2013-11-25 LAB — CBC
HCT: 35.3 % — ABNORMAL LOW (ref 36.0–46.0)
Hemoglobin: 11.6 g/dL — ABNORMAL LOW (ref 12.0–15.0)
MCH: 21.6 pg — ABNORMAL LOW (ref 26.0–34.0)
MCHC: 32.9 g/dL (ref 30.0–36.0)
MCV: 65.6 fL — ABNORMAL LOW (ref 78.0–100.0)
Platelets: 301 10*3/uL (ref 150–400)
RBC: 5.38 MIL/uL — ABNORMAL HIGH (ref 3.87–5.11)
RDW: 16.5 % — ABNORMAL HIGH (ref 11.5–15.5)
WBC: 10.3 10*3/uL (ref 4.0–10.5)

## 2013-12-02 ENCOUNTER — Encounter (HOSPITAL_COMMUNITY)
Admission: RE | Disposition: A | Discharge status: Home / Self Care | Payer: Self-pay | Source: Ambulatory Visit | Attending: Obstetrics & Gynecology

## 2013-12-02 ENCOUNTER — Ambulatory Visit (HOSPITAL_COMMUNITY)
Admission: RE | Admit: 2013-12-02 | Discharge: 2013-12-03 | Disposition: A | Discharge status: Home / Self Care | Payer: BC Managed Care – PPO | Source: Ambulatory Visit | Attending: Obstetrics & Gynecology | Admitting: Obstetrics & Gynecology

## 2013-12-02 ENCOUNTER — Ambulatory Visit (HOSPITAL_COMMUNITY): Payer: BC Managed Care – PPO | Admitting: Anesthesiology

## 2013-12-02 ENCOUNTER — Encounter (HOSPITAL_COMMUNITY): Payer: BC Managed Care – PPO | Admitting: Anesthesiology

## 2013-12-02 ENCOUNTER — Encounter (HOSPITAL_COMMUNITY): Payer: Self-pay | Admitting: Anesthesiology

## 2013-12-02 DIAGNOSIS — E669 Obesity, unspecified: Secondary | ICD-10-CM | POA: Insufficient documentation

## 2013-12-02 DIAGNOSIS — Z87891 Personal history of nicotine dependence: Secondary | ICD-10-CM | POA: Insufficient documentation

## 2013-12-02 DIAGNOSIS — N920 Excessive and frequent menstruation with regular cycle: Secondary | ICD-10-CM | POA: Insufficient documentation

## 2013-12-02 DIAGNOSIS — K219 Gastro-esophageal reflux disease without esophagitis: Secondary | ICD-10-CM | POA: Insufficient documentation

## 2013-12-02 DIAGNOSIS — D259 Leiomyoma of uterus, unspecified: Secondary | ICD-10-CM | POA: Insufficient documentation

## 2013-12-02 DIAGNOSIS — N949 Unspecified condition associated with female genital organs and menstrual cycle: Secondary | ICD-10-CM | POA: Insufficient documentation

## 2013-12-02 DIAGNOSIS — Z9889 Other specified postprocedural states: Secondary | ICD-10-CM

## 2013-12-02 HISTORY — PX: BILATERAL SALPINGECTOMY: SHX5743

## 2013-12-02 HISTORY — PX: ROBOTIC ASSISTED TOTAL HYSTERECTOMY: SHX6085

## 2013-12-02 LAB — TYPE AND SCREEN
ABO/RH(D): A POS
Antibody Screen: NEGATIVE

## 2013-12-02 LAB — PREGNANCY, URINE: Preg Test, Ur: NEGATIVE

## 2013-12-02 SURGERY — ROBOTIC ASSISTED TOTAL HYSTERECTOMY
Anesthesia: General | Site: Abdomen

## 2013-12-02 MED ORDER — HYDROMORPHONE HCL PF 1 MG/ML IJ SOLN
INTRAMUSCULAR | Status: AC
  Filled 2013-12-02: qty 1

## 2013-12-02 MED ORDER — ONDANSETRON HCL 4 MG/2ML IJ SOLN
INTRAMUSCULAR | Status: DC | PRN
Start: 2013-12-02 — End: 2013-12-02
  Administered 2013-12-02: 4 mg via INTRAVENOUS

## 2013-12-02 MED ORDER — LIDOCAINE HCL (CARDIAC) 20 MG/ML IV SOLN
INTRAVENOUS | Status: AC
  Filled 2013-12-02: qty 5

## 2013-12-02 MED ORDER — ROCURONIUM BROMIDE 100 MG/10ML IV SOLN
INTRAVENOUS | Status: AC
  Filled 2013-12-02: qty 1

## 2013-12-02 MED ORDER — DEXAMETHASONE SODIUM PHOSPHATE 10 MG/ML IJ SOLN
INTRAMUSCULAR | Status: DC | PRN
  Administered 2013-12-02: 8 mg via INTRAVENOUS

## 2013-12-02 MED ORDER — NEOSTIGMINE METHYLSULFATE 1 MG/ML IJ SOLN
INTRAMUSCULAR | Status: DC | PRN
  Administered 2013-12-02: 4 mg via INTRAVENOUS

## 2013-12-02 MED ORDER — FENTANYL CITRATE 0.05 MG/ML IJ SOLN
INTRAMUSCULAR | Status: AC
  Filled 2013-12-02: qty 2

## 2013-12-02 MED ORDER — HYDROMORPHONE HCL PF 1 MG/ML IJ SOLN
INTRAMUSCULAR | Status: DC | PRN
  Administered 2013-12-02 (×2): 1 mg via INTRAVENOUS

## 2013-12-02 MED ORDER — SCOPOLAMINE 1 MG/3DAYS TD PT72
MEDICATED_PATCH | TRANSDERMAL | Status: AC
  Administered 2013-12-02: 1.5 mg via TRANSDERMAL
  Filled 2013-12-02: qty 1

## 2013-12-02 MED ORDER — MIDAZOLAM HCL 2 MG/2ML IJ SOLN
INTRAMUSCULAR | Status: DC | PRN
  Administered 2013-12-02: 2 mg via INTRAVENOUS

## 2013-12-02 MED ORDER — MEPERIDINE HCL 25 MG/ML IJ SOLN: 6.2500 mg | INTRAMUSCULAR | Status: DC | PRN

## 2013-12-02 MED ORDER — LACTATED RINGERS IV SOLN
INTRAVENOUS | Status: DC
  Administered 2013-12-02 (×2): via INTRAVENOUS

## 2013-12-02 MED ORDER — PROPOFOL 10 MG/ML IV BOLUS
INTRAVENOUS | Status: DC | PRN
  Administered 2013-12-02: 180 mg via INTRAVENOUS

## 2013-12-02 MED ORDER — OXYCODONE-ACETAMINOPHEN 5-325 MG PO TABS
1.0000 | ORAL_TABLET | ORAL | Status: DC | PRN
  Administered 2013-12-03: 2 via ORAL
  Filled 2013-12-02: qty 2

## 2013-12-02 MED ORDER — DEXAMETHASONE SODIUM PHOSPHATE 10 MG/ML IJ SOLN
INTRAMUSCULAR | Status: AC
  Filled 2013-12-02: qty 1

## 2013-12-02 MED ORDER — BUPIVACAINE HCL (PF) 0.25 % IJ SOLN
INTRAMUSCULAR | Status: AC
  Filled 2013-12-02: qty 30

## 2013-12-02 MED ORDER — FENTANYL CITRATE 0.05 MG/ML IJ SOLN
INTRAMUSCULAR | Status: DC | PRN
  Administered 2013-12-02 (×2): 100 ug via INTRAVENOUS
  Administered 2013-12-02: 50 ug via INTRAVENOUS
  Administered 2013-12-02: 100 ug via INTRAVENOUS

## 2013-12-02 MED ORDER — BUPIVACAINE HCL (PF) 0.25 % IJ SOLN
INTRAMUSCULAR | Status: DC | PRN
  Administered 2013-12-02: 15 mL

## 2013-12-02 MED ORDER — ROCURONIUM BROMIDE 100 MG/10ML IV SOLN
INTRAVENOUS | Status: DC | PRN
  Administered 2013-12-02: 50 mg via INTRAVENOUS
  Administered 2013-12-02 (×2): 10 mg via INTRAVENOUS
  Administered 2013-12-02: 20 mg via INTRAVENOUS

## 2013-12-02 MED ORDER — LACTATED RINGERS IV SOLN
INTRAVENOUS | Status: DC
  Administered 2013-12-03: 02:00:00 via INTRAVENOUS

## 2013-12-02 MED ORDER — ARTIFICIAL TEARS OP OINT
TOPICAL_OINTMENT | OPHTHALMIC | Status: AC
  Filled 2013-12-02: qty 3.5

## 2013-12-02 MED ORDER — ONDANSETRON HCL 4 MG/2ML IJ SOLN
INTRAMUSCULAR | Status: AC
  Filled 2013-12-02: qty 2

## 2013-12-02 MED ORDER — MIDAZOLAM HCL 2 MG/2ML IJ SOLN
INTRAMUSCULAR | Status: AC
  Filled 2013-12-02: qty 2

## 2013-12-02 MED ORDER — CEFAZOLIN SODIUM-DEXTROSE 2-3 GM-% IV SOLR
INTRAVENOUS | Status: AC
  Filled 2013-12-02: qty 50

## 2013-12-02 MED ORDER — CEFAZOLIN SODIUM-DEXTROSE 2-3 GM-% IV SOLR
2.0000 g | INTRAVENOUS | Status: AC
  Administered 2013-12-02: 2 g via INTRAVENOUS

## 2013-12-02 MED ORDER — IBUPROFEN 600 MG PO TABS: 600.0000 mg | ORAL_TABLET | Freq: Four times a day (QID) | ORAL | Status: DC | PRN

## 2013-12-02 MED ORDER — HYDROMORPHONE HCL PF 1 MG/ML IJ SOLN
INTRAMUSCULAR | Status: AC
  Administered 2013-12-02: 0.5 mg via INTRAVENOUS
  Filled 2013-12-02: qty 1

## 2013-12-02 MED ORDER — SCOPOLAMINE 1 MG/3DAYS TD PT72
1.0000 | MEDICATED_PATCH | TRANSDERMAL | Status: DC
  Administered 2013-12-02: 1.5 mg via TRANSDERMAL

## 2013-12-02 MED ORDER — PROPOFOL 10 MG/ML IV EMUL
INTRAVENOUS | Status: AC
  Filled 2013-12-02: qty 20

## 2013-12-02 MED ORDER — HYDROMORPHONE HCL PF 1 MG/ML IJ SOLN
1.0000 mg | INTRAMUSCULAR | Status: DC | PRN
  Administered 2013-12-02 – 2013-12-03 (×3): 1 mg via INTRAVENOUS
  Filled 2013-12-02 (×3): qty 1

## 2013-12-02 MED ORDER — GLYCOPYRROLATE 0.2 MG/ML IJ SOLN
INTRAMUSCULAR | Status: DC | PRN
  Administered 2013-12-02: .8 mg via INTRAVENOUS
  Administered 2013-12-02: 0.2 mg via INTRAVENOUS

## 2013-12-02 MED ORDER — HYDROMORPHONE HCL PF 1 MG/ML IJ SOLN
0.2500 mg | INTRAMUSCULAR | Status: DC | PRN
  Administered 2013-12-02 (×2): 0.5 mg via INTRAVENOUS

## 2013-12-02 MED ORDER — GLYCOPYRROLATE 0.2 MG/ML IJ SOLN
INTRAMUSCULAR | Status: AC
  Filled 2013-12-02: qty 4

## 2013-12-02 MED ORDER — METOCLOPRAMIDE HCL 5 MG/ML IJ SOLN: 10.0000 mg | Freq: Once | INTRAMUSCULAR | Status: DC | PRN

## 2013-12-02 MED ORDER — FENTANYL CITRATE 0.05 MG/ML IJ SOLN
INTRAMUSCULAR | Status: AC
  Filled 2013-12-02: qty 5

## 2013-12-02 MED ORDER — LIDOCAINE HCL (CARDIAC) 20 MG/ML IV SOLN
INTRAVENOUS | Status: DC | PRN
  Administered 2013-12-02: 50 mg via INTRAVENOUS

## 2013-12-02 MED ORDER — NEOSTIGMINE METHYLSULFATE 1 MG/ML IJ SOLN
INTRAMUSCULAR | Status: AC
  Filled 2013-12-02: qty 1

## 2013-12-02 SURGICAL SUPPLY — 65 items
ADH SKN CLS APL DERMABOND .7 (GAUZE/BANDAGES/DRESSINGS) ×4
BAG URINE DRAINAGE (UROLOGICAL SUPPLIES) ×3 IMPLANT
BARRIER ADHS 3X4 INTERCEED (GAUZE/BANDAGES/DRESSINGS) ×3 IMPLANT
BRR ADH 4X3 ABS CNTRL BYND (GAUZE/BANDAGES/DRESSINGS) ×2
CATH FOLEY 3WAY  5CC 16FR (CATHETERS) ×1
CATH FOLEY 3WAY 5CC 16FR (CATHETERS) ×2 IMPLANT
CLOTH BEACON ORANGE TIMEOUT ST (SAFETY) ×3 IMPLANT
CONT PATH 16OZ SNAP LID 3702 (MISCELLANEOUS) ×3 IMPLANT
COVER MAYO STAND STRL (DRAPES) ×3 IMPLANT
COVER TABLE BACK 60X90 (DRAPES) ×6 IMPLANT
COVER TIP SHEARS 8 DVNC (MISCELLANEOUS) ×2 IMPLANT
COVER TIP SHEARS 8MM DA VINCI (MISCELLANEOUS) ×1
DECANTER SPIKE VIAL GLASS SM (MISCELLANEOUS) ×3 IMPLANT
DERMABOND ADVANCED (GAUZE/BANDAGES/DRESSINGS) ×2
DERMABOND ADVANCED .7 DNX12 (GAUZE/BANDAGES/DRESSINGS) ×4 IMPLANT
DILATOR CANAL MILEX (MISCELLANEOUS) ×1 IMPLANT
DRAPE HUG U DISPOSABLE (DRAPE) ×3 IMPLANT
DRAPE LG THREE QUARTER DISP (DRAPES) ×6 IMPLANT
DRAPE WARM FLUID 44X44 (DRAPE) ×3 IMPLANT
ELECT REM PT RETURN 9FT ADLT (ELECTROSURGICAL) ×3
ELECTRODE REM PT RTRN 9FT ADLT (ELECTROSURGICAL) ×2 IMPLANT
EVACUATOR SMOKE 8.L (FILTER) ×3 IMPLANT
GAUZE VASELINE 3X9 (GAUZE/BANDAGES/DRESSINGS) IMPLANT
GLOVE BIO SURGEON STRL SZ 6.5 (GLOVE) ×3 IMPLANT
GLOVE BIO SURGEON STRL SZ7 (GLOVE) ×3 IMPLANT
GLOVE BIOGEL PI IND STRL 7.0 (GLOVE) ×2 IMPLANT
GLOVE BIOGEL PI INDICATOR 7.0 (GLOVE) ×1
GOWN STRL REUS W/TWL LRG LVL3 (GOWN DISPOSABLE) ×18 IMPLANT
IV STOPCOCK 4 WAY 40  W/Y SET (IV SOLUTION)
IV STOPCOCK 4 WAY 40 W/Y SET (IV SOLUTION) IMPLANT
KIT ACCESSORY DA VINCI DISP (KITS) ×1
KIT ACCESSORY DVNC DISP (KITS) ×2 IMPLANT
LEGGING LITHOTOMY PAIR STRL (DRAPES) ×3 IMPLANT
NEEDLE HYPO 22GX1.5 SAFETY (NEEDLE) IMPLANT
OCCLUDER COLPOPNEUMO (BALLOONS) IMPLANT
PACK LAVH (CUSTOM PROCEDURE TRAY) ×3 IMPLANT
PAD PREP 24X48 CUFFED NSTRL (MISCELLANEOUS) ×6 IMPLANT
PLUG CATH AND CAP STER (CATHETERS) ×3 IMPLANT
PROTECTOR NERVE ULNAR (MISCELLANEOUS) ×6 IMPLANT
SET CYSTO W/LG BORE CLAMP LF (SET/KITS/TRAYS/PACK) IMPLANT
SET IRRIG TUBING LAPAROSCOPIC (IRRIGATION / IRRIGATOR) ×6 IMPLANT
SOLUTION ELECTROLUBE (MISCELLANEOUS) ×3 IMPLANT
SUT VIC AB 0 CT1 27 (SUTURE) ×6
SUT VIC AB 0 CT1 27XBRD ANBCTR (SUTURE) ×4 IMPLANT
SUT VIC AB 4-0 PS2 27 (SUTURE) ×6 IMPLANT
SUT VICRYL 0 UR6 27IN ABS (SUTURE) ×6 IMPLANT
SUT VLOC 180 0 6IN GS21 (SUTURE) ×1 IMPLANT
SUT VLOC 180 0 9IN  GS21 (SUTURE) ×1
SUT VLOC 180 0 9IN GS21 (SUTURE) IMPLANT
SYR 50ML LL SCALE MARK (SYRINGE) ×3 IMPLANT
SYSTEM CONVERTIBLE TROCAR (TROCAR) IMPLANT
TIP RUMI ORANGE 6.7MMX12CM (TIP) IMPLANT
TIP UTERINE 5.1X6CM LAV DISP (MISCELLANEOUS) IMPLANT
TIP UTERINE 6.7X10CM GRN DISP (MISCELLANEOUS) IMPLANT
TIP UTERINE 6.7X6CM WHT DISP (MISCELLANEOUS) IMPLANT
TIP UTERINE 6.7X8CM BLUE DISP (MISCELLANEOUS) IMPLANT
TOWEL OR 17X24 6PK STRL BLUE (TOWEL DISPOSABLE) ×9 IMPLANT
TROCAR 12M 150ML BLUNT (TROCAR) IMPLANT
TROCAR DISP BLADELESS 8 DVNC (TROCAR) ×2 IMPLANT
TROCAR DISP BLADELESS 8MM (TROCAR) ×1
TROCAR XCEL 12X100 BLDLESS (ENDOMECHANICALS) ×3 IMPLANT
TROCAR XCEL NON-BLD 5MMX100MML (ENDOMECHANICALS) ×3 IMPLANT
TUBING FILTER THERMOFLATOR (ELECTROSURGICAL) ×6 IMPLANT
WARMER LAPAROSCOPE (MISCELLANEOUS) ×3 IMPLANT
WATER STERILE IRR 1000ML POUR (IV SOLUTION) ×9 IMPLANT

## 2013-12-02 NOTE — Op Note (Signed)
12/02/2013  4:33 PM  PATIENT:  [name]  43 y.o. female  PRE-OPERATIVE DIAGNOSIS:  Fibroids, Menometrorrhagia, pelvic pain  POST-OPERATIVE DIAGNOSIS:  Fibroids, Menometrorrhagia, pelvic pain  PROCEDURE:  Procedure(s): ROBOTIC ASSISTED TOTAL HYSTERECTOMY WITH BILATERAL SALPINGECTOMY  SURGEON:  Surgeon(s): Princess Bruins, MD Floyce Stakes. Pamala Hurry, MD  ASSISTANTS: Ala Dach MD   ANESTHESIA:   general  PROCEDURE:  Under general anesthesia with endotracheal intubation the patient is in lithotomy position. She is prepped with ChloraPrep on the abdomen and with line on the suprapubic, vulvar and vaginal areas.  The weighted speculum is inserted in the vagina and the anterior lip of the cervix is grasped with a tenaculum.  The hysterometry is at 11 cm so we used a #10 roomy with a medium coring which are put in place without difficulty.  The Foley is inserted in the bladder.  The tenaculum and weighted speculum were removed.  Abdominally we infiltrate Marcaine one quarter plain at the infraumbilical area. We make a 1.5 cm incision with a scalpel at that level. We opened the aponeurosis with Mayo scissors under direct vision and we opened the peritoneum bluntly with a finger.  A pursestring stitch of Vicryl 0 was done on the aponeurosis. The Sheryle Hail was inserted at that level and a pneumoperitoneum was created with CO2. The ports were inserted in a semi-circular configuration with 2 robotic ports on the right and one robotic ports on the lower left with the assistant port a 5 mm ports on the upper left. Marcaine one quarter plain was used at each site and an incision with the scalpel was done. Each port was inserted under direct vision.  The robot was docked from the right side. The robotic instruments were inserted under direct vision with the Endo Shears scissors on the right, the PK on the left and the fenestrated clamp on the third arm.  We went to the console.  Both ureters were seen and  were normal anatomy position. Both ovaries were normal in size and appearance. Both tubes were normal in size and appearance. The uterus was presenting an increased volume with fibroids.  No pathology was present in the abdomen and the appendix was normal to inspection.  We started on the right side we cauterized and sectioned the right round ligament we then cauterized and sectioned the right mesosalpinx.  We cauterized and sectioned the right utero-ovarian ligament. We then opened the visceral peritoneum anteriorly and started lowering the bladder.  We proceeded exactly the same way on the left side. We further descended the bladder passed the coring.  We then cauterized the left uterine artery. We cauterized the backflow and sectioned the uterine artery.  We proceeded the same way on the right side.  We then inflated the occluder. We opened with a upper aspect of the vagina above the coring anteriorly, then on the left side and posteriorly. At this point we used the tip of the Endo Shears scissor to section the uterus in the midline to decrease the diameter and make it easier to extract vaginally.  We finished opening the vagina towards the right side. The uterus was completely detached with both tubes and the cervix. It was easily passed vaginally.  It was sent to pathology. We then put back the occluder vaginally. We switched instruments to the cutting needle driver in the right-hand, locked tip and the left hand and the PK in the third arm.  A V. LOC 0, 9 inches was used to  close the vaginal cuff in a running suture.  We started at the right ankle and tied came back on our steps at about two third of the way.  We used a V. LOC 0, 6 inches to finish the closure at the left vaginal angle.  We irrigated and suctioned the abdominopelvic cavities.  Hemostasis was verified and was adequate at all levels.  The 2 needles were removed from the abdomen.  We then removed all robotic instruments. We undocked the robot.  And we went by laparoscopy. The date Trendelenburg was removed. We irrigated and suctioned the abdominopelvic cavities again.   Confirmed hemostasis again. We applied an Interceed and the vaginal cuff.  We then removed all laparoscopy instruments.  We removed the ports under direct vision. We evacuated the CO2.  The infraumbilical incision was closed at the aponeurosis by attaching the pursestring stitch.  We closed all incisions with a subcuticular stitch of Vicryl 4-0. We added Dermabond on all incisions.  The occluder was removed from the vagina. Hemostasis was adequate at that level as well. The patient was brought to recovery room in good and stable status.  ESTIMATED BLOOD LOSS:  50 cc   Intake/Output Summary (Last 24 hours) at 12/02/13 1633 Last data filed at 12/02/13 1600  Gross per 24 hour  Intake   1000 ml  Output    100 ml  Net    900 ml     BLOOD ADMINISTERED:none   LOCAL MEDICATIONS USED:  MARCAINE     SPECIMEN:  Source of Specimen:  Uterus with cervix, tubes, uterine myomas  DISPOSITION OF SPECIMEN:  PATHOLOGY  COUNTS:  YES  PLAN OF CARE: Transfer to PACU  Princess Bruins  MD  12/02/2013 at 4:33 pm

## 2013-12-02 NOTE — Transfer of Care (Signed)
Immediate Anesthesia Transfer of Care Note  Patient: [name]  Procedure(s) Performed: Procedure(s): ROBOTIC ASSISTED TOTAL HYSTERECTOMY (N/A) BILATERAL SALPINGECTOMY (Bilateral)  Patient Location: PACU  Anesthesia Type:General  Level of Consciousness: awake, alert  and oriented  Airway & Oxygen Therapy: Patient Spontanous Breathing and Patient connected to nasal cannula oxygen  Post-op Assessment: Report given to PACU RN and Post -op Vital signs reviewed and stable  Post vital signs: Reviewed and stable  Complications: No apparent anesthesia complications

## 2013-12-02 NOTE — Anesthesia Preprocedure Evaluation (Signed)
Anesthesia Evaluation  Patient identified by MRN, date of birth, ID band Patient awake    Reviewed: Allergy & Precautions, H&P , NPO status , Patient's Chart, lab work & pertinent test results  Airway Mallampati: III TM Distance: >3 FB Neck ROM: Full    Dental no notable dental hx. (+) Teeth Intact   Pulmonary pneumonia -, resolved, former smoker,  breath sounds clear to auscultation  Pulmonary exam normal       Cardiovascular negative cardio ROS  Rhythm:Regular Rate:Normal     Neuro/Psych negative neurological ROS  negative psych ROS   GI/Hepatic Neg liver ROS, GERD-  Controlled,  Endo/Other  Obesity  Renal/GU negative Renal ROS  negative genitourinary   Musculoskeletal negative musculoskeletal ROS (+)   Abdominal (+) + obese,   Peds  Hematology  (+) anemia ,   Anesthesia Other Findings   Reproductive/Obstetrics Menometrorrhagia Fibroids                           Anesthesia Physical Anesthesia Plan  ASA: II  Anesthesia Plan: General   Post-op Pain Management:    Induction: Intravenous  Airway Management Planned: Oral ETT  Additional Equipment:   Intra-op Plan:   Post-operative Plan: Extubation in OR  Informed Consent: I have reviewed the patients History and Physical, chart, labs and discussed the procedure including the risks, benefits and alternatives for the proposed anesthesia with the patient or authorized representative who has indicated his/her understanding and acceptance.   Dental advisory given  Plan Discussed with: CRNA, Anesthesiologist and Surgeon  Anesthesia Plan Comments:         Anesthesia Quick Evaluation

## 2013-12-02 NOTE — Anesthesia Procedure Notes (Signed)
Procedure Name: Intubation Date/Time: 12/02/2013 1:36 PM Performed by: Jonna Munro Pre-anesthesia Checklist: Patient identified, Emergency Drugs available, Suction available, Patient being monitored and Timeout performed Patient Re-evaluated:Patient Re-evaluated prior to inductionOxygen Delivery Method: Circle system utilized Preoxygenation: Pre-oxygenation with 100% oxygen Intubation Type: IV induction Ventilation: Mask ventilation without difficulty Grade View: Grade II Tube type: Oral Tube size: 7.0 mm Number of attempts: 2 Airway Equipment and Method: Stylet Placement Confirmation: ETT inserted through vocal cords under direct vision,  positive ETCO2 and breath sounds checked- equal and bilateral Secured at: 22 cm Tube secured with: Tape Dental Injury: Teeth and Oropharynx as per pre-operative assessment

## 2013-12-02 NOTE — H&P (Signed)
[name] is an 43 y.o. female  G59P2A1  RP:  Robotic TLH/Bilat. 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    Past Medical History  Diagnosis Date  . Achalasia     S/p Heller myotomy 1991 and numerous dilations  . Pleural effusion 2013    with Heller myotomy surgery  . GERD (gastroesophageal reflux disease)     Hx - None since 2013 Heller myotomy surgery    Past Surgical History  Procedure Laterality Date  . Achalasia surgery  1991    transthoracic Heller myotomy in [place]  . Foreign body removal  10/08/2011    Procedure: FOREIGN BODY REMOVAL;  Surgeon: Rebecca Columbia, MD;  Location: [facility] ENDOSCOPY;  Service: Endoscopy;  Laterality: N/A;  botox  needed /ja/magod  . Esophagogastroduodenoscopy  10/08/2011    Procedure: ESOPHAGOGASTRODUODENOSCOPY (EGD);  Surgeon: Rebecca Columbia, MD;  Location: [facility] ENDOSCOPY;  Service: Endoscopy;  Laterality: N/A;  . Esophagogastroduodenoscopy  10/16/2011    Procedure: ESOPHAGOGASTRODUODENOSCOPY (EGD);  Surgeon: Rebecca Nipper, MD;  Location: [facility] ENDOSCOPY;  Service: Endoscopy;  Laterality: N/A;  . Foreign body removal  10/16/2011    Procedure: FOREIGN BODY REMOVAL;  Surgeon: Rebecca Nipper, MD;  Location: [facility] ENDOSCOPY;  Service: Endoscopy;  Laterality: N/A;  . Cesarean section      x2  . Esophagogastroduodenoscopy  11/06/2011    Procedure: ESOPHAGOGASTRODUODENOSCOPY (EGD);  Surgeon: Rebecca Ng, MD;  Location: [facility] ENDOSCOPY;  Service: Endoscopy;  Laterality: N/A;  [name] myotomy  11/07/2011    Procedure: LAPAROSCOPIC HELLER MYOTOMY;  Surgeon: Rebecca Earls, MD;  Location: [facility] ORS;  Service: General;  Laterality:  N/A;  Laparoscopic Redo Heller Myotomy   . Tonsillectomy      Family History  Problem Relation Age of Onset  . Cancer Paternal Grandmother     breast  . Hypertension Mother   . Anesthesia problems Neg Hx   . Hypotension Neg Hx   . Malignant hyperthermia Neg Hx   . Pseudochol deficiency Neg Hx     Social History:  reports that she quit smoking about 23 years ago. Her smoking use included Cigarettes. She has a .125 pack-year smoking history. She has never used smokeless tobacco. She reports that she drinks alcohol. She reports that she does not use illicit drugs.  Allergies: No Known Allergies  Prescriptions prior to admission  Medication Sig Dispense Refill  . Multiple Vitamin (MULTIVITAMIN WITH MINERALS) TABS tablet Take 1 tablet by mouth daily.        ROS  Blood pressure 134/72, pulse 81, temperature 98.8 F (37.1 C), temperature source Oral, resp. rate 18, SpO2 100.00%. Physical Exam  Pelvic US:  Many myomas, overall uterine volume >400 cc.  Results for orders placed during the hospital encounter of 12/02/13 (from the past 24 hour(s))  PREGNANCY, URINE     Status: None   Collection Time    12/02/13 11:30 AM      Result Value Ref Range   Preg Test, Ur NEGATIVE  NEGATIVE    No results found.  Assessment/Plan: Sxic uterine myomas for Robotic TLH/Bilat. Salpingectomies.  Surgery and risks reviewed.  [name],[name] 12/02/2013, 1:12 PM

## 2013-12-02 NOTE — Anesthesia Postprocedure Evaluation (Signed)
Anesthesia Post Note  Patient: [name]  Procedure(s) Performed: Procedure(s) (LRB): ROBOTIC ASSISTED TOTAL HYSTERECTOMY (N/A) BILATERAL SALPINGECTOMY (Bilateral)  Anesthesia type: General  Patient location: PACU  Post pain: Pain level controlled  Post assessment: Post-op Vital signs reviewed  Last Vitals:  Filed Vitals:   12/02/13 1700  BP: 117/61  Pulse: 79  Temp:   Resp: 14    Post vital signs: Reviewed  Level of consciousness: sedated  Complications: No apparent anesthesia complications

## 2013-12-03 ENCOUNTER — Encounter (HOSPITAL_COMMUNITY): Payer: Self-pay | Admitting: Obstetrics & Gynecology

## 2013-12-03 LAB — CBC
HCT: 33.1 % — ABNORMAL LOW (ref 36.0–46.0)
Hemoglobin: 11.1 g/dL — ABNORMAL LOW (ref 12.0–15.0)
MCH: 21.8 pg — ABNORMAL LOW (ref 26.0–34.0)
MCHC: 33.5 g/dL (ref 30.0–36.0)
MCV: 64.9 fL — ABNORMAL LOW (ref 78.0–100.0)
Platelets: 289 10*3/uL (ref 150–400)
RBC: 5.1 MIL/uL (ref 3.87–5.11)
RDW: 16.1 % — ABNORMAL HIGH (ref 11.5–15.5)
WBC: 20.1 10*3/uL — ABNORMAL HIGH (ref 4.0–10.5)

## 2013-12-03 MED ORDER — OXYCODONE-ACETAMINOPHEN 7.5-325 MG PO TABS: 1.0000 | ORAL_TABLET | ORAL | Status: DC | PRN

## 2013-12-03 NOTE — Progress Notes (Signed)
1 Day Post-Op Procedure(s) (LRB): ROBOTIC ASSISTED TOTAL HYSTERECTOMY (N/A) BILATERAL SALPINGECTOMY (Bilateral)  Subjective: Patient reports that pain is well managed.  Tolerating normal diet as tolerated  diet without difficulty. No nausea / vomiting.  Ambulating and voiding.  Objective: BP 129/74  Pulse 77  Temp(Src) 98.1 F (36.7 C) (Oral)  Resp 18  Ht 5\' 5"  (1.651 m)  Wt 100.699 kg (222 lb)  BMI 36.94 kg/m2  SpO2 97% Lungs: clear Heart: normal rate and rhythm Abdomen:soft and appropriately tender Extremities: Homans sign is negative, no sign of DVT Incision: healing well Postop Hb 11.1  Assessment: s/p Procedure(s): ROBOTIC ASSISTED TOTAL HYSTERECTOMY BILATERAL SALPINGECTOMY: progressing well  Plan: Discharge home  LOS: 1 day    [name],[name] 12/03/2013, 1:18 PM

## 2013-12-03 NOTE — Discharge Summary (Signed)
  Physician Discharge Summary  Patient ID: [name] MRN: [id number] DOB/AGE: August 09, 1971 43 y.o.  Admit date: 12/02/2013 Discharge date: 12/03/2013  Admission Diagnoses: Fibroids, Menometrorrhagia  Discharge Diagnoses: Fibroids, Menometrorrhagia        Active Problems:   Postoperative state   Discharged Condition: good  Hospital Course: good  Consults: None  Treatments: surgery: Robotic total laparoscopic hysterectomy with bilateral salpingectomy  Disposition: 01-Home or Self Care     Medication List         multivitamin with minerals Tabs tablet  Take 1 tablet by mouth daily.     oxyCODONE-acetaminophen 7.5-325 MG per tablet  Commonly known as:  PERCOCET  Take 1 tablet by mouth every 4 (four) hours as needed for pain.           Follow-up Information   Follow up with Elihue Ebert,[name], MD In 3 weeks.   Specialty:  Obstetrics and Gynecology   Contact information:   [address] [place] [address] [phone]       Signed: Princess Bruins, MD 12/03/2013, 1:26 PM

## 2013-12-03 NOTE — Progress Notes (Signed)
Patient discharged to home with spouse. Condition stable. Pt ambulated to car with RN present. No equipment ordered for home for discharge.

## 2013-12-03 NOTE — Discharge Instructions (Signed)
Laparoscopically Assisted Vaginal Hysterectomy, Care After °Refer to this sheet in the next few weeks. These instructions provide you with information on caring for yourself after your procedure. Your health care provider may also give you more specific instructions. Your treatment has been planned according to current medical practices, but problems sometimes occur. Call your health care provider if you have any problems or questions after your procedure. °WHAT TO EXPECT AFTER THE PROCEDURE °After your procedure, it is typical to have the following: °· Abdominal pain. You will be given pain medicine to control it. °· Sore throat from the breathing tube that was inserted during surgery. °HOME CARE INSTRUCTIONS °· Only take over-the-counter or prescription medicines for pain, discomfort, or fever as directed by your health care provider. °· Do not take aspirin. It can cause bleeding. °· Do not drive when taking pain medicine. °· Follow your health care provider's advice regarding diet, exercise, lifting, driving, and general activities. °· Resume your usual diet as directed and allowed. °· Get plenty of rest and sleep. °· Do not douche, use tampons, or have sexual intercourse for at least 6 weeks, or until your health care provider gives you permission. °· Change your bandages (dressings) as directed by your health care provider. °· Monitor your temperature and notify your health care provider of a fever. °· Take showers instead of baths for 2 3 weeks. °· Do not drink alcohol until your health care provider gives you permission. °· If you develop constipation, you may take a mild laxative with your health care provider's permission. Bran foods may help with constipation problems. Drinking enough fluids to keep your urine clear or pale yellow may help as well. °· Try to have someone home with you for 1 2 weeks to help around the house. °· Keep all of your follow-up appointments as directed by your health care  provider. °SEEK MEDICAL CARE IF:  °· You have swelling, redness, or increasing pain around your incision sites. °· You have pus coming from your incision. °· You notice a bad smell coming from your incision. °· Your incision breaks open. °· You feel dizzy or lightheaded. °· You have pain or bleeding when you urinate. °· You have persistent diarrhea. °· You have persistent nausea and vomiting. °· You have abnormal vaginal discharge. °· You have a rash. °· You have any type of abnormal reaction or develop an allergy to your medicine. °· You have poor pain control with your prescribed medicine. °SEEK IMMEDIATE MEDICAL CARE IF:  °· You have a fever. °· You have severe abdominal pain. °· You have chest pain. °· You have shortness of breath. °· You faint. °· You have pain, swelling, or redness in your leg. °· You have heavy vaginal bleeding with blood clots. °Document Released: 08/16/2011 Document Revised: 04/29/2013 Document Reviewed: 03/12/2013 °Exit[employer]® Patient Information ©2014 [employer]. ° °

## 2014-06-30 NOTE — Progress Notes (Signed)
HPI: FU dyspnea and chest pain. I initially saw in March of 2013. Patient has a long history of achalasia. She was admitted in February of 2013 for aspiration pneumonia. The patient had thoracentesis of a left effusion that apparently was unrevealing. Echocardiogram in February of 2013 showed an ejection fraction of 50-55% and a small pericardial effusion. Readmitted in February with complaints of chest pain. She apparently had repeat thoracentesis. 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 She ultimately had myotomy for her achalasia. She was seen in emergency room on March 18 and a CT scan was performed following an elevated d-dimer. This showed no pulmonary embolus. There is a small left pleural effusion with compressive atelectasis in the left lower lobe. There was a small stable pericardial effusion. 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 We did add Indocin. A TSH was normal. Last echocardiogram in July 2013 showed normal LV function and no pericardial effusion. I last saw her in June 2013. Since then, She had improved previously. She had no symptoms for 2 years. However in the past 3 days her symptoms have returned. She describes pain under her left breast and in her back that increases with inspiration and lying flat. She has some dyspnea on exertion. There is no orthopnea, PND or pedal edema. No syncope. She denies fevers or chills and has not aspirated from her achalasia.   Current Outpatient Prescriptions  Medication Sig Dispense Refill  . Brindall Berry-Chromium Pico (APPETITE CONTROL PO) Take 1 tablet by mouth daily.      . Multiple Vitamin (MULTIVITAMIN WITH MINERALS) TABS tablet Take 1 tablet by mouth daily.       No current facility-administered medications for this visit.     Past Medical History  Diagnosis Date  . Achalasia     S/p Heller myotomy 1991 and numerous dilations  . Pleural  effusion 2013    with Heller myotomy surgery  . GERD (gastroesophageal reflux disease)     Hx - None since 2013 Heller myotomy surgery    Past Surgical History  Procedure Laterality Date  . Achalasia surgery  1991    transthoracic Heller myotomy in [place]  . Foreign body removal  10/08/2011    Procedure: FOREIGN BODY REMOVAL;  Surgeon: Jeryl Columbia, MD;  Location: [facility] ENDOSCOPY;  Service: Endoscopy;  Laterality: N/A;  botox  needed /ja/magod  . Esophagogastroduodenoscopy  10/08/2011    Procedure: ESOPHAGOGASTRODUODENOSCOPY (EGD);  Surgeon: Jeryl Columbia, MD;  Location: [facility] ENDOSCOPY;  Service: Endoscopy;  Laterality: N/A;  . Esophagogastroduodenoscopy  10/16/2011    Procedure: ESOPHAGOGASTRODUODENOSCOPY (EGD);  Surgeon: Cleotis Nipper, MD;  Location: [facility] ENDOSCOPY;  Service: Endoscopy;  Laterality: N/A;  . Foreign body removal  10/16/2011    Procedure: FOREIGN BODY REMOVAL;  Surgeon: Cleotis Nipper, MD;  Location: [facility] ENDOSCOPY;  Service: Endoscopy;  Laterality: N/A;  . Cesarean section      x2  . Esophagogastroduodenoscopy  11/06/2011    Procedure: ESOPHAGOGASTRODUODENOSCOPY (EGD);  Surgeon: Lear Ng, MD;  Location: [facility] ENDOSCOPY;  Service: Endoscopy;  Laterality: N/A;  Bubba Hales myotomy  11/07/2011    Procedure: LAPAROSCOPIC HELLER MYOTOMY;  Surgeon: Pedro Earls, MD;  Location: [facility] ORS;  Service: General;  Laterality: N/A;  Laparoscopic Redo Heller Myotomy   . Tonsillectomy    . Robotic assisted total hysterectomy N/A 12/02/2013    Procedure: ROBOTIC ASSISTED TOTAL HYSTERECTOMY;  Surgeon: Princess Bruins, MD;  Location: [facility];  Service: Gynecology;  Laterality: N/A;  . Bilateral salpingectomy Bilateral 12/02/2013    Procedure: BILATERAL SALPINGECTOMY;  Surgeon: Princess Bruins, MD;  Location: [place];  Service: Gynecology;  Laterality: Bilateral;    History   Social History  . Marital Status: Married    Spouse Name: N/A    Number of Children: 2  . Years of Education: N/A    Occupational History  .  [employer]   Social History Main Topics  . Smoking status: Former Smoker -- 0.25 packs/day for .5 years    Types: Cigarettes    Quit date: 10/07/1990  . Smokeless tobacco: Never Used     Comment: Smoked for 2 months, nothing heavier  . Alcohol Use: Yes     Comment: Rarely  . Drug Use: No  . Sexual Activity: Yes    Birth Control/ Protection: None   Other Topics Concern  . Not on file   Social History Narrative   Lives at home with husband, working and goes to school, has 2 kids. Very minimal smoking history, very rare alcohol.     ROS: no fevers or chills, productive cough, hemoptysis, dysphasia, odynophagia, melena, hematochezia, dysuria, hematuria, rash, seizure activity, orthopnea, PND, pedal edema, claudication. Remaining systems are negative.  Physical Exam: Well-developed well-nourished in no acute distress.  Skin is warm and dry.  HEENT is normal.  Neck is supple.  Chest is clear to auscultation with normal expansion.  Cardiovascular exam is regular rate and rhythm.  Abdominal exam nontender or distended. No masses palpated. Extremities show no edema. neuro grossly intact  ECG Normal sinus rhythm with no ST changes. Right axis deviation.

## 2014-07-01 ENCOUNTER — Ambulatory Visit (INDEPENDENT_AMBULATORY_CARE_PROVIDER_SITE_OTHER): Payer: BC Managed Care – PPO | Admitting: Cardiology

## 2014-07-01 ENCOUNTER — Ambulatory Visit (INDEPENDENT_AMBULATORY_CARE_PROVIDER_SITE_OTHER)
Admission: RE | Admit: 2014-07-01 | Discharge: 2014-07-01 | Disposition: A | Discharge status: Home / Self Care | Payer: BC Managed Care – PPO | Source: Ambulatory Visit | Attending: Cardiology | Admitting: Cardiology

## 2014-07-01 ENCOUNTER — Encounter: Payer: Self-pay | Admitting: *Deleted

## 2014-07-01 ENCOUNTER — Encounter: Payer: Self-pay | Admitting: Cardiology

## 2014-07-01 VITALS — BP 128/70 | HR 86 | Ht 66.0 in | Wt 217.0 lb

## 2014-07-01 DIAGNOSIS — I313 Pericardial effusion (noninflammatory): Secondary | ICD-10-CM

## 2014-07-01 DIAGNOSIS — I3139 Other pericardial effusion (noninflammatory): Secondary | ICD-10-CM

## 2014-07-01 DIAGNOSIS — R0600 Dyspnea, unspecified: Secondary | ICD-10-CM

## 2014-07-01 DIAGNOSIS — I319 Disease of pericardium, unspecified: Secondary | ICD-10-CM

## 2014-07-01 LAB — BASIC METABOLIC PANEL WITH GFR
BUN: 11 mg/dL (ref 6–23)
CO2: 24 mEq/L (ref 19–32)
Calcium: 9.8 mg/dL (ref 8.4–10.5)
Chloride: 103 mEq/L (ref 96–112)
Creat: 0.78 mg/dL (ref 0.50–1.10)
GFR, Est African American: >89 mL/min
GFR, Est Non African American: >89 mL/min
Glucose, Bld: 91 mg/dL (ref 70–99)
Potassium: 4.3 mEq/L (ref 3.5–5.3)
Sodium: 138 mEq/L (ref 135–145)

## 2014-07-01 LAB — CBC
HCT: 34.6 % — ABNORMAL LOW (ref 36.0–46.0)
Hemoglobin: 11.4 g/dL — ABNORMAL LOW (ref 12.0–15.0)
MCH: 20.8 pg — ABNORMAL LOW (ref 26.0–34.0)
MCHC: 32.9 g/dL (ref 30.0–36.0)
MCV: 63 fL — ABNORMAL LOW (ref 78.0–100.0)
Platelets: 365 10*3/uL (ref 150–400)
RBC: 5.49 MIL/uL — ABNORMAL HIGH (ref 3.87–5.11)
RDW: 16.1 % — ABNORMAL HIGH (ref 11.5–15.5)
WBC: 11.5 10*3/uL — ABNORMAL HIGH (ref 4.0–10.5)

## 2014-07-01 MED ORDER — INDOMETHACIN 25 MG PO CAPS: ORAL_CAPSULE | ORAL | Status: DC

## 2014-07-01 NOTE — Assessment & Plan Note (Signed)
Plan followup echocardiogram to rule out effusion given possible pericarditis.

## 2014-07-01 NOTE — Patient Instructions (Signed)
Your physician recommends that you schedule a follow-up appointment in: 4-6 [place]  A chest x-ray takes a picture of the organs and structures inside the chest, including the heart, lungs, and blood vessels. This test can show several things, including, whether the heart is enlarges; whether fluid is building up in the lungs; and whether pacemaker / defibrillator leads are still in place. AT [address] TO THE BASEMENT  Your physician has requested that you have an echocardiogram. Echocardiography is a painless test that uses sound waves to create images of your heart. It provides your doctor with information about the size and shape of your heart and how well your heart's chambers and valves are working. This procedure takes approximately one hour. There are no restrictions for this procedure.   Your physician recommends that you HAVE LAB WORK TODAY  START INDOCIN 25 MG ONE TABLET THREE TIMES DAILY X 10 DAYS

## 2014-07-01 NOTE — Assessment & Plan Note (Addendum)
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 She has chest pain that increases with inspiration and lying flat. I will check a chest x-ray, echocardiogram to exclude effusion, CBC, renal function and BNP. I have given a prescription for Indocin 25 mg by mouth 3 times a day for 10 days. I will see her back in 4-6 weeks to make sure she is improving. Note she does not have risk factors for pulmonary embolus or DVT. Can consider colchicine in the future if symptoms recur.

## 2014-07-02 ENCOUNTER — Telehealth: Payer: Self-pay | Admitting: Cardiology

## 2014-07-02 LAB — BRAIN NATRIURETIC PEPTIDE: Brain Natriuretic Peptide: 28.6 pg/mL (ref 0.0–100.0)

## 2014-07-02 NOTE — Telephone Encounter (Signed)
Pt called in stating that she had some x-rays and labs done a couple of days ago and she would like to know what the results are. Please call  Thanks

## 2014-07-02 NOTE — Telephone Encounter (Signed)
Pt. wants results from labs and xray

## 2014-07-02 NOTE — Telephone Encounter (Signed)
I called pt. With cxr results but labs were not in yet

## 2014-07-05 ENCOUNTER — Ambulatory Visit (HOSPITAL_COMMUNITY)
Admission: RE | Admit: 2014-07-05 | Discharge: 2014-07-05 | Disposition: A | Discharge status: Home / Self Care | Payer: BC Managed Care – PPO | Source: Ambulatory Visit | Attending: Cardiovascular Disease | Admitting: Cardiovascular Disease

## 2014-07-05 DIAGNOSIS — I313 Pericardial effusion (noninflammatory): Secondary | ICD-10-CM

## 2014-07-05 DIAGNOSIS — I059 Rheumatic mitral valve disease, unspecified: Secondary | ICD-10-CM

## 2014-07-05 DIAGNOSIS — I3139 Other pericardial effusion (noninflammatory): Secondary | ICD-10-CM

## 2014-07-05 DIAGNOSIS — R0600 Dyspnea, unspecified: Secondary | ICD-10-CM | POA: Diagnosis not present

## 2014-07-05 NOTE — Progress Notes (Signed)
2D Echo Performed 07/05/2014    Rasheem Figiel, RCS  

## 2014-07-12 ENCOUNTER — Ambulatory Visit (HOSPITAL_COMMUNITY): Payer: BC Managed Care – PPO

## 2014-08-18 NOTE — Progress Notes (Signed)
HPI: FU dyspnea and chest pain. H/O achalasia. 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 Echocardiogram in July 2013 showed normal LV function and no pericardial effusion. I saw her recently in October 2015 with recurrent chest pain and dyspnea. Echocardiogram October 2015 showed normal LV function, mild left ventricular hypertrophy, mild left atrial enlargement and mild mitral regurgitation. Chest x-ray October 2015 showed cardiac enlargement. Indocin was added to her medical regimen. Since I last saw her,   Current Outpatient Prescriptions  Medication Sig Dispense Refill  . Brindall Berry-Chromium Pico (APPETITE CONTROL PO) Take 1 tablet by mouth daily.    . indomethacin (INDOCIN) 25 MG capsule TAKE ONE TABLET THREE TIMES DAILY X 10 DAYS 30 capsule 0  . Multiple Vitamin (MULTIVITAMIN WITH MINERALS) TABS tablet Take 1 tablet by mouth daily.     No current facility-administered medications for this visit.     Past Medical History  Diagnosis Date  . Achalasia     S/p Heller myotomy 1991 and numerous dilations  . Pleural effusion 2013    with Heller myotomy surgery  . GERD (gastroesophageal reflux disease)     Hx - None since 2013 Heller myotomy surgery    Past Surgical History  Procedure Laterality Date  . Achalasia surgery  1991    transthoracic Heller myotomy in [place]  . Foreign body removal  10/08/2011    Procedure: FOREIGN BODY REMOVAL;  Surgeon: Jeryl Columbia, MD;  Location: [facility] ENDOSCOPY;  Service: Endoscopy;  Laterality: N/A;  botox  needed /ja/magod  . Esophagogastroduodenoscopy  10/08/2011    Procedure: ESOPHAGOGASTRODUODENOSCOPY (EGD);  Surgeon: Jeryl Columbia, MD;  Location: [facility] ENDOSCOPY;  Service: Endoscopy;  Laterality: N/A;  . Esophagogastroduodenoscopy  10/16/2011    Procedure: ESOPHAGOGASTRODUODENOSCOPY (EGD);  Surgeon: Cleotis Nipper, MD;  Location: [facility] ENDOS[facility];  Service: Endoscopy;  Laterality: N/A;  . Foreign body removal   10/16/2011    Procedure: FOREIGN BODY REMOVAL;  Surgeon: Cleotis Nipper, MD;  Location: [facility] ENDOSCOPY;  Service: Endoscopy;  Laterality: N/A;  . Cesarean section      x2  . Esophagogastroduodenoscopy  11/06/2011    Procedure: ESOPHAGOGASTRODUODENOSCOPY (EGD);  Surgeon: Lear Ng, MD;  Location: [facility] ENDOSCOPY;  Service: Endoscopy;  Laterality: N/A;  Bubba Hales myotomy  11/07/2011    Procedure: LAPAROSCOPIC HELLER MYOTOMY;  Surgeon: Pedro Earls, MD;  Location: [facility] ORS;  Service: General;  Laterality: N/A;  Laparoscopic Redo Heller Myotomy   . Tonsillectomy    . Robotic assisted total hysterectomy N/A 12/02/2013    Procedure: ROBOTIC ASSISTED TOTAL HYSTERECTOMY;  Surgeon: Princess Bruins, MD;  Location: [facility];  Service: Gynecology;  Laterality: N/A;  . Bilateral salpingectomy Bilateral 12/02/2013    Procedure: BILATERAL SALPINGECTOMY;  Surgeon: Princess Bruins, MD;  Location: [place] [facility];  Service: Gynecology;  Laterality: Bilateral;    History   Social History  . Marital Status: Married    Spouse Name: N/A    Number of Children: 2  . Years of Education: N/A   Occupational History  .  [employer]   Social History Main Topics  . Smoking status: Former Smoker -- 0.25 packs/day for .5 years    Types: Cigarettes    Quit date: 10/07/1990  . Smokeless tobacco: Never Used     Comment: Smoked for 2 months, nothing heavier  . Alcohol Use: Yes     Comment: Rarely  . Drug Use: No  . Sexual  Activity: Yes    Birth Control/ Protection: None   Other Topics Concern  . Not on file   Social History Narrative   Lives at home with husband, working and goes to school, has 2 kids. Very minimal smoking history, very rare alcohol.     ROS: no fevers or chills, productive cough, hemoptysis, dysphasia, odynophagia, melena, hematochezia, dysuria, hematuria, rash, seizure activity, orthopnea, PND, pedal edema, claudication. Remaining systems are negative.  Physical  Exam: Well-developed well-nourished in no acute distress.  Skin is warm and dry.  HEENT is normal.  Neck is supple.  Chest is clear to auscultation with normal expansion.  Cardiovascular exam is regular rate and rhythm.  Abdominal exam nontender or distended. No masses palpated. Extremities show no edema. neuro grossly intact  ECG     This encounter was created in error - please disregard.

## 2014-08-20 ENCOUNTER — Ambulatory Visit: Payer: BC Managed Care – PPO | Admitting: Internal Medicine

## 2014-08-23 ENCOUNTER — Encounter: Payer: BC Managed Care – PPO | Admitting: Cardiology

## 2014-08-25 ENCOUNTER — Encounter: Payer: Self-pay | Admitting: Cardiology

## 2014-12-01 ENCOUNTER — Ambulatory Visit: Payer: Self-pay | Admitting: Family Medicine

## 2015-11-25 ENCOUNTER — Other Ambulatory Visit: Payer: Self-pay | Admitting: Urology

## 2015-11-25 DIAGNOSIS — N281 Cyst of kidney, acquired: Secondary | ICD-10-CM

## 2015-11-25 DIAGNOSIS — R10A0 Flank pain, unspecified side: Secondary | ICD-10-CM

## 2015-11-25 DIAGNOSIS — R109 Unspecified abdominal pain: Secondary | ICD-10-CM

## 2015-12-02 ENCOUNTER — Ambulatory Visit
Admission: RE | Admit: 2015-12-02 | Discharge: 2015-12-02 | Disposition: A | Discharge status: Home / Self Care | Payer: BLUE CROSS/BLUE SHIELD | Source: Ambulatory Visit | Attending: Urology | Admitting: Urology

## 2015-12-02 DIAGNOSIS — N281 Cyst of kidney, acquired: Secondary | ICD-10-CM

## 2015-12-02 DIAGNOSIS — R109 Unspecified abdominal pain: Secondary | ICD-10-CM

## 2015-12-02 MED ORDER — IOPAMIDOL (ISOVUE-300) INJECTION 61%
100.0000 mL | Freq: Once | INTRAVENOUS | Status: AC | PRN
  Administered 2015-12-02: 100 mL via INTRAVENOUS

## 2016-05-07 ENCOUNTER — Emergency Department (HOSPITAL_COMMUNITY): Payer: Self-pay

## 2016-05-07 ENCOUNTER — Encounter (HOSPITAL_COMMUNITY): Payer: Self-pay

## 2016-05-07 ENCOUNTER — Emergency Department (HOSPITAL_COMMUNITY)
Admission: EM | Admit: 2016-05-07 | Discharge: 2016-05-08 | Disposition: A | Discharge status: Home / Self Care | Payer: Self-pay | Attending: Emergency Medicine | Admitting: Emergency Medicine

## 2016-05-07 DIAGNOSIS — H34211 Partial retinal artery occlusion, right eye: Secondary | ICD-10-CM

## 2016-05-07 DIAGNOSIS — H53132 Sudden visual loss, left eye: Secondary | ICD-10-CM

## 2016-05-07 DIAGNOSIS — Z87891 Personal history of nicotine dependence: Secondary | ICD-10-CM | POA: Insufficient documentation

## 2016-05-07 DIAGNOSIS — G43809 Other migraine, not intractable, without status migrainosus: Secondary | ICD-10-CM

## 2016-05-07 DIAGNOSIS — Z79899 Other long term (current) drug therapy: Secondary | ICD-10-CM | POA: Insufficient documentation

## 2016-05-07 DIAGNOSIS — Z5181 Encounter for therapeutic drug level monitoring: Secondary | ICD-10-CM | POA: Insufficient documentation

## 2016-05-07 DIAGNOSIS — H34212 Partial retinal artery occlusion, left eye: Secondary | ICD-10-CM

## 2016-05-07 LAB — COMPREHENSIVE METABOLIC PANEL
ALT: 20 U/L (ref 14–54)
AST: 23 U/L (ref 15–41)
Albumin: 4.5 g/dL (ref 3.5–5.0)
Alkaline Phosphatase: 48 U/L (ref 38–126)
Anion gap: 10 (ref 5–15)
BUN: 14 mg/dL (ref 6–20)
CO2: 24 mmol/L (ref 22–32)
Calcium: 9.8 mg/dL (ref 8.9–10.3)
Chloride: 103 mmol/L (ref 101–111)
Creatinine, Ser: 0.8 mg/dL (ref 0.44–1.00)
GFR calc Af Amer: >60 mL/min (ref >60)
GFR calc non Af Amer: >60 mL/min (ref >60)
Glucose, Bld: 110 mg/dL — ABNORMAL HIGH (ref 65–99)
Potassium: 3.7 mmol/L (ref 3.5–5.1)
Sodium: 137 mmol/L (ref 135–145)
Total Bilirubin: 0.8 mg/dL (ref 0.3–1.2)
Total Protein: 7.6 g/dL (ref 6.5–8.1)

## 2016-05-07 LAB — PROTIME-INR
INR: 1.07
Prothrombin Time: 13.9 seconds (ref 11.4–15.2)

## 2016-05-07 LAB — DIFFERENTIAL
Basophils Absolute: 0 10*3/uL (ref 0.0–0.1)
Basophils Relative: 0 %
Eosinophils Absolute: 0.1 10*3/uL (ref 0.0–0.7)
Eosinophils Relative: 1 %
Lymphocytes Relative: 22 %
Lymphs Abs: 2.2 10*3/uL (ref 0.7–4.0)
Monocytes Absolute: 0.5 10*3/uL (ref 0.1–1.0)
Monocytes Relative: 5 %
Neutro Abs: 7.3 10*3/uL (ref 1.7–7.7)
Neutrophils Relative %: 72 %

## 2016-05-07 LAB — CBC
HCT: 35 % — ABNORMAL LOW (ref 36.0–46.0)
Hemoglobin: 11.3 g/dL — ABNORMAL LOW (ref 12.0–15.0)
MCH: 21.1 pg — ABNORMAL LOW (ref 26.0–34.0)
MCHC: 32.3 g/dL (ref 30.0–36.0)
MCV: 65.4 fL — ABNORMAL LOW (ref 78.0–100.0)
Platelets: 297 10*3/uL (ref 150–400)
RBC: 5.35 MIL/uL — ABNORMAL HIGH (ref 3.87–5.11)
RDW: 15.5 % (ref 11.5–15.5)
WBC: 10.1 10*3/uL (ref 4.0–10.5)

## 2016-05-07 LAB — I-STAT CHEM 8, ED
BUN: 16 mg/dL (ref 6–20)
Calcium, Ion: 1.17 mmol/L (ref 1.13–1.30)
Chloride: 104 mmol/L (ref 101–111)
Creatinine, Ser: 0.8 mg/dL (ref 0.44–1.00)
Glucose, Bld: 110 mg/dL — ABNORMAL HIGH (ref 65–99)
HCT: 38 % (ref 36.0–46.0)
Hemoglobin: 12.9 g/dL (ref 12.0–15.0)
Potassium: 3.7 mmol/L (ref 3.5–5.1)
Sodium: 140 mmol/L (ref 135–145)
TCO2: 25 mmol/L (ref 0–100)

## 2016-05-07 LAB — LIPID PANEL
Cholesterol: 140 mg/dL (ref 0–200)
HDL: 33 mg/dL — ABNORMAL LOW (ref >40)
LDL Cholesterol: 81 mg/dL (ref 0–99)
Total CHOL/HDL Ratio: 4.2 RATIO
Triglycerides: 129 mg/dL (ref <150)
VLDL: 26 mg/dL (ref 0–40)

## 2016-05-07 LAB — I-STAT TROPONIN, ED: Troponin i, poc: 0 ng/mL (ref 0.00–0.08)

## 2016-05-07 LAB — APTT: aPTT: 39 seconds — ABNORMAL HIGH (ref 24–36)

## 2016-05-07 MED ORDER — METOCLOPRAMIDE HCL 5 MG/ML IJ SOLN
10.0000 mg | INTRAMUSCULAR | Status: AC
  Administered 2016-05-07: 10 mg via INTRAVENOUS
  Filled 2016-05-07: qty 2

## 2016-05-07 MED ORDER — GADOBENATE DIMEGLUMINE 529 MG/ML IV SOLN
18.0000 mL | Freq: Once | INTRAVENOUS | Status: AC | PRN
  Administered 2016-05-07: 18 mL via INTRAVENOUS

## 2016-05-07 MED ORDER — DEXAMETHASONE SODIUM PHOSPHATE 10 MG/ML IJ SOLN
10.0000 mg | Freq: Once | INTRAMUSCULAR | Status: AC
  Administered 2016-05-07: 10 mg via INTRAVENOUS
  Filled 2016-05-07: qty 1

## 2016-05-07 MED ORDER — BUTALBITAL-APAP-CAFFEINE 50-325-40 MG PO TABS: 1.0000 | ORAL_TABLET | Freq: Three times a day (TID) | ORAL | 0 refills | Status: AC | PRN

## 2016-05-07 NOTE — ED Provider Notes (Signed)
[facility] DEPT Provider Note   CSN: [id number] Arrival date & time: 05/07/16  1550    History   Chief Complaint Chief Complaint  Patient presents with  . Eye Pain  . Migraine    HPI [name] is a 45 y.o. female.  45 year old female with a history of esophageal reflux and cardiomyopathy (resolved x 2 years; not followed by cards) presents to the emergency department at the request of an ophthalmologist. She reports that she has had intermittent intermittent, waxing and waning, sharp headache for the past 5 days. She initially thought that her headache was due to sinus congestion. She took some Benadryl which allowed her to sleep. This would temporarily improve her headache at times. She noticed some worsening of her headache this morning at 11 AM. Patient also began to have vision changes yesterday. She describes episodes of vision loss in her left eye. She states that these last for approximately 10-15 minutes before spontaneously resolving. Vision loss and resolution is gradual. Patient denies any modifying factors of her symptoms. She does not note worsening in her vision with a worsening headache. She has had no recent head trauma. No fevers. Patient denies nausea, vomiting, dizziness, gait instability, or unilateral numbness or weakness. 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   The history is provided by the patient. No language interpreter was used.  Eye Pain  Associated symptoms include headaches. Pertinent negatives include no chest pain.  Migraine  Associated symptoms include headaches. Pertinent negatives include no chest pain.    Past Medical History:  Diagnosis Date  . Achalasia    S/p Heller myotomy 1991 and numerous dilations  . GERD (gastroesophageal reflux disease)    Hx - None since 2013 Heller myotomy surgery  . Pleural effusion 2013   with  Heller myotomy surgery    Patient Active Problem List   Diagnosis Date Noted  . Postoperative state 12/02/2013  . Chest pain 11/29/2011  . D-dimer, elevated 10/30/2011  . Aspiration pneumonia ([place]) 10/28/2011  . Achalasia 10/18/2011  . Leukocytosis 10/18/2011  . Thrombocytosis ([place]) 10/18/2011  . Pericardial effusion 10/18/2011  . Pleural effusion, left 10/18/2011  . Microcytic anemia 10/18/2011  . Tachycardia 10/18/2011  . SIRS (systemic inflammatory response syndrome) ([place]) 10/18/2011    Past Surgical History:  Procedure Laterality Date  . ACHALASIA SURGERY  1991   transthoracic Heller myotomy in [place]  . BILATERAL SALPINGECTOMY Bilateral 12/02/2013   Procedure: BILATERAL SALPINGECTOMY;  Surgeon: Princess Bruins, MD;  Location: [facility];  Service: Gynecology;  Laterality: Bilateral;  . CESAREAN SECTION     x2  . ESOPHAGOGASTRODUODENOSCOPY  10/08/2011   Procedure: ESOPHAGOGASTRODUODENOSCOPY (EGD);  Surgeon: Jeryl Columbia, MD;  Location: [facility];  Service: Endoscopy;  Laterality: N/A;  . ESOPHAGOGASTRODUODENOSCOPY  10/16/2011   Procedure: ESOPHAGOGASTRODUODENOSCOPY (EGD);  Surgeon: Cleotis Nipper, MD;  Location: [facility];  Service: Endoscopy;  Laterality: N/A;  . ESOPHAGOGASTRODUODENOSCOPY  11/06/2011   Procedure: ESOPHAGOGASTRODUODENOSCOPY (EGD);  Surgeon: Lear Ng, MD;  Location: [facility];  Service: Endoscopy;  Laterality: N/A;  . FOREIGN BODY REMOVAL  10/08/2011   Procedure: FOREIGN BODY REMOVAL;  Surgeon: Jeryl Columbia, MD;  Location: [facility];  Service: Endoscopy;  Laterality: N/A;  botox  needed /ja/magod  . FOREIGN BODY REMOVAL  10/16/2011   Procedure: FOREIGN BODY REMOVAL;  Surgeon: Cleotis Nipper, MD;  Location: [facility];  Service: Endoscopy;  Laterality: N/A;  . HELLER MYOTOMY  11/07/2011   Procedure: LAPAROSCOPIC HELLER MYOTOMY;  Surgeon: Pedro Earls, MD;  Location: [facility];  Service: General;  Laterality: N/A;  Laparoscopic Redo Heller  Myotomy   . ROBOTIC ASSISTED TOTAL HYSTERECTOMY N/A 12/02/2013   Procedure: ROBOTIC ASSISTED TOTAL HYSTERECTOMY;  Surgeon: Princess Bruins, MD;  Location: [facility];  Service: Gynecology;  Laterality: N/A;  . TONSILLECTOMY      OB History    No data available       Home Medications    Prior to Admission medications   Medication Sig Start Date End Date Taking? Authorizing Provider  ibuprofen (ADVIL,MOTRIN) 800 MG tablet Take 800 mg by mouth every 8 (eight) hours as needed for fever.   Yes Historical Provider, MD  indomethacin (INDOCIN) 25 MG capsule TAKE ONE TABLET THREE TIMES DAILY X 10 DAYS Patient not taking: Reported on 05/07/2016 07/01/14   Lelon Perla, MD    Family History Family History  Problem Relation Age of Onset  . Hypertension Mother   . Cancer Paternal Grandmother     breast  . Anesthesia problems Neg Hx   . Hypotension Neg Hx   . Malignant hyperthermia Neg Hx   . Pseudochol deficiency Neg Hx     Social History Social History  Substance Use Topics  . Smoking status: Former Smoker    Packs/day: 0.25    Years: 0.50    Types: Cigarettes    Quit date: 10/07/1990  . Smokeless tobacco: Never Used     Comment: Smoked for 2 months, nothing heavier  . Alcohol use Yes     Comment: Rarely     Allergies   Review of patient's allergies indicates no known allergies.   Review of Systems Review of Systems  Constitutional: Negative for fever.  Eyes: Positive for photophobia (mild) and pain.  Cardiovascular: Negative for chest pain.  Gastrointestinal: Negative for nausea and vomiting.  Musculoskeletal: Negative for neck stiffness.  Neurological: Positive for headaches. Negative for weakness and numbness.  Ten systems reviewed and are negative for acute change, except as noted in the HPI.     Physical Exam Updated Vital Signs BP 141/82   Pulse 71   Temp 98.5 F (36.9 C) (Oral)   Resp 25   Ht 5\' 6"  (1.676 m)   Wt 83.9 kg   LMP 11/17/2013   SpO2 98%    BMI 29.86 kg/m   Physical Exam  Constitutional: She is oriented to person, place, and time. She appears well-developed and well-nourished. No distress.  HENT:  Head: Normocephalic and atraumatic.  Mouth/Throat: Oropharynx is clear and moist.  Symmetric rise of the uvula with phonation  Eyes: Conjunctivae and EOM are normal. Pupils are equal, round, and reactive to light. No scleral icterus.  Neck: Normal range of motion.  No nuchal rigidity or meningismus  Cardiovascular: Normal rate, regular rhythm and intact distal pulses.   Pulmonary/Chest: Effort normal. No respiratory distress. She has no wheezes.  Respirations even and unlabored  Musculoskeletal: Normal range of motion.  Neurological: She is alert and oriented to person, place, and time. She displays normal reflexes. No cranial nerve deficit. Coordination normal.  GCS 15. Speech is goal oriented. No cranial nerve deficits appreciated; symmetric eyebrow raise, no facial drooping, tongue midline. Patient has equal grip strength bilaterally with 5/5 strength against resistance in all major muscle groups bilaterally. Sensation to light touch intact. Patient moves extremities without ataxia.  Skin: Skin is warm  and dry. No rash noted. She is not diaphoretic. No erythema. No pallor.  Psychiatric: She has a normal mood and affect. Her behavior is normal.  Nursing note and vitals reviewed.    ED Treatments / Results  Labs (all labs ordered are listed, but only abnormal results are displayed) Labs Reviewed  APTT - Abnormal; Notable for the following:       Result Value   aPTT 39 (*)    All other components within normal limits  CBC - Abnormal; Notable for the following:    RBC 5.35 (*)    Hemoglobin 11.3 (*)    HCT 35.0 (*)    MCV 65.4 (*)    MCH 21.1 (*)    All other components within normal limits  COMPREHENSIVE METABOLIC PANEL - Abnormal; Notable for the following:    Glucose, Bld 110 (*)    All other components within  normal limits  LIPID PANEL - Abnormal; Notable for the following:    HDL 33 (*)    All other components within normal limits  I-STAT CHEM 8, ED - Abnormal; Notable for the following:    Glucose, Bld 110 (*)    All other components within normal limits  PROTIME-INR  DIFFERENTIAL  I-STAT TROPOININ, ED  CBG MONITORING, ED    EKG  EKG Interpretation None       Radiology Ct Head Wo Contrast  Result Date: 05/07/2016 CLINICAL DATA:  Headaches. Intermittent loss of vision in left eye. Frontal headaches. EXAM: CT HEAD WITHOUT CONTRAST TECHNIQUE: Contiguous axial images were obtained from the base of the skull through the vertex without intravenous contrast. COMPARISON:  None. FINDINGS: Brain: No acute infarct, hemorrhage, or mass lesion is present. The ventricles are of normal size. No significant extraaxial fluid collection is present. Vascular: No significant atherosclerotic calcifications are present. There are no hyperdense vessels. The dural sinuses are within normal limits. Skull: Within normal limits Sinuses/Orbits: The visualized paranasal sinuses and mastoid air cells are clear. The visualized globes and orbits are within normal limits. Other: No significant extracranial soft tissue lesion is present. IMPRESSION: Negative CT of the head. Electronically Signed   By: San Morelle M.D.   On: 05/07/2016 16:46   Mr Jodene Nam Head Wo Contrast  Result Date: 05/07/2016 CLINICAL DATA:  Loss of vision left eye.  Frontal headache. EXAM: MR HEAD WITHOUT AND WITH CONTRAST MR CIRCLE OF WILLIS WITHOUT CONTRAST MRA OF THE NECK WITHOUT AND WITH CONTRAST TECHNIQUE: Multiplanar, multiecho pulse sequences of the brain and surrounding structures were obtained according to standard protocol without and with intravenous contrast; Multiplanar and multiecho pulse sequences of the neck were obtained without and with intravenous contrast. Angiographic images of the neck were obtained using MRA technique without  and with intravenous contast.; Angiographic images of the Circle of Willis were obtained using MRA technique without intravenous contrast. CONTRAST:  61mL MULTIHANCE GADOBENATE DIMEGLUMINE 529 MG/ML IV SOLN COMPARISON:  Head CT performed earlier the same day. FINDINGS: MR HEAD FINDINGS Brain Parenchyma: No acute infarct or intraparenchymal hemorrhage. No focal parenchymal signal abnormality. No mass lesion or midline shift. The major intracranial flow voids are preserved. The midline structures are normal. Ventricles, Sulci and Extra-axial Spaces: Normal for age. No extra-axial collection. Paranasal Sinuses and Mastoids: No fluid levels or advanced mucosal thickening. Orbits: Normal. Bones and Soft Tissues: The visualized skull base, calvarium and extracranial soft tissues are normal. MR CIRCLE OF WILLIS FINDINGS Intracranial internal carotid arteries: Normal. Anterior cerebral arteries: Normal. Middle cerebral arteries:  Normal. Posterior communicating arteries: Present bilaterally. Posterior cerebral arteries: Normal. Basilar artery: Normal. Vertebral arteries: Left dominant. Normal. Superior cerebellar arteries: Normal. Anterior inferior cerebellar arteries: Normal. Posterior inferior cerebellar arteries: Normal. MRA NECK FINDINGS The carotid and vertebral arteries are normal. Limited visualization of the neck soft tissues is unremarkable. There is a normal 3 vessel aortic arch and normal bilateral proximal subclavian arteries. IMPRESSION: 1. Normal MRI of the brain. 2. Normal MRA of the head and neck. Electronically Signed   By: Ulyses Jarred M.D.   On: 05/07/2016 22:38   Mr Angiogram Neck W Or Wo Contrast  Result Date: 05/07/2016 CLINICAL DATA:  Loss of vision left eye.  Frontal headache. EXAM: MR HEAD WITHOUT AND WITH CONTRAST MR CIRCLE OF WILLIS WITHOUT CONTRAST MRA OF THE NECK WITHOUT AND WITH CONTRAST TECHNIQUE: Multiplanar, multiecho pulse sequences of the brain and surrounding structures were obtained  according to standard protocol without and with intravenous contrast; Multiplanar and multiecho pulse sequences of the neck were obtained without and with intravenous contrast. Angiographic images of the neck were obtained using MRA technique without and with intravenous contast.; Angiographic images of the Circle of Willis were obtained using MRA technique without intravenous contrast. CONTRAST:  57mL MULTIHANCE GADOBENATE DIMEGLUMINE 529 MG/ML IV SOLN COMPARISON:  Head CT performed earlier the same day. FINDINGS: MR HEAD FINDINGS Brain Parenchyma: No acute infarct or intraparenchymal hemorrhage. No focal parenchymal signal abnormality. No mass lesion or midline shift. The major intracranial flow voids are preserved. The midline structures are normal. Ventricles, Sulci and Extra-axial Spaces: Normal for age. No extra-axial collection. Paranasal Sinuses and Mastoids: No fluid levels or advanced mucosal thickening. Orbits: Normal. Bones and Soft Tissues: The visualized skull base, calvarium and extracranial soft tissues are normal. MR CIRCLE OF WILLIS FINDINGS Intracranial internal carotid arteries: Normal. Anterior cerebral arteries: Normal. Middle cerebral arteries: Normal. Posterior communicating arteries: Present bilaterally. Posterior cerebral arteries: Normal. Basilar artery: Normal. Vertebral arteries: Left dominant. Normal. Superior cerebellar arteries: Normal. Anterior inferior cerebellar arteries: Normal. Posterior inferior cerebellar arteries: Normal. MRA NECK FINDINGS The carotid and vertebral arteries are normal. Limited visualization of the neck soft tissues is unremarkable. There is a normal 3 vessel aortic arch and normal bilateral proximal subclavian arteries. IMPRESSION: 1. Normal MRI of the brain. 2. Normal MRA of the head and neck. Electronically Signed   By: Ulyses Jarred M.D.   On: 05/07/2016 22:38   Mr Jeri Cos [id number] Contrast  Result Date: 05/07/2016 CLINICAL DATA:  Loss of vision left eye.   Frontal headache. EXAM: MR HEAD WITHOUT AND WITH CONTRAST MR CIRCLE OF WILLIS WITHOUT CONTRAST MRA OF THE NECK WITHOUT AND WITH CONTRAST TECHNIQUE: Multiplanar, multiecho pulse sequences of the brain and surrounding structures were obtained according to standard protocol without and with intravenous contrast; Multiplanar and multiecho pulse sequences of the neck were obtained without and with intravenous contrast. Angiographic images of the neck were obtained using MRA technique without and with intravenous contast.; Angiographic images of the Circle of Willis were obtained using MRA technique without intravenous contrast. CONTRAST:  58mL MULTIHANCE GADOBENATE DIMEGLUMINE 529 MG/ML IV SOLN COMPARISON:  Head CT performed earlier the same day. FINDINGS: MR HEAD FINDINGS Brain Parenchyma: No acute infarct or intraparenchymal hemorrhage. No focal parenchymal signal abnormality. No mass lesion or midline shift. The major intracranial flow voids are preserved. The midline structures are normal. Ventricles, Sulci and Extra-axial Spaces: Normal for age. No extra-axial collection. Paranasal Sinuses and Mastoids: No fluid levels or advanced mucosal thickening. Orbits: Normal.  Bones and Soft Tissues: The visualized skull base, calvarium and extracranial soft tissues are normal. MR CIRCLE OF WILLIS FINDINGS Intracranial internal carotid arteries: Normal. Anterior cerebral arteries: Normal. Middle cerebral arteries: Normal. Posterior communicating arteries: Present bilaterally. Posterior cerebral arteries: Normal. Basilar artery: Normal. Vertebral arteries: Left dominant. Normal. Superior cerebellar arteries: Normal. Anterior inferior cerebellar arteries: Normal. Posterior inferior cerebellar arteries: Normal. MRA NECK FINDINGS The carotid and vertebral arteries are normal. Limited visualization of the neck soft tissues is unremarkable. There is a normal 3 vessel aortic arch and normal bilateral proximal subclavian arteries.  IMPRESSION: 1. Normal MRI of the brain. 2. Normal MRA of the head and neck. Electronically Signed   By: Ulyses Jarred M.D.   On: 05/07/2016 22:38    Procedures Procedures (including critical care time)  Medications Ordered in ED Medications  gadobenate dimeglumine (MULTIHANCE) injection 18 mL (18 mLs Intravenous Contrast Given 05/07/16 2150)  dexamethasone (DECADRON) injection 10 mg (10 mg Intravenous Given 05/07/16 2333)  metoCLOPramide (REGLAN) injection 10 mg (10 mg Intravenous Given 05/07/16 2333)     Initial Impression / Assessment and Plan / ED Course  I have reviewed the triage vital signs and the nursing notes.  Pertinent labs & imaging results that were available during my care of the patient were reviewed by me and considered in my medical decision making (see chart for details).  Clinical Course    8:49 PM MRI and MRA ordered to further evaluate for evidence of potential stroke given findings of cholesterol embolus on ophthalmic exam today. Will continue to monitor until study results. Laboratory work up today has been reassuring.  10:44 PM MRI and MRA reviewed. No abnormal findings noted. ABCD2 score calculated to be 2 which is c/w low stroke risk.  11:05 PM Case discussed with Dr. Leonel Ramsay of neurology. Dr. Leonel Ramsay states that no dopplers are indicated as patient has had an MRA which is more than adequate to assess her carotids. Given lack of abnormalities on brain MRI and head/neck MRA, he suspects that the patient's vision changes may be associated with an ocular migraine or other type of migraine headache. Dr. Leonel Ramsay notes that any plaque that may be present systemically would unlikely travel ONLY to the patient's left ocular vasculature; therefore a systemic plaque is unlikely. Dr. Leonel Ramsay does not believe further inpatient work up is indicated, but he does recommend PCP f/u as well as outpatient neurology follow up and repeat ophthalmology follow up.    11:30 PM Results reviewed with the patient including normal MRI and MRA. I have reiterated her reassuring cholesterol levels; therefore, I do not believe statin therapy is indicated. Plan to treat with Reglan and Decadron for migraine management. I have advised that she follow up with the aforementioned specialties and return for any new or concerning symptoms. Patient agreeable to plan with no unaddressed concerns; discharged in satisfactory condition.   Final Clinical Impressions(s) / ED Diagnoses   Final diagnoses:  Acute loss of vision, left  Other type of migraine    New Prescriptions New Prescriptions   BUTALBITAL-ACETAMINOPHEN-CAFFEINE (FIORICET) 50-325-40 MG TABLET    Take 1-2 tablets by mouth every 8 (eight) hours as needed for headache.     Antonietta Breach, PA-C 05/07/16 [place], MD 05/07/16 [phone]

## 2016-05-07 NOTE — ED Triage Notes (Signed)
Pt states seen by [facility]. States "ollenhorst plaques dx by Op[facility]. Requesting MRI.

## 2016-05-07 NOTE — Discharge Instructions (Signed)
You had an MRI of your brain and MRA of your head and neck today which were all reassuring. Your cholesterol panel was normal, other than for low HDL (good cholesterol). The rest of your laboratory work up today was reassuring.   We suspect that your vision changes may be due to a migraine headache as there is no evidence of a clot anywhere in your head or neck. We recommend that you follow-up with your eye doctor if you continue to experience vision changes. We also advise that you see your primary care doctor to discuss your visit to the emergency department today as well as to have your blood pressure rechecked. See a neurologist for further evaluation of your headaches. You may take Fioricet as prescribed for persistent headache. Return to the emergency department for any new or concerning symptoms.

## 2016-05-07 NOTE — ED Triage Notes (Signed)
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 Pt states intermittent loss of vision in L eye since last night. Pt complaining of headache in frontal region, states behind eye since Wednesday.

## 2016-05-08 ENCOUNTER — Emergency Department (HOSPITAL_COMMUNITY)
Admission: EM | Admit: 2016-05-08 | Discharge: 2016-05-08 | Disposition: A | Discharge status: Home / Self Care | Payer: BLUE CROSS/BLUE SHIELD | Attending: Emergency Medicine | Admitting: Emergency Medicine

## 2016-05-08 ENCOUNTER — Encounter (HOSPITAL_COMMUNITY): Payer: Self-pay

## 2016-05-08 ENCOUNTER — Emergency Department (HOSPITAL_COMMUNITY): Payer: BLUE CROSS/BLUE SHIELD

## 2016-05-08 DIAGNOSIS — Z79899 Other long term (current) drug therapy: Secondary | ICD-10-CM | POA: Insufficient documentation

## 2016-05-08 DIAGNOSIS — R200 Anesthesia of skin: Secondary | ICD-10-CM | POA: Diagnosis not present

## 2016-05-08 DIAGNOSIS — Z7982 Long term (current) use of aspirin: Secondary | ICD-10-CM | POA: Insufficient documentation

## 2016-05-08 DIAGNOSIS — Z87891 Personal history of nicotine dependence: Secondary | ICD-10-CM | POA: Insufficient documentation

## 2016-05-08 DIAGNOSIS — R791 Abnormal coagulation profile: Secondary | ICD-10-CM | POA: Insufficient documentation

## 2016-05-08 LAB — CBC
HCT: 35.5 % — ABNORMAL LOW (ref 36.0–46.0)
Hemoglobin: 11.6 g/dL — ABNORMAL LOW (ref 12.0–15.0)
MCH: 21.3 pg — ABNORMAL LOW (ref 26.0–34.0)
MCHC: 32.7 g/dL (ref 30.0–36.0)
MCV: 65.3 fL — ABNORMAL LOW (ref 78.0–100.0)
Platelets: 332 10*3/uL (ref 150–400)
RBC: 5.44 MIL/uL — ABNORMAL HIGH (ref 3.87–5.11)
RDW: 15.6 % — ABNORMAL HIGH (ref 11.5–15.5)
WBC: 15.9 10*3/uL — ABNORMAL HIGH (ref 4.0–10.5)

## 2016-05-08 LAB — APTT: aPTT: 35 seconds (ref 24–36)

## 2016-05-08 LAB — COMPREHENSIVE METABOLIC PANEL
ALT: 22 U/L (ref 14–54)
AST: 23 U/L (ref 15–41)
Albumin: 4.2 g/dL (ref 3.5–5.0)
Alkaline Phosphatase: 47 U/L (ref 38–126)
Anion gap: 9 (ref 5–15)
BUN: 13 mg/dL (ref 6–20)
CO2: 21 mmol/L — ABNORMAL LOW (ref 22–32)
Calcium: 9.7 mg/dL (ref 8.9–10.3)
Chloride: 107 mmol/L (ref 101–111)
Creatinine, Ser: 0.73 mg/dL (ref 0.44–1.00)
GFR calc Af Amer: >60 mL/min (ref >60)
GFR calc non Af Amer: >60 mL/min (ref >60)
Glucose, Bld: 154 mg/dL — ABNORMAL HIGH (ref 65–99)
Potassium: 3.6 mmol/L (ref 3.5–5.1)
Sodium: 137 mmol/L (ref 135–145)
Total Bilirubin: 0.8 mg/dL (ref 0.3–1.2)
Total Protein: 7.7 g/dL (ref 6.5–8.1)

## 2016-05-08 LAB — DIFFERENTIAL
Basophils Absolute: 0 10*3/uL (ref 0.0–0.1)
Basophils Relative: 0 %
Eosinophils Absolute: 0 10*3/uL (ref 0.0–0.7)
Eosinophils Relative: 0 %
Lymphocytes Relative: 5 %
Lymphs Abs: 0.8 10*3/uL (ref 0.7–4.0)
Monocytes Absolute: 0.5 10*3/uL (ref 0.1–1.0)
Monocytes Relative: 3 %
Neutro Abs: 14.6 10*3/uL — ABNORMAL HIGH (ref 1.7–7.7)
Neutrophils Relative %: 92 %

## 2016-05-08 LAB — I-STAT CHEM 8, ED
BUN: 15 mg/dL (ref 6–20)
Calcium, Ion: 1.22 mmol/L (ref 1.15–1.40)
Chloride: 103 mmol/L (ref 101–111)
Creatinine, Ser: 0.7 mg/dL (ref 0.44–1.00)
Glucose, Bld: 149 mg/dL — ABNORMAL HIGH (ref 65–99)
HCT: 38 % (ref 36.0–46.0)
Hemoglobin: 12.9 g/dL (ref 12.0–15.0)
Potassium: 3.6 mmol/L (ref 3.5–5.1)
Sodium: 141 mmol/L (ref 135–145)
TCO2: 23 mmol/L (ref 0–100)

## 2016-05-08 LAB — PROTIME-INR
INR: 1.11
Prothrombin Time: 14.3 seconds (ref 11.4–15.2)

## 2016-05-08 LAB — CBG MONITORING, ED: Glucose-Capillary: 127 mg/dL — ABNORMAL HIGH (ref 65–99)

## 2016-05-08 LAB — I-STAT TROPONIN, ED: Troponin i, poc: 0 ng/mL (ref 0.00–0.08)

## 2016-05-08 MED ORDER — MORPHINE SULFATE (PF) 4 MG/ML IV SOLN
4.0000 mg | Freq: Once | INTRAVENOUS | Status: AC
  Administered 2016-05-08: 4 mg via INTRAVENOUS
  Filled 2016-05-08: qty 1

## 2016-05-08 MED ORDER — KETOROLAC TROMETHAMINE 15 MG/ML IJ SOLN
15.0000 mg | Freq: Once | INTRAMUSCULAR | Status: AC
Start: 2016-05-08 — End: 2016-05-08
  Administered 2016-05-08: 15 mg via INTRAVENOUS
  Filled 2016-05-08: qty 1

## 2016-05-08 NOTE — ED Notes (Signed)
Patient states she was here last night for possible blood clots in her head.   Patient states had MRI last night and was sent home.   Patient states she is now having numbness in L arm.   Triage pulled patient back before I could further evaluate.

## 2016-05-08 NOTE — Consult Note (Signed)
Requesting Physician: ED M.D.    Chief Complaint: Left arm decreased sensation  History obtained from:  Patient    HPI:                                                                                                                                         [name] is an 45 y.o. female who was just at the hospital yesterday. She presented to the ED secondary to noticing some worsening of her headache in the morning approximate around 11 AM yesterday. Per note she began to have vision changes. Those changes are described as visual loss in the left eye. She stated that that last for approximately 10-15 minutes and then spontaneously resolved.  During yesterday's emergency department visit patient underwent a CT of her head which was normal she then underwent an MRA of her neck, head along with a MRI of her brain. All these studies returned normal. Patient returns to the ED at approximately 1520 secondary to having a sudden onset of left arm decreased sensation that started approximately 1 hour ago. CT of head was obtained and showed no etiology for the numbness in her left arm. During examination patient was jovial and making jokes. Examination did not reveal any lateralizing or localizing objective findings. She continued to have subjective decreased sensation in her left arm that began just above her forearm and was circumferential around her forearm to her hand. She states that she was awake when this happened and was watching TV. Patient denies any headache at this time and denies any visual changes at this time.  Date last known well: Date: 05/08/2016 Time last known well: Time: 14:00 tPA Given: No: Minimal symptoms NIHSS of 1  Past Medical History:  Diagnosis Date  . Achalasia    S/p Heller myotomy 1991 and numerous dilations  . GERD (gastroesophageal reflux disease)    Hx - None since 2013 Heller myotomy surgery  . Pleural effusion 2013   with Heller myotomy surgery    Past Surgical  History:  Procedure Laterality Date  . ACHALASIA SURGERY  1991   transthoracic Heller myotomy in [place]  . BILATERAL SALPINGECTOMY Bilateral 12/02/2013   Procedure: BILATERAL SALPINGECTOMY;  Surgeon: Princess Bruins, MD;  Location: [facility];  Service: Gynecology;  Laterality: Bilateral;  . CESAREAN SECTION     x2  . ESOPHAGOGASTRODUODENOSCOPY  10/08/2011   Procedure: ESOPHAGOGASTRODUODENOSCOPY (EGD);  Surgeon: Jeryl Columbia, MD;  Location: [facility];  Service: Endoscopy;  Laterality: N/A;  . ESOPHAGOGASTRODUODENOSCOPY  10/16/2011   Procedure: ESOPHAGOGASTRODUODENOSCOPY (EGD);  Surgeon: Cleotis Nipper, MD;  Location: [facility];  Service: Endoscopy;  Laterality: N/A;  . ESOPHAGOGASTRODUODENOSCOPY  11/06/2011   Procedure: ESOPHAGOGASTRODUODENOSCOPY (EGD);  Surgeon: Lear Ng, MD;  Location: [facility];  Service: Endoscopy;  Laterality: N/A;  . FOREIGN BODY REMOVAL  10/08/2011   Procedure: FOREIGN BODY REMOVAL;  Surgeon: Caryl Bis  Magod, MD;  Location: [facility];  Service: Endoscopy;  Laterality: N/A;  botox  needed /ja/magod  . FOREIGN BODY REMOVAL  10/16/2011   Procedure: FOREIGN BODY REMOVAL;  Surgeon: Cleotis Nipper, MD;  Location: [facility];  Service: Endoscopy;  Laterality: N/A;  . HELLER MYOTOMY  11/07/2011   Procedure: LAPAROSCOPIC HELLER MYOTOMY;  Surgeon: Pedro Earls, MD;  Location: [facility];  Service: General;  Laterality: N/A;  Laparoscopic Redo Heller Myotomy   . ROBOTIC ASSISTED TOTAL HYSTERECTOMY N/A 12/02/2013   Procedure: ROBOTIC ASSISTED TOTAL HYSTERECTOMY;  Surgeon: Princess Bruins, MD;  Location: [facility];  Service: Gynecology;  Laterality: N/A;  . TONSILLECTOMY      Family History  Problem Relation Age of Onset  . Hypertension Mother   . Cancer Paternal Grandmother     breast  . Anesthesia problems Neg Hx   . Hypotension Neg Hx   . Malignant hyperthermia Neg Hx   . Pseudochol deficiency Neg Hx    Social History:  reports that she quit smoking about  25 years ago. Her smoking use included Cigarettes. She has a 0.12 pack-year smoking history. She has never used smokeless tobacco. She reports that she drinks alcohol. She reports that she does not use drugs.  Allergies: No Known Allergies  Medications:                                                                                                                           No current facility-administered medications for this encounter.    Current Outpatient Prescriptions  Medication Sig Dispense Refill  . butalbital-acetaminophen-caffeine (FIORICET) 50-325-40 MG tablet Take 1-2 tablets by mouth every 8 (eight) hours as needed for headache. 20 tablet 0  . ibuprofen (ADVIL,MOTRIN) 800 MG tablet Take 800 mg by mouth every 8 (eight) hours as needed for fever.    . indomethacin (INDOCIN) 25 MG capsule TAKE ONE TABLET THREE TIMES DAILY X 10 DAYS (Patient not taking: Reported on 05/07/2016) 30 capsule 0     ROS:                                                                                                                                       History obtained from the patient  General ROS: negative for - chills, fatigue, fever, night sweats, weight gain or weight loss Psychological ROS: negative for - behavioral disorder, hallucinations,  memory difficulties, mood swings or suicidal ideation Ophthalmic ROS: negative for - blurry vision, double vision, eye pain or loss of vision ENT ROS: negative for - epistaxis, nasal discharge, oral lesions, sore throat, tinnitus or vertigo Allergy and Immunology ROS: negative for - hives or itchy/watery eyes Hematological and Lymphatic ROS: negative for - bleeding problems, bruising or swollen lymph nodes Endocrine ROS: negative for - galactorrhea, hair pattern changes, polydipsia/polyuria or temperature intolerance Respiratory ROS: negative for - cough, hemoptysis, shortness of breath or wheezing Cardiovascular ROS: negative for - chest pain, dyspnea on  exertion, edema or irregular heartbeat Gastrointestinal ROS: negative for - abdominal pain, diarrhea, hematemesis, nausea/vomiting or stool incontinence Genito-Urinary ROS: negative for - dysuria, hematuria, incontinence or urinary frequency/urgency Musculoskeletal ROS: negative for - joint swelling or muscular weakness Neurological ROS: as noted in HPI Dermatological ROS: negative for rash and skin lesion changes  Neurologic Examination:                                                                                                      Blood pressure 186/97, pulse 117, temperature 98.5 F (36.9 C), temperature source Oral, resp. rate 18, last menstrual period 11/17/2013, SpO2 97 %.  HEENT-  Normocephalic, no lesions, without obvious abnormality.  Normal external eye and conjunctiva.  Normal TM's bilaterally.  Normal auditory canals and external ears. Normal external nose, mucus membranes and septum.  Normal pharynx. Cardiovascular- S1, S2 normal, pulses palpable throughout   Lungs- chest clear, no wheezing, rales, normal symmetric air entry Abdomen- normal findings: bowel sounds normal Extremities- no edema Lymph-no adenopathy palpable Musculoskeletal-no joint tenderness, deformity or swelling Skin-dry  Neurological Examination Mental Status: Alert, oriented, thought content appropriate.  Speech fluent without evidence of aphasia.  Able to follow 3 step commands without difficulty. Cranial Nerves: II: Discs flat bilaterally; Visual fields grossly normal, pupils equal, round, reactive to light and accommodation III,IV, VI: ptosis not present, extra-ocular motions intact bilaterally V,VII: smile symmetric, facial light touch sensation normal bilaterally VIII: hearing normal bilaterally IX,X: uvula rises symmetrically XI: bilateral shoulder shrug XII: midline tongue extension Motor: Right : Upper extremity   5/5    Left:     Upper extremity   5/5  Lower extremity   5/5     Lower  extremity   5/5 Tone and bulk:normal tone throughout; no atrophy noted Sensory: Decreased sensation on the left arm from mid bicep to hand. The decrease sensation is stated to be circumferential and follows no dermatome. Deep Tendon Reflexes: 2+ and symmetric throughout Plantars: Right: downgoing   Left: downgoing Cerebellar: normal finger-to-nose, normal rapid alternating movements and normal heel-to-shin test Gait: Not tested       Lab Results: Basic Metabolic Panel:  Recent Labs Lab 05/07/16 1612 05/07/16 1629 05/08/16 1519  NA 137 140 141  K 3.7 3.7 3.6  CL 103 104 103  CO2 24  --   --   GLUCOSE 110* 110* 149*  BUN 14 16 15   CREATININE 0.80 0.80 0.70  CALCIUM 9.8  --   --     Liver  Function Tests:  Recent Labs Lab 05/07/16 1612  AST 23  ALT 20  ALKPHOS 48  BILITOT 0.8  PROT 7.6  ALBUMIN 4.5   No results for input(s): LIPASE, AMYLASE in the last 168 hours. No results for input(s): AMMONIA in the last 168 hours.  CBC:  Recent Labs Lab 05/07/16 1612 05/07/16 1629 05/08/16 1519  WBC 10.1  --   --   NEUTROABS 7.3  --   --   HGB 11.3* 12.9 12.9  HCT 35.0* 38.0 38.0  MCV 65.4*  --   --   PLT 297  --   --     Cardiac Enzymes: No results for input(s): CKTOTAL, CKMB, CKMBINDEX, TROPONINI in the last 168 hours.  Lipid Panel:  Recent Labs Lab 05/07/16 1612  CHOL 140  TRIG 129  HDL 33*  CHOLHDL 4.2  VLDL 26  LDLCALC 81    CBG: No results for input(s): GLUCAP in the last 168 hours.  Microbiology: Results for orders placed or performed during the hospital encounter of 10/27/11  Culture, blood (routine x 2)     Status: None   Collection Time: 10/28/11  1:15 AM  Result Value Ref Range Status   Specimen Description BLOOD RIGHT ANTECUBITAL  Final   Special Requests BOTTLES DRAWN AEROBIC AND ANAEROBIC 5CC  Final   Culture  Setup Time [id number]  Final   Culture NO GROWTH 5 DAYS  Final   Report Status 11/03/2011 FINAL  Final  Culture,  blood (routine x 2)     Status: None   Collection Time: 10/28/11  1:25 AM  Result Value Ref Range Status   Specimen Description BLOOD RIGHT HAND  Final   Special Requests   Final    BOTTLES DRAWN AEROBIC AND ANAEROBIC 5CC AEROBIC/4CC ANAEROBIC   Culture  Setup Time [id number]  Final   Culture NO GROWTH 5 DAYS  Final   Report Status 11/03/2011 FINAL  Final  Culture, routine-abscess     Status: None   Collection Time: 10/29/11  3:19 PM  Result Value Ref Range Status   Specimen Description ABSCESS  Final   Special Requests NONE  Final   Gram Stain   Final    FEW WBC PRESENT,BOTH PMN AND MONONUCLEAR NO SQUAMOUS EPITHELIAL CELLS SEEN NO ORGANISMS SEEN   Culture NO GROWTH 3 DAYS  Final   Report Status 11/02/2011 FINAL  Final  Body fluid culture     Status: None   Collection Time: 11/02/11  2:04 PM  Result Value Ref Range Status   Specimen Description PLEURAL  Final   Special Requests Normal  Final   Gram Stain NO WBC SEEN NO ORGANISMS SEEN  Final   Culture NO GROWTH 3 DAYS  Final   Report Status 11/06/2011 FINAL  Final  Surgical pcr screen     Status: None   Collection Time: 11/07/11  7:39 AM  Result Value Ref Range Status   MRSA, PCR NEGATIVE NEGATIVE Final   Staphylococcus aureus NEGATIVE NEGATIVE Final    Comment:        The Xpert SA Assay (FDA approved for NASAL specimens only), is one component of a comprehensive surveillance program.  It is not intended to diagnose infection nor to guide or monitor treatment.    Coagulation Studies:  Recent Labs  05/07/16 1612  LABPROT 13.9  INR 1.07    Imaging: Ct Head Wo Contrast  Result Date: 05/07/2016 CLINICAL DATA:  Headaches. Intermittent loss of vision in left eye. Frontal  headaches. EXAM: CT HEAD WITHOUT CONTRAST TECHNIQUE: Contiguous axial images were obtained from the base of the skull through the vertex without intravenous contrast. COMPARISON:  None. FINDINGS: Brain: No acute infarct, hemorrhage, or mass  lesion is present. The ventricles are of normal size. No significant extraaxial fluid collection is present. Vascular: No significant atherosclerotic calcifications are present. There are no hyperdense vessels. The dural sinuses are within normal limits. Skull: Within normal limits Sinuses/Orbits: The visualized paranasal sinuses and mastoid air cells are clear. The visualized globes and orbits are within normal limits. Other: No significant extracranial soft tissue lesion is present. IMPRESSION: Negative CT of the head. Electronically Signed   By: San Morelle M.D.   On: 05/07/2016 16:46   Mr Jodene Nam Head Wo Contrast  Result Date: 05/07/2016 CLINICAL DATA:  Loss of vision left eye.  Frontal headache. EXAM: MR HEAD WITHOUT AND WITH CONTRAST MR CIRCLE OF WILLIS WITHOUT CONTRAST MRA OF THE NECK WITHOUT AND WITH CONTRAST TECHNIQUE: Multiplanar, multiecho pulse sequences of the brain and surrounding structures were obtained according to standard protocol without and with intravenous contrast; Multiplanar and multiecho pulse sequences of the neck were obtained without and with intravenous contrast. Angiographic images of the neck were obtained using MRA technique without and with intravenous contast.; Angiographic images of the Circle of Willis were obtained using MRA technique without intravenous contrast. CONTRAST:  61mL MULTIHANCE GADOBENATE DIMEGLUMINE 529 MG/ML IV SOLN COMPARISON:  Head CT performed earlier the same day. FINDINGS: MR HEAD FINDINGS Brain Parenchyma: No acute infarct or intraparenchymal hemorrhage. No focal parenchymal signal abnormality. No mass lesion or midline shift. The major intracranial flow voids are preserved. The midline structures are normal. Ventricles, Sulci and Extra-axial Spaces: Normal for age. No extra-axial collection. Paranasal Sinuses and Mastoids: No fluid levels or advanced mucosal thickening. Orbits: Normal. Bones and Soft Tissues: The visualized skull base, calvarium  and extracranial soft tissues are normal. MR CIRCLE OF WILLIS FINDINGS Intracranial internal carotid arteries: Normal. Anterior cerebral arteries: Normal. Middle cerebral arteries: Normal. Posterior communicating arteries: Present bilaterally. Posterior cerebral arteries: Normal. Basilar artery: Normal. Vertebral arteries: Left dominant. Normal. Superior cerebellar arteries: Normal. Anterior inferior cerebellar arteries: Normal. Posterior inferior cerebellar arteries: Normal. MRA NECK FINDINGS The carotid and vertebral arteries are normal. Limited visualization of the neck soft tissues is unremarkable. There is a normal 3 vessel aortic arch and normal bilateral proximal subclavian arteries. IMPRESSION: 1. Normal MRI of the brain. 2. Normal MRA of the head and neck. Electronically Signed   By: Ulyses Jarred M.D.   On: 05/07/2016 22:38   Mr Angiogram Neck W Or Wo Contrast  Result Date: 05/07/2016 CLINICAL DATA:  Loss of vision left eye.  Frontal headache. EXAM: MR HEAD WITHOUT AND WITH CONTRAST MR CIRCLE OF WILLIS WITHOUT CONTRAST MRA OF THE NECK WITHOUT AND WITH CONTRAST TECHNIQUE: Multiplanar, multiecho pulse sequences of the brain and surrounding structures were obtained according to standard protocol without and with intravenous contrast; Multiplanar and multiecho pulse sequences of the neck were obtained without and with intravenous contrast. Angiographic images of the neck were obtained using MRA technique without and with intravenous contast.; Angiographic images of the Circle of Willis were obtained using MRA technique without intravenous contrast. CONTRAST:  87mL MULTIHANCE GADOBENATE DIMEGLUMINE 529 MG/ML IV SOLN COMPARISON:  Head CT performed earlier the same day. FINDINGS: MR HEAD FINDINGS Brain Parenchyma: No acute infarct or intraparenchymal hemorrhage. No focal parenchymal signal abnormality. No mass lesion or midline shift. The major intracranial flow voids are preserved.  The midline structures are  normal. Ventricles, Sulci and Extra-axial Spaces: Normal for age. No extra-axial collection. Paranasal Sinuses and Mastoids: No fluid levels or advanced mucosal thickening. Orbits: Normal. Bones and Soft Tissues: The visualized skull base, calvarium and extracranial soft tissues are normal. MR CIRCLE OF WILLIS FINDINGS Intracranial internal carotid arteries: Normal. Anterior cerebral arteries: Normal. Middle cerebral arteries: Normal. Posterior communicating arteries: Present bilaterally. Posterior cerebral arteries: Normal. Basilar artery: Normal. Vertebral arteries: Left dominant. Normal. Superior cerebellar arteries: Normal. Anterior inferior cerebellar arteries: Normal. Posterior inferior cerebellar arteries: Normal. MRA NECK FINDINGS The carotid and vertebral arteries are normal. Limited visualization of the neck soft tissues is unremarkable. There is a normal 3 vessel aortic arch and normal bilateral proximal subclavian arteries. IMPRESSION: 1. Normal MRI of the brain. 2. Normal MRA of the head and neck. Electronically Signed   By: Ulyses Jarred M.D.   On: 05/07/2016 22:38   Mr Jeri Cos [id number] Contrast  Result Date: 05/07/2016 CLINICAL DATA:  Loss of vision left eye.  Frontal headache. EXAM: MR HEAD WITHOUT AND WITH CONTRAST MR CIRCLE OF WILLIS WITHOUT CONTRAST MRA OF THE NECK WITHOUT AND WITH CONTRAST TECHNIQUE: Multiplanar, multiecho pulse sequences of the brain and surrounding structures were obtained according to standard protocol without and with intravenous contrast; Multiplanar and multiecho pulse sequences of the neck were obtained without and with intravenous contrast. Angiographic images of the neck were obtained using MRA technique without and with intravenous contast.; Angiographic images of the Circle of Willis were obtained using MRA technique without intravenous contrast. CONTRAST:  47mL MULTIHANCE GADOBENATE DIMEGLUMINE 529 MG/ML IV SOLN COMPARISON:  Head CT performed earlier the same day.  FINDINGS: MR HEAD FINDINGS Brain Parenchyma: No acute infarct or intraparenchymal hemorrhage. No focal parenchymal signal abnormality. No mass lesion or midline shift. The major intracranial flow voids are preserved. The midline structures are normal. Ventricles, Sulci and Extra-axial Spaces: Normal for age. No extra-axial collection. Paranasal Sinuses and Mastoids: No fluid levels or advanced mucosal thickening. Orbits: Normal. Bones and Soft Tissues: The visualized skull base, calvarium and extracranial soft tissues are normal. MR CIRCLE OF WILLIS FINDINGS Intracranial internal carotid arteries: Normal. Anterior cerebral arteries: Normal. Middle cerebral arteries: Normal. Posterior communicating arteries: Present bilaterally. Posterior cerebral arteries: Normal. Basilar artery: Normal. Vertebral arteries: Left dominant. Normal. Superior cerebellar arteries: Normal. Anterior inferior cerebellar arteries: Normal. Posterior inferior cerebellar arteries: Normal. MRA NECK FINDINGS The carotid and vertebral arteries are normal. Limited visualization of the neck soft tissues is unremarkable. There is a normal 3 vessel aortic arch and normal bilateral proximal subclavian arteries. IMPRESSION: 1. Normal MRI of the brain. 2. Normal MRA of the head and neck. Electronically Signed   By: Ulyses Jarred M.D.   On: 05/07/2016 22:38       Assessment and plan discussed with with attending physician and they are in agreement.    Etta Quill PA-C [facility] [phone]  05/08/2016, 3:25 PM   Assessment: 45 y.o. female   Stroke Risk Factors - none   Neurology Attending Addendum:  This patient was seen, examined, and d/w PA. I have reviewed the note and agree with the findings, assessment and plan as documented with the following additions.   History is as per PA note. On exam, the patient reports numbness in the left arm and indicates that this involves the left hand along the ulnar aspect as well as  the left thumb and index finger. This also seemed to involve the left upper extremity in a  patchy distribution. The remainder of her elemental neurologic examination is unremarkable.  I have personally and independently reviewed the CT scan of the head without contrast from 05/08/16. This is normal. This was discussed directly with the interpreting neuroradiologist at the time of this consultation.  I have personally and independently reviewed the MRI of the brain with and without contrast from 05/07/16. This is notable for minimal punctate T2/flair hyperintensities in the bihemispheric white matter. These are nonspecific in nature and could be seen with migraine headache or hypertension. No acute abnormalities noted. There is no abnormal enhancement.  I have personally and independently reviewed the MRA of the head without contrast from 05/07/16. This is normal.  I personally and independently reviewed the MRA of the neck with and without contrast on 04/29/16. This is normal.  Impression: 1. Left upper extremity numbness: This is patchy and somewhat indistinct on her examination. This is subjective in nature with limited objective findings. Given the pattern noted on examination with patchy involvement of the hand and forearm, this seems unlikely to be related to cerebral ischemia. She just had MRI of the brain and MRA of the head and neck which were unremarkable. These do not need to be repeated. Recommend echocardiogram to complete evaluation since she has now had 2 episodes of focal neurologic symptoms. Symptoms could ultimately be indicative of underlying migraine phenomenon, although this is a diagnosis of exclusion.

## 2016-05-08 NOTE — ED Notes (Signed)
Called [facility] to activate code stroke

## 2016-05-08 NOTE — ED Notes (Signed)
CBG 127 upon arrival to Rm 5

## 2016-05-08 NOTE — ED Triage Notes (Signed)
Patient here with left arm numbness x 1 hour. Was sen in ED last night for left visual changes and patient states that she was told her MRI was ok and discharged home. Today no visual changes, alert and oriented, no gait change, no slurred speech

## 2016-05-08 NOTE — Discharge Instructions (Signed)
Follow-up with neurology 

## 2016-05-11 ENCOUNTER — Ambulatory Visit: Payer: Self-pay | Admitting: Neurology

## 2016-05-15 ENCOUNTER — Ambulatory Visit (INDEPENDENT_AMBULATORY_CARE_PROVIDER_SITE_OTHER): Payer: Self-pay | Admitting: Diagnostic Neuroimaging

## 2016-05-15 ENCOUNTER — Telehealth: Payer: Self-pay | Admitting: *Deleted

## 2016-05-15 ENCOUNTER — Encounter: Payer: Self-pay | Admitting: Diagnostic Neuroimaging

## 2016-05-15 VITALS — BP 137/83 | HR 85 | Ht 66.0 in | Wt 213.6 lb

## 2016-05-15 DIAGNOSIS — I10 Essential (primary) hypertension: Secondary | ICD-10-CM

## 2016-05-15 DIAGNOSIS — G453 Amaurosis fugax: Secondary | ICD-10-CM

## 2016-05-15 DIAGNOSIS — G458 Other transient cerebral ischemic attacks and related syndromes: Secondary | ICD-10-CM

## 2016-05-15 DIAGNOSIS — R0683 Snoring: Secondary | ICD-10-CM

## 2016-05-15 NOTE — Patient Instructions (Signed)
Thank you for coming to see [place] at [facility]. I hope we have been able to provide you high quality care today.  You may receive a patient satisfaction survey over the next few weeks. We would appreciate your feedback and comments so that we may continue to improve ourselves and the health of our patients.  - continue aspirin 95m daily - monitor BP at home; follow up with PCP in next 1-2 months - I will setup sleep study, echocardiogram heart and cardiac monitor   ~~~~~~~~~~~~~~~~~~~~~~~~~~~~~~~~~~~~~~~~~~~~~~~~~~~~~~~~~~~~~~~~~  DR. Eulis Salazar'S GUIDE TO HAPPY AND HEALTHY LIVING These are some of my general health and wellness recommendations. Some of them may apply to you better than others. Please use common sense as you try these suggestions and feel free to ask me any questions.   ACTIVITY/FITNESS Mental, social, emotional and physical stimulation are very important for brain and body health. Try learning a new activity (arts, music, language, sports, games).  Keep moving your body to the best of your abilities. You can do this at home, inside or outside, the park, community center, gym or anywhere you like. Consider a physical therapist or personal trainer to get started. Consider the app Sworkit. Fitness trackers such as smart-watches, smart-phones or Fitbits can help as well.   NUTRITION Eat more plants: colorful vegetables, nuts, seeds and berries.  Eat less sugar, salt, preservatives and processed foods.  Avoid toxins such as cigarettes and alcohol.  Drink water when you are thirsty. Warm water with a slice of lemon is an excellent morning drink to start the day.  Consider these websites for more information The [employer] (hhttps://www.henry-hernandez.biz/ [employer] (wWindowBlog.ch   RELAXATION Consider practicing mindfulness meditation or other relaxation techniques such as deep breathing,  prayer, yoga, tai chi, massage. See website mindful.org or the apps Headspace or Calm to help get started.   SLEEP Try to get at least 7-8+ hours sleep per day. Regular exercise and reduced caffeine will help you sleep better. Practice good sleep hygeine techniques. See website sleep.org for more information.   PLANNING Prepare estate planning, living will, healthcare POA documents. Sometimes this is best planned with the help of an attorney. Theconversationproject.org and agingwithdignity.org are excellent resources.

## 2016-05-15 NOTE — Progress Notes (Signed)
[facility]  PATIENT: [name] DOB: 11-May-1971  REFERRING CLINICIAN: Deborah Chalk, K HISTORY FROM: patient and husband  REASON FOR VISIT: new consult    HISTORICAL  CHIEF COMPLAINT:  Chief Complaint  Patient presents with  . Visual Field Change    rm 6, New Pt, ED referral, "acute left eye vision loss"; " eye dr suggested [place] plaques on left eye"    HISTORY OF PRESENT ILLNESS:   45 year old right-handed female here for evaluation of transient visual loss, transient left arm weakness and numbness, abnormal funduscopic exam.  05/01/2016 patient had onset of generalized pressure headache in the frontal and bilateral sinus regions. Patient had some mild photophobia and phonophobia. No nausea vomiting. Sometimes she was seeing "shooting lights" in her left eye. Patient never had headaches like this before in her life.  05/06/16 patient had sudden onset of "curtain coming down over left eye" lasting 10 minutes at a time. Patient had complete vision loss in the left eye. After 10 minutes her vision returned to normal. Within 1 hour patient had a second episode again lasting 10 minutes of similar symptoms.  05/07/16 patient had one more attack while on the way to the ophthalmologist of left eye transient visual loss. Patient went to ophthalmologist who noted ": Hoarse plaques" in the left eye and then referred patient to the emergency room for evaluation. Patient had MRI of the brain, MRA head and neck which showed no acute findings or no significant stenosis. Lipid panel was unremarkable. Blood pressure was significantly elevated at 174/102. Patient was evaluated and discharged home.  05/08/16 patient had sudden onset of left arm and left hand numbness and weakness lasting for 2 hours. Patient went to the emergency room for evaluation. Blood pressure was 186/97. Patient had neurology consultation and was started on low-dose aspirin 81 mg daily.  Since that time no  further events or attacks.    REVIEW OF SYSTEMS: Full 14 system review of systems performed and negative with exception of: Loss of vision. Snoring.  ALLERGIES: Allergies  Allergen Reactions  . Shellfish Allergy Hives    HOME MEDICATIONS: Outpatient Medications Prior to Visit  Medication Sig Dispense Refill  . butalbital-acetaminophen-caffeine (FIORICET) 50-325-40 MG tablet Take 1-2 tablets by mouth every 8 (eight) hours as needed for headache. 20 tablet 0  . ibuprofen (ADVIL,MOTRIN) 800 MG tablet Take 800 mg by mouth every 8 (eight) hours as needed for fever.    . indomethacin (INDOCIN) 25 MG capsule TAKE ONE TABLET THREE TIMES DAILY X 10 DAYS 30 capsule 0   No facility-administered medications prior to visit.     PAST MEDICAL HISTORY: Past Medical History:  Diagnosis Date  . Achalasia    S/p Heller myotomy 1991 and numerous dilations  . GERD (gastroesophageal reflux disease)    Hx - None since 2013 Heller myotomy surgery  . Headache    migraines  . Pleural effusion 2013   with Heller myotomy surgery    PAST SURGICAL HISTORY: Past Surgical History:  Procedure Laterality Date  . ACHALASIA SURGERY  1991   transthoracic Heller myotomy in [place]  . BILATERAL SALPINGECTOMY Bilateral 12/02/2013   Procedure: BILATERAL SALPINGECTOMY;  Surgeon: Princess Bruins, MD;  Location: [facility];  Service: Gynecology;  Laterality: Bilateral;  . CESAREAN SECTION     x2  . ESOPHAGOGASTRODUODENOSCOPY  10/08/2011   Procedure: ESOPHAGOGASTRODUODENOSCOPY (EGD);  Surgeon: Jeryl Columbia, MD;  Location: [facility] ENDOSCOPY;  Service: Endoscopy;  Laterality: N/A;  . ESOPHAGOGASTRODUODENOSCOPY  10/16/2011  Procedure: ESOPHAGOGASTRODUODENOSCOPY (EGD);  Surgeon: Cleotis Nipper, MD;  Location: [facility];  Service: Endoscopy;  Laterality: N/A;  . ESOPHAGOGASTRODUODENOSCOPY  11/06/2011   Procedure: ESOPHAGOGASTRODUODENOSCOPY (EGD);  Surgeon: Lear Ng, MD;  Location: [facility];  Service: Endoscopy;   Laterality: N/A;  . FOREIGN BODY REMOVAL  10/08/2011   Procedure: FOREIGN BODY REMOVAL;  Surgeon: Jeryl Columbia, MD;  Location: [facility] ENDOSCOPY;  Service: Endoscopy;  Laterality: N/A;  botox  needed /ja/magod  . FOREIGN BODY REMOVAL  10/16/2011   Procedure: FOREIGN BODY REMOVAL;  Surgeon: Cleotis Nipper, MD;  Location: [facility] ENDOSCOPY;  Service: Endoscopy;  Laterality: N/A;  . HELLER MYOTOMY  11/07/2011   Procedure: LAPAROSCOPIC HELLER MYOTOMY;  Surgeon: Pedro Earls, MD;  Location: [facility];  Service: General;  Laterality: N/A;  Laparoscopic Redo Heller Myotomy   . ROBOTIC ASSISTED TOTAL HYSTERECTOMY N/A 12/02/2013   Procedure: ROBOTIC ASSISTED TOTAL HYSTERECTOMY;  Surgeon: Princess Bruins, MD;  Location: [facility];  Service: Gynecology;  Laterality: N/A;  . TONSILLECTOMY      FAMILY HISTORY: Family History  Problem Relation Age of Onset  . Hypertension Mother   . Cancer Paternal Grandmother     breast  . Anesthesia problems Neg Hx   . Hypotension Neg Hx   . Malignant hyperthermia Neg Hx   . Pseudochol deficiency Neg Hx     SOCIAL HISTORY:  Social History   Social History  . Marital status: Married    Spouse name: Matt  . Number of children: 2  . Years of education: 14   Occupational History  .  [employer]   Social History Main Topics  . Smoking status: Former Smoker    Packs/day: 0.25    Years: 0.50    Types: Cigarettes    Quit date: 10/07/1990  . Smokeless tobacco: Never Used     Comment: Smoked for 2 months, nothing heavier  . Alcohol use Yes     Comment: Rarely  . Drug use: No  . Sexual activity: Yes    Birth control/ protection: None   Other Topics Concern  . Not on file   Social History Narrative   Lives at home with husband, working and goes to school, has 2 kids. Very minimal smoking history, very rare alcohol.    Caffeine -rarely     PHYSICAL EXAM  GENERAL EXAM/CONSTITUTIONAL: Vitals:  Vitals:   05/15/16 1117  BP: 137/83  Pulse: 85    Weight: 213 lb 9.6 oz (96.9 kg)  Height: 5\' 6"  (1.676 m)     Body mass index is 34.48 kg/m.  Visual Acuity Screening   Right eye Left eye Both eyes  Without correction:     With correction: 20/30 20/30   Comments: 05/15/16 wears contacts    Patient is in no distress; well developed, nourished and groomed; neck is supple  CARDIOVASCULAR:  Examination of carotid arteries is normal; no carotid bruits  Regular rate and rhythm, no murmurs  Examination of peripheral vascular system by observation and palpation is normal  EYES:  Ophthalmoscopic exam of optic discs and posterior segments is normal; no papilledema or hemorrhages  MUSCULOSKELETAL:  Gait, strength, tone, movements noted in Neurologic exam below  NEUROLOGIC: MENTAL STATUS:  No flowsheet data found.  awake, alert, oriented to person, place and time  recent and remote memory intact  normal attention and concentration  language fluent, comprehension intact, naming intact,   fund of knowledge appropriate  CRANIAL NERVE:   2nd -  no papilledema on fundoscopic exam  2nd, 3rd, 4th, 6th - pupils equal and reactive to light, visual fields full to confrontation, extraocular muscles intact, no nystagmus  5th - facial sensation symmetric  7th - facial strength symmetric  8th - hearing intact  9th - palate elevates symmetrically, uvula midline  11th - shoulder shrug symmetric  12th - tongue protrusion midline  MOTOR:   normal bulk and tone, full strength in the BUE, BLE  SENSORY:   normal and symmetric to light touch, temperature, vibration  COORDINATION:   finger-nose-finger, fine finger movements normal  REFLEXES:   deep tendon reflexes present and symmetric  GAIT/STATION:   narrow based gait; able to walk tandem; romberg is negative     DIAGNOSTIC DATA (LABS, IMAGING, TESTING) - I reviewed patient records, labs, notes, testing and imaging myself where available.  Lab Results   Component Value Date   WBC 15.9 (H) 05/08/2016   HGB 12.9 05/08/2016   HCT 38.0 05/08/2016   MCV 65.3 (L) 05/08/2016   PLT 332 05/08/2016      Component Value Date/Time   NA 141 05/08/2016 1519   K 3.6 05/08/2016 1519   CL 103 05/08/2016 1519   CO2 21 (L) 05/08/2016 1504   GLUCOSE 149 (H) 05/08/2016 1519   BUN 15 05/08/2016 1519   CREATININE 0.70 05/08/2016 1519   CREATININE 0.78 07/01/2014 1019   CALCIUM 9.7 05/08/2016 1504   PROT 7.7 05/08/2016 1504   ALBUMIN 4.2 05/08/2016 1504   AST 23 05/08/2016 1504   ALT 22 05/08/2016 1504   ALKPHOS 47 05/08/2016 1504   BILITOT 0.8 05/08/2016 1504   GFRNONAA >60 05/08/2016 1504   GFRNONAA >89 07/01/2014 1019   GFRAA >60 05/08/2016 1504   GFRAA >89 07/01/2014 1019   Lab Results  Component Value Date   CHOL 140 05/07/2016   HDL 33 (L) 05/07/2016   LDLCALC 81 05/07/2016   TRIG 129 05/07/2016   CHOLHDL 4.2 05/07/2016   No results found for: HGBA1C Lab Results  Component Value Date   [id number] 10/19/2011   Lab Results  Component Value Date   TSH 3.51 11/29/2011    05/10/16 EKG [I reviewed images myself and agree with interpretation. -VRP]  - no STEMI - normal sinus rhythm   05/07/16 MRI brain [I reviewed images myself and agree with interpretation. Subtle right frontal foci of non-specific gliosis. -VRP]  - Normal MRI of the brain.  05/07/16 MRA head / neck [I reviewed images myself and agree with interpretation. -VRP]  - Normal MRA of the head and neck.  07/05/14 TTE / echocardiogram - Left ventricle: The cavity size was mildly dilated. Wall thickness was increased in a pattern of mild LVH. Systolicfunction was normal. The estimated ejection fraction was in therange of 55% to 60%. - Mitral valve: There was mild regurgitation. - Left atrium: The atrium was mildly dilated. - Atrial septum: No defect or patent foramen ovale was identified.    ASSESSMENT AND PLAN  45 y.o. year old female here with history of  paracardial effusion and cardiomyopathy, here with 3 episodes of transient left eye visual loss lasting 10 minutes each, as well as transient left hand and arm numbness weakness lasted for 2 hours, in the setting of accelerated hypertension. Findings concerning for multiple transient ischemic attacks. We'll complete workup with sleep study, echocardiogram of the heart, cardiac event monitor. Continue aspirin 81 mg daily. Monitor blood pressure at home and follow-up with PCP for further evaluation.  Dx:   1. Amaurosis fugax   2. Other specified transient cerebral ischemias   3. Snoring   4. Accelerated hypertension      PLAN: - continue aspirin 81mg  daily - monitor BP at home; follow up with PCP in next 1-2 months - I will setup sleep study, echocardiogram heart and cardiac monitor  Orders Placed This Encounter  Procedures  . Ambulatory referral to Sleep Studies  . Cardiac event monitor  . ECHOCARDIOGRAM COMPLETE   Return in about 3 months (around 08/14/2016).    Penni Bombard, MD [id number], [id number] PM Certified in Neurology, Neurophysiology and [facility] [address], [place], [place] [address] [phone]

## 2016-05-15 NOTE — Telephone Encounter (Signed)
Per Dr Leta Baptist, spoke with Claiborne Billings, Dr B Bowen's office and requested patient's records be faxed over. Dr Nickolas Madrid is referring provider. Claiborne Billings stated she would fax.

## 2016-05-20 NOTE — ED Provider Notes (Signed)
[facility] [facility] DEPT Provider Note   CSN: [id number] Arrival date & time: 05/08/16  1451     History   Chief Complaint Chief Complaint  Patient presents with  . Weakness  . Numbness    HPI [name] is a 45 y.o. female.  HPI  [name] is an 45 y.o. female who was just at the hospital yesterday. She presented to the ED secondary to noticing some worsening of her headache in the morning approximate around 11 AM yesterday. Per note she began to have vision changes. Those changes are described as visual loss in the left eye. She stated that that last for approximately 10-15 minutes and then spontaneously resolved.  During yesterday's emergency department visit patient underwent a CT of her head which was normal she then underwent an MRA of her neck, head along with a MRI of her brain. All these studies returned normal. Patient returns to the ED at approximately 1520 secondary to having a sudden onset of left arm decreased sensation that started approximately 1 hour ago. CT of head was obtained and showed no etiology for the numbness in her left arm. During examination patient was jovial and making jokes. Examination did not reveal any lateralizing or localizing objective findings. She continued to have subjective decreased sensation in her left arm that began just above her forearm and was circumferential around her forearm to her hand. She states that she was awake when this happened and was watching TV. Patient denies any headache at this time and denies any visual changes at this time.  Past Medical History:  Diagnosis Date  . Achalasia    S/p Heller myotomy 1991 and numerous dilations  . GERD (gastroesophageal reflux disease)    Hx - None since 2013 Heller myotomy surgery  . Headache    migraines  . Pleural effusion 2013   with Heller myotomy surgery    Patient Active Problem List   Diagnosis Date Noted  . Postoperative state 12/02/2013  . Chest pain 11/29/2011  . D-dimer,  elevated 10/30/2011  . Aspiration pneumonia ([place]) 10/28/2011  . Achalasia 10/18/2011  . Leukocytosis 10/18/2011  . Thrombocytosis ([place]) 10/18/2011  . Pericardial effusion 10/18/2011  . Pleural effusion, left 10/18/2011  . Microcytic anemia 10/18/2011  . Tachycardia 10/18/2011  . SIRS (systemic inflammatory response syndrome) ([place]) 10/18/2011    Past Surgical History:  Procedure Laterality Date  . ACHALASIA SURGERY  1991   transthoracic Heller myotomy in [place]  . BILATERAL SALPINGECTOMY Bilateral 12/02/2013   Procedure: BILATERAL SALPINGECTOMY;  Surgeon: Princess Bruins, MD;  Location: [facility];  Service: Gynecology;  Laterality: Bilateral;  . CESAREAN SECTION     x2  . ESOPHAGOGASTRODUODENOSCOPY  10/08/2011   Procedure: ESOPHAGOGASTRODUODENOSCOPY (EGD);  Surgeon: Jeryl Columbia, MD;  Location: [facility] ENDOSCOPY;  Service: Endoscopy;  Laterality: N/A;  . ESOPHAGOGASTRODUODENOSCOPY  10/16/2011   Procedure: ESOPHAGOGASTRODUODENOSCOPY (EGD);  Surgeon: Cleotis Nipper, MD;  Location: [facility];  Service: Endoscopy;  Laterality: N/A;  . ESOPHAGOGASTRODUODENOSCOPY  11/06/2011   Procedure: ESOPHAGOGASTRODUODENOSCOPY (EGD);  Surgeon: Lear Ng, MD;  Location: [facility];  Service: Endoscopy;  Laterality: N/A;  . FOREIGN BODY REMOVAL  10/08/2011   Procedure: FOREIGN BODY REMOVAL;  Surgeon: Jeryl Columbia, MD;  Location: [facility] ENDOSCOPY;  Service: Endoscopy;  Laterality: N/A;  botox  needed /ja/magod  . FOREIGN BODY REMOVAL  10/16/2011   Procedure: FOREIGN BODY REMOVAL;  Surgeon: Cleotis Nipper, MD;  Location: [facility];  Service: Endoscopy;  Laterality: N/A;  .  HELLER MYOTOMY  11/07/2011   Procedure: LAPAROSCOPIC HELLER MYOTOMY;  Surgeon: Pedro Earls, MD;  Location: [facility];  Service: General;  Laterality: N/A;  Laparoscopic Redo Heller Myotomy   . ROBOTIC ASSISTED TOTAL HYSTERECTOMY N/A 12/02/2013   Procedure: ROBOTIC ASSISTED TOTAL HYSTERECTOMY;  Surgeon: Princess Bruins, MD;  Location:  [facility];  Service: Gynecology;  Laterality: N/A;  . TONSILLECTOMY      OB History    No data available       Home Medications    Prior to Admission medications   Medication Sig Start Date End Date Taking? Authorizing Provider  aspirin EC 81 MG tablet Take 81 mg by mouth daily.    Historical Provider, MD  butalbital-acetaminophen-caffeine (FIORICET) 50-325-40 MG tablet Take 1-2 tablets by mouth every 8 (eight) hours as needed for headache. Patient not taking: Reported on 05/15/2016 05/07/16 05/07/17  Antonietta Breach, PA-C  EPINEPHrine 0.3 mg/0.3 mL IJ SOAJ injection Inject into the muscle. As needed for shrimp allergy 08/26/15   Historical Provider, MD  ibuprofen (ADVIL,MOTRIN) 800 MG tablet Take 800 mg by mouth every 8 (eight) hours as needed for fever.    Historical Provider, MD    Family History Family History  Problem Relation Age of Onset  . Hypertension Mother   . Cancer Paternal Grandmother     breast  . Anesthesia problems Neg Hx   . Hypotension Neg Hx   . Malignant hyperthermia Neg Hx   . Pseudochol deficiency Neg Hx     Social History Social History  Substance Use Topics  . Smoking status: Former Smoker    Packs/day: 0.25    Years: 0.50    Types: Cigarettes    Quit date: 10/07/1990  . Smokeless tobacco: Never Used     Comment: Smoked for 2 months, nothing heavier  . Alcohol use Yes     Comment: Rarely     Allergies   Shellfish allergy   Review of Systems Review of Systems  All systems reviewed and negative, other than as noted in HPI.   Physical Exam Updated Vital Signs BP 153/90   Pulse 80   Temp 98.9 F (37.2 C)   Resp 13   LMP 11/17/2013   SpO2 100%   Physical Exam  Constitutional: She is oriented to person, place, and time. She appears well-developed and well-nourished. No distress.  HENT:  Head: Normocephalic and atraumatic.  Eyes: Conjunctivae are normal.  Neck: Neck supple.  Cardiovascular: Normal rate and regular rhythm.   No murmur  heard. Pulmonary/Chest: Effort normal and breath sounds normal. No respiratory distress.  Abdominal: Soft. There is no tenderness.  Musculoskeletal: She exhibits no edema.  Neurological: She is alert and oriented to person, place, and time. No cranial nerve deficit. She exhibits normal muscle tone. Coordination normal.  Skin: Skin is warm and dry.  Psychiatric: She has a normal mood and affect.  Nursing note and vitals reviewed.    ED Treatments / Results  Labs (all labs ordered are listed, but only abnormal results are displayed) Labs Reviewed  CBC - Abnormal; Notable for the following:       Result Value   WBC 15.9 (*)    RBC 5.44 (*)    Hemoglobin 11.6 (*)    HCT 35.5 (*)    MCV 65.3 (*)    MCH 21.3 (*)    RDW 15.6 (*)    All other components within normal limits  DIFFERENTIAL - Abnormal; Notable for the following:  Neutro Abs 14.6 (*)    All other components within normal limits  COMPREHENSIVE METABOLIC PANEL - Abnormal; Notable for the following:    CO2 21 (*)    Glucose, Bld 154 (*)    All other components within normal limits  CBG MONITORING, ED - Abnormal; Notable for the following:    Glucose-Capillary 127 (*)    All other components within normal limits  I-STAT CHEM 8, ED - Abnormal; Notable for the following:    Glucose, Bld 149 (*)    All other components within normal limits  PROTIME-INR  APTT  I-STAT TROPOININ, ED    EKG  EKG Interpretation  Date/Time:  Tuesday May 08 2016 16:11:18 EDT Ventricular Rate:  93 PR Interval:    QRS Duration: 82 QT Interval:  359 QTC Calculation: 447 R Axis:   59 Text Interpretation:  Sinus rhythm Confirmed by Christy Gentles  MD, DONALD ([id number]) on 05/09/2016 3:13:50 PM       Radiology No results found.  Procedures Procedures (including critical care time)  Medications Ordered in ED Medications  morphine 4 MG/ML injection 4 mg (4 mg Intravenous Given 05/08/16 1750)  ketorolac (TORADOL) 15 MG/ML injection 15 mg  (15 mg Intravenous Given 05/08/16 1750)     Initial Impression / Assessment and Plan / ED Course  I have reviewed the triage vital signs and the nursing notes.  Pertinent labs & imaging results that were available during my care of the patient were reviewed by me and considered in my medical decision making (see chart for details).  Clinical Course    34y female with intermittent numbness. Now resolved. Extensive workup this far as been unremarkable. This may potentially become to get a migraine. She is treated symptomatically with some improvement. I feel she is appropriate for outpatient treatment at this time.  Final Clinical Impressions(s) / ED Diagnoses   Final diagnoses:  Numbness    New Prescriptions Discharge Medication List as of 05/08/2016  6:53 PM       Virgel Manifold, MD 05/20/16 [phone]

## 2016-06-01 ENCOUNTER — Telehealth: Payer: Self-pay | Admitting: Cardiology

## 2016-06-01 NOTE — Telephone Encounter (Signed)
Message sent to ordering provider: I have attempted to call pt several times with no return call. please have the patient call our office to get schedule.   06/01/2016 LMOM for pt to call and schedule Echo appt. stpegram  05/24/2016 LMOM for pt to call and schedule echo and event monitor. stpegram   05/17/2016 LMOM for pt to call and schedule Echo appt . stpegram   Thanks  [employer]

## 2016-06-01 NOTE — Telephone Encounter (Signed)
error 

## 2016-08-08 ENCOUNTER — Telehealth (HOSPITAL_COMMUNITY): Payer: Self-pay | Admitting: Diagnostic Neuroimaging

## 2016-08-08 NOTE — Telephone Encounter (Signed)
Pt was contacted on 9/7,9/4,and 06/01/16 to get set up for an event monitor and echocardiogram. She never returned any calls regarding these test, so the patient will be removed from the workqueue.

## 2016-08-15 ENCOUNTER — Ambulatory Visit: Payer: Self-pay | Admitting: Diagnostic Neuroimaging

## 2018-03-05 DIAGNOSIS — R600 Localized edema: Secondary | ICD-10-CM | POA: Diagnosis present

## 2018-03-05 DIAGNOSIS — Z5321 Procedure and treatment not carried out due to patient leaving prior to being seen by health care provider: Secondary | ICD-10-CM | POA: Diagnosis not present

## 2018-03-06 ENCOUNTER — Other Ambulatory Visit: Payer: Self-pay

## 2018-03-06 ENCOUNTER — Emergency Department (HOSPITAL_COMMUNITY)
Admission: EM | Admit: 2018-03-06 | Discharge: 2018-03-06 | Discharge status: Against Medical Advice | Payer: Managed Care, Other (non HMO) | Attending: Emergency Medicine | Admitting: Emergency Medicine

## 2018-03-06 ENCOUNTER — Encounter (HOSPITAL_COMMUNITY): Payer: Self-pay | Admitting: Emergency Medicine

## 2018-03-06 HISTORY — DX: Essential (primary) hypertension: I10

## 2018-03-06 NOTE — ED Triage Notes (Signed)
Pt presents with pointer finger swelling around the base of her cuticle. No redness noted, but swelling present. No injury. Patient attempted warm water soaks and soaked it in alcohol. No pus noted. Patient able to move finger.

## 2018-03-07 NOTE — ED Notes (Signed)
Follow up call made  Will follow up w pcp  Will returned to ed if needed  03/07/18  0942  s Kaelah Hayashi rn

## 2018-10-28 ENCOUNTER — Ambulatory Visit (INDEPENDENT_AMBULATORY_CARE_PROVIDER_SITE_OTHER): Payer: Managed Care, Other (non HMO)

## 2018-10-28 ENCOUNTER — Other Ambulatory Visit: Payer: Self-pay

## 2018-10-28 VITALS — BP 162/98 | HR 74 | Ht 66.0 in | Wt 230.0 lb

## 2018-10-28 DIAGNOSIS — Z1239 Encounter for other screening for malignant neoplasm of breast: Secondary | ICD-10-CM

## 2018-10-28 DIAGNOSIS — R590 Localized enlarged lymph nodes: Secondary | ICD-10-CM

## 2018-10-28 DIAGNOSIS — Z0000 Encounter for general adult medical examination without abnormal findings: Secondary | ICD-10-CM | POA: Diagnosis not present

## 2018-10-28 DIAGNOSIS — N644 Mastodynia: Secondary | ICD-10-CM

## 2018-10-28 DIAGNOSIS — R030 Elevated blood-pressure reading, without diagnosis of hypertension: Secondary | ICD-10-CM

## 2018-10-28 DIAGNOSIS — Z1231 Encounter for screening mammogram for malignant neoplasm of breast: Secondary | ICD-10-CM

## 2018-10-28 NOTE — Patient Instructions (Signed)
Mammogram  A mammogram is an X-ray of the breasts that is done to check for abnormal changes. This procedure can screen for and detect any changes that may suggest breast cancer. A mammogram can also identify other changes and variations in the breast, such as:   Inflammation of the breast tissue (mastitis).   An infected area that contains a collection of pus (abscess).   A fluid-filled sac (cyst).   Fibrocystic changes. This is when breast tissue becomes denser, which can make the tissue feel rope-like or uneven under the skin.   Tumors that are not cancerous (benign).  Tell a health care provider about:   Any allergies you have.   If you have breast implants.   If you have had previous breast disease, biopsy, or surgery.   If you are breastfeeding.   Any possibility that you could be pregnant, if this applies.   If you are younger than age 25.   If you have a family history of breast cancer.  What are the risks?  Generally, this is a safe procedure. However, problems may occur, including:   Exposure to radiation. Radiation levels are very low with this test.   The results being misinterpreted.   The need for further tests.   The inability of the mammogram to detect certain cancers.  What happens before the procedure?   Schedule your test about 1-2 weeks after your menstrual period. This is usually when your breasts are the least tender.   If you have had a mammogram done at a different facility in the past, get the mammogram X-rays or have them sent to your current exam facility in order to compare them.   Wash your breasts and under your arms the day of the test.   Do not wear deodorants, perfumes, lotions, or powders anywhere on your body on the day of the test.   Remove any jewelry from your neck.   Wear clothes that you can change into and out of easily.  What happens during the procedure?   You will undress from the waist up and put on a gown.   You will stand in front of the X-ray  machine.   Each breast will be placed between two plastic or glass plates. The plates will compress your breast for a few seconds. Try to stay as relaxed as possible during the procedure. This does not cause any harm to your breasts and any discomfort you feel will be very brief.   X-rays will be taken from different angles of each breast.  The procedure may vary among health care providers and hospitals.  What happens after the procedure?   The mammogram will be examined by a specialist (radiologist).   You may need to repeat certain parts of the test, depending on the quality of the images. This is commonly done if the radiologist needs a better view of the breast tissue.   Ask when your test results will be ready. Make sure you get your test results.   You may resume your normal activities.  Summary   A mammogram is an X-ray of the breasts that is done to check for abnormal changes. This procedure can screen for and detect any changes that may suggest breast cancer.   If you have had a mammogram done at a different facility in the past, get the mammogram X-rays or have them sent to your current exam facility in order to compare them.   Ask when your test   results will be ready. Make sure you get your test results.  This information is not intended to replace advice given to you by your health care provider. Make sure you discuss any questions you have with your health care provider.  Document Released: 08/24/2000 Document Revised: 04/11/2017 Document Reviewed: 11/05/2014  [employer]vier Interactive Patient Education  2019 [employer]

## 2018-10-28 NOTE — Progress Notes (Signed)
History:  [name] is a 48 y.o. No obstetric history on file. who presents to clinic today for initial annual well woman exam.  Patient states she has had a total hysterectomy with BTL in 2015.  She denies vaginal concerns including bleeding and discharge.  She reports daily SBE and denies breast concerns.  She endorses a family history of breast cancer stating her paternal grandmother died of the disease ~30 years ago.  Patient reports that her last mammogram was 2 years ago and was normal. Patient endorses safety at home.   The following portions of the patient's history were reviewed and updated as appropriate: allergies, current medications, family history, past medical history, social history, past surgical history and problem list.  Review of Systems:  Review of Systems  Gastrointestinal: Negative for abdominal pain, constipation and diarrhea.  Genitourinary: Negative for dysuria.  Neurological: Negative for headaches.  All other systems reviewed and are negative.     Objective:  Physical Exam BP (!) 162/98 (BP Location: Left Arm)   Pulse 74   Ht 5\' 6"  (1.676 m)   Wt 230 lb (104.3 kg)   LMP 11/17/2013   BMI 37.12 kg/m  Physical Exam Chaperone present: Deferred.  Constitutional:      Appearance: Normal appearance.  HENT:     Head: Normocephalic and atraumatic.     Mouth/Throat:     Mouth: Mucous membranes are moist.     Pharynx: Oropharynx is clear.  Eyes:     Pupils: Pupils are equal, round, and reactive to light.  Neck:     Musculoskeletal: Normal range of motion.  Cardiovascular:     Rate and Rhythm: Normal rate and regular rhythm.     Pulses: Normal pulses.     Heart sounds: Normal heart sounds.  Pulmonary:     Effort: Pulmonary effort is normal.     Breath sounds: Normal breath sounds.  Chest:     Breasts:        Right: Mass (1x1cm at 3'o'clock-Tender to palpation) and tenderness (At 3'o'clock and 8'o'clock) present.        Left: No mass or tenderness.   Abdominal:     General: Bowel sounds are normal.  Musculoskeletal: Normal range of motion.  Lymphadenopathy:     Upper Body:     Left upper body: Axillary adenopathy (Soft, Nontender, 6x4cm) present.  Skin:    General: Skin is warm and dry.  Neurological:     Mental Status: She is alert and oriented to person, place, and time.      Labs and Imaging No results found for this or any previous visit (from the past 24 hour(s)).  No results found.   Assessment & Plan:   1. Well woman exam without gynecological exam -Breast exam completed with findings as below. -Discussed healthy lifestyle choices including proper diet and exercise.  2. Screening breast examination -Abnormal findings on clinical breast exam today. -Discussed findings and history obtained as documented -Mammogram scheduled for November 28, 2018 at 1540  3. 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 -Encouraged to monitor and inform if continues to get larger or with onset of new symptoms.  5. Elevated BP without diagnosis of hypertension -Currently on low dose aspirin, but not taking regularly -Will follow up with PCP   [name], [place] 10/28/2018 2:08 PM

## 2018-10-29 ENCOUNTER — Encounter: Payer: Managed Care, Other (non HMO) | Admitting: Obstetrics & Gynecology

## 2018-11-25 ENCOUNTER — Encounter (INDEPENDENT_AMBULATORY_CARE_PROVIDER_SITE_OTHER): Payer: Self-pay

## 2018-11-25 ENCOUNTER — Other Ambulatory Visit: Payer: Self-pay

## 2018-11-25 ENCOUNTER — Ambulatory Visit
Admission: RE | Admit: 2018-11-25 | Discharge: 2018-11-25 | Disposition: A | Discharge status: Home / Self Care | Payer: Managed Care, Other (non HMO) | Source: Ambulatory Visit

## 2018-11-25 DIAGNOSIS — Z1231 Encounter for screening mammogram for malignant neoplasm of breast: Secondary | ICD-10-CM

## 2018-11-27 ENCOUNTER — Other Ambulatory Visit: Payer: Self-pay

## 2018-11-27 DIAGNOSIS — R928 Other abnormal and inconclusive findings on diagnostic imaging of breast: Secondary | ICD-10-CM

## 2018-11-28 ENCOUNTER — Ambulatory Visit: Payer: Managed Care, Other (non HMO)

## 2018-12-01 ENCOUNTER — Ambulatory Visit
Admission: RE | Admit: 2018-12-01 | Discharge: 2018-12-01 | Disposition: A | Discharge status: Home / Self Care | Payer: Managed Care, Other (non HMO) | Source: Ambulatory Visit

## 2018-12-01 ENCOUNTER — Other Ambulatory Visit: Payer: Self-pay

## 2018-12-01 ENCOUNTER — Ambulatory Visit: Payer: Managed Care, Other (non HMO)

## 2018-12-01 DIAGNOSIS — C50919 Malignant neoplasm of unspecified site of unspecified female breast: Secondary | ICD-10-CM

## 2018-12-01 DIAGNOSIS — R928 Other abnormal and inconclusive findings on diagnostic imaging of breast: Secondary | ICD-10-CM

## 2018-12-01 HISTORY — DX: Malignant neoplasm of unspecified site of unspecified female breast: C50.919

## 2018-12-02 NOTE — Progress Notes (Signed)
Patient called.  Offered counseling and guidance for results.  Patient declines questions at current.  Reports feeling well.  Encouraged to call or contact via Mychart with any questions or concerns that may present.  Patient verbalized understanding. JE

## 2018-12-05 ENCOUNTER — Telehealth: Payer: Self-pay | Admitting: Oncology

## 2018-12-05 NOTE — Telephone Encounter (Signed)
SPOKE TO PATIENT TO confirm afternoon [facility] appointment for 4/1, packet emailed to patient

## 2018-12-08 ENCOUNTER — Encounter: Payer: Self-pay | Admitting: *Deleted

## 2018-12-08 ENCOUNTER — Other Ambulatory Visit: Payer: Self-pay | Admitting: *Deleted

## 2018-12-08 DIAGNOSIS — D0511 Intraductal carcinoma in situ of right breast: Secondary | ICD-10-CM

## 2018-12-08 NOTE — Progress Notes (Signed)
g

## 2018-12-09 ENCOUNTER — Encounter: Payer: Self-pay | Admitting: Oncology

## 2018-12-09 NOTE — Progress Notes (Signed)
[place]  Telephone:([phone] [phone]) [phone]     ID: [name] DOB: [birth year]  MR#: [id number]  RAQ#:[id number]  Patient Care Team: Zara Chess, NP as PCP - General (Nurse Practitioner) Mauro Kaufmann, RN as Oncology Nurse Navigator Rockwell Germany, RN as Oncology Nurse Navigator Kyung Rudd, MD as Consulting Physician (Radiation Oncology) Aleczander Fandino, Virgie Dad, MD as Consulting Physician (Oncology) Rolm Bookbinder, MD as Consulting Physician (General Surgery) [name], CNM as Consulting Physician (Obstetrics and Gynecology) Chauncey Cruel, MD OTHER MD:  CHIEF COMPLAINT: Ductal carcinoma in situ, estrogen receptor positive  CURRENT TREATMENT: Tamoxifen   HISTORY OF CURRENT ILLNESS: [name] presented for wellness exam with her OBGYN and was found to have a palpable 1 cm mass at 3 o'clock in the right breast. Axillary adenopathy was also noted in the left side, which however the patient states has been present for years. She proceeded to screening mammography at Dr. Delena Bali office on 11/25/2018 showing possible abnormalities in the right and left breasts. She underwent bilateral diagnostic mammography with tomography at [facility] on 12/01/2018 showing: breast density category B; indeterminate 6 mm group of calcifications within the upper outer right breast; no persistent abnormality in the area of the left breast.  Accordingly on 12/01/2018 she proceeded to biopsy of the right breast area in question. The pathology from this procedure ([id number]) showed: ductal carcinoma in situ with calcifications, high grade. Prognostic indicators significant for: estrogen receptor, 100% positive and progesterone receptor, 100% positive, both with strong staining intensity.    The patient's subsequent history is as detailed below.   INTERVAL HISTORY: [name] was evaluated in the multidisciplinary breast cancer clinic on 12/10/2018. Her case was also  presented at the multidisciplinary breast cancer conference on the same day. At that time a preliminary plan was proposed: Breast conserving surgery, and antiestrogens.  Genetics.  Radiation to be postponed given the current pandemic   REVIEW OF SYSTEMS: [name] reports doing well overall. On patient questionnaire, she reports wearing glasses/contacts, heartburn, breast lump, joint pain, and hot flashes. There were no specific symptoms leading to the original mammogram, which was routinely scheduled. The patient denies unusual headaches, visual changes, nausea, vomiting, stiff neck, dizziness, or gait imbalance. There has been no cough, phlegm production, or pleurisy, no chest pain or pressure, and no change in bowel or bladder habits. The patient denies fever, rash, bleeding, unexplained fatigue or unexplained weight loss. A detailed review of systems was otherwise entirely negative.   PAST MEDICAL HISTORY: Past Medical History:  Diagnosis Date   Achalasia    S/p Heller myotomy 1991 and numerous dilations   Breast cancer ([place]) 12/01/2018   right, DCIS   Family history of breast cancer    Family history of prostate cancer    GERD (gastroesophageal reflux disease)    Hx - None since 2013 Heller myotomy surgery   Headache    migraines   Hypertension    Pleural effusion 2013   with Heller myotomy surgery    PAST SURGICAL HISTORY: Past Surgical History:  Procedure Laterality Date   ACHALASIA SURGERY  1991   transthoracic Heller myotomy in [place]   BILATERAL SALPINGECTOMY Bilateral 12/02/2013   Procedure: BILATERAL SALPINGECTOMY;  Surgeon: Princess Bruins, MD;  Location: [facility];  Service: Gynecology;  Laterality: Bilateral;   CESAREAN SECTION     x2   ESOPHAGOGASTRODUODENOSCOPY  10/08/2011   Procedure: ESOPHAGOGASTRODUODENOSCOPY (EGD);  Surgeon: Jeryl Columbia, MD;  Location: [facility]  ENDOSCOPY;  Service: Endoscopy;  Laterality: N/A;   ESOPHAGOGASTRODUODENOSCOPY  10/16/2011    Procedure: ESOPHAGOGASTRODUODENOSCOPY (EGD);  Surgeon: Cleotis Nipper, MD;  Location: [facility];  Service: Endoscopy;  Laterality: N/A;   ESOPHAGOGASTRODUODENOSCOPY  11/06/2011   Procedure: ESOPHAGOGASTRODUODENOSCOPY (EGD);  Surgeon: Lear Ng, MD;  Location: [facility];  Service: Endoscopy;  Laterality: N/A;   FOREIGN BODY REMOVAL  10/08/2011   Procedure: FOREIGN BODY REMOVAL;  Surgeon: Jeryl Columbia, MD;  Location: [facility] ENDOSCOPY;  Service: Endoscopy;  Laterality: N/A;  botox  needed [place] BODY REMOVAL  10/16/2011   Procedure: FOREIGN BODY REMOVAL;  Surgeon: Cleotis Nipper, MD;  Location: [facility] ENDOSCOPY;  Service: Endoscopy;  Laterality: N/A;   HELLER MYOTOMY  11/07/2011   Procedure: LAPAROSCOPIC HELLER MYOTOMY;  Surgeon: Pedro Earls, MD;  Location: [facility];  Service: General;  Laterality: N/A;  Laparoscopic Redo Heller Myotomy    ROBOTIC ASSISTED TOTAL HYSTERECTOMY N/A 12/02/2013   Procedure: ROBOTIC ASSISTED TOTAL HYSTERECTOMY;  Surgeon: Princess Bruins, MD;  Location: [facility];  Service: Gynecology;  Laterality: N/A;   TONSILLECTOMY  1995    FAMILY HISTORY Family History  Problem Relation Age of Onset   Hypertension Mother    Prostate cancer Father 24   Breast cancer Paternal Grandmother        late 11s?   Lung cancer Brother 38   Other Maternal Grandmother        Childbirth   Anesthesia problems Neg Hx    Hypotension Neg Hx    Malignant hyperthermia Neg Hx    Pseudochol deficiency Neg Hx    As of April 2020 patient's father is alive at 53 years old. He was diagnosed with prostate cancer around age 58 and is doing well. Patient's mother is also living at age 75. The patient notes a family hx of breast cancer. Her paternal grandmother was diagnosed with breast cancer and died from the disease around 77. The patient denies a family hx of ovarian cancer. She has 2 brothers, no sisters.  GYNECOLOGIC HISTORY:  Patient's last menstrual period  was 11/17/2013. Menarche: 48 years old Age at first live birth: 48 years old [place] P 2 LMP 2013 Contraceptive no HRT no  Hysterectomy? Yes, 12/02/2013 BSO?  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 She is married. Husband Jeneen Rinks is a Administrator. She lives at home with her husband. Daughter Niger, age 33, is a Presenter, broadcasting at [employer]. Daughter Elpidio Galea, age 75, is a Paramedic in high school here in [place].  The patient attends [employer]).     ADVANCED DIRECTIVES: In the absence of any documents to the contrary the patient's husband is her healthcare power of attorney   HEALTH MAINTENANCE: Social History   Tobacco Use   Smoking status: Former Smoker    Packs/day: 0.25    Years: 0.50    Pack years: 0.12    Types: Cigarettes    Last attempt to quit: 10/07/1990    Years since quitting: 28.1   Smokeless tobacco: Never Used   Tobacco comment: Smoked for 2 months, nothing heavier  Substance Use Topics   Alcohol use: Yes    Alcohol/week: 1.0 standard drinks    Types: 1 Glasses of wine per week    Comment: Rarely   Drug use: No     Colonoscopy: never done  PAP: 2013  Bone density: never  done   Allergies  Allergen Reactions   Shellfish Allergy Hives    Current Outpatient Medications  Medication Sig Dispense Refill   lisinopril-hydrochlorothiazide (PRINZIDE,ZESTORETIC) 10-12.5 MG tablet Take 1 tablet by mouth daily.     EPINEPHrine 0.3 mg/0.3 mL IJ SOAJ injection Inject into the muscle. As needed for shrimp allergy     ibuprofen (ADVIL,MOTRIN) 800 MG tablet Take 800 mg by mouth every 8 (eight) hours as needed for fever.     tamoxifen (NOLVADEX) 20 MG tablet Take 1 tablet (20 mg total) by mouth daily for 30 days. 90 tablet 12   No current facility-administered medications for this visit.     OBJECTIVE: Young appearing African-American woman in no acute  distress  Vitals:   12/10/18 1230  BP: (!) 155/79  Pulse: 85  Resp: 20  Temp: 99.3 F (37.4 C)  SpO2: 100%     Body mass index is 37.48 kg/m.   Wt Readings from Last 3 Encounters:  12/10/18 232 lb 3.2 oz (105.3 kg)  10/28/18 230 lb (104.3 kg)  03/05/18 190 lb (86.2 kg)      ECOG FS:0 - Asymptomatic  Ocular: Sclerae unicteric, pupils round and equal Ear-nose-throat: Oropharynx clear and moist Lymphatic: No cervical or supraclavicular adenopathy Lungs no rales or rhonchi Heart regular rate and rhythm Abd soft, nontender, positive bowel sounds MSK no focal spinal tenderness, no joint edema Neuro: non-focal, well-oriented, appropriate affect Breasts: The right breast is status post recent biopsy.  There are no skin or nipple changes of concern.  The left breast is benign.  Both axillae are benign.   LAB RESULTS:  CMP     Component Value Date/Time   NA 139 12/10/2018 1212   K 3.4 (L) 12/10/2018 1212   CL 104 12/10/2018 1212   CO2 23 12/10/2018 1212   GLUCOSE 153 (H) 12/10/2018 1212   BUN 7 12/10/2018 1212   CREATININE 0.96 12/10/2018 1212   CREATININE 0.78 07/01/2014 1019   CALCIUM 9.3 12/10/2018 1212   PROT 8.2 (H) 12/10/2018 1212   ALBUMIN 4.5 12/10/2018 1212   AST 19 12/10/2018 1212   ALT 22 12/10/2018 1212   ALKPHOS 57 12/10/2018 1212   BILITOT 0.9 12/10/2018 1212   GFRNONAA >60 12/10/2018 1212   GFRNONAA >89 07/01/2014 1019   GFRAA >60 12/10/2018 1212   GFRAA >89 07/01/2014 1019    No results found for: TOTALPROTELP, ALBUMINELP, A1GS, A2GS, BETS, BETA2SER, GAMS, MSPIKE, SPEI  No results found for: KPAFRELGTCHN, LAMBDASER, KAPLAMBRATIO  Lab Results  Component Value Date   WBC 8.5 12/10/2018   NEUTROABS 6.2 12/10/2018   HGB 11.6 (L) 12/10/2018   HCT 36.7 12/10/2018   MCV 66.6 (L) 12/10/2018   PLT 274 12/10/2018    _0 @  No results found for: LABCA2  No components found for: ZCHYIF027  No results for input(s): INR in the last 168  hours.  No results found for: LABCA2  No results found for: XAJ287  No results found for: OMV672  No results found for: CNO709  No results found for: CA2729  No components found for: HGQUANT  No results found for: CEA1 / No results found for: CEA1   No results found for: AFPTUMOR  No results found for: CHROMOGRNA  No results found for: PSA1  Appointment on 12/10/2018  Component Date Value Ref Range Status   WBC Count 12/10/2018 8.5  4.0 - 10.5 K/uL Final   RBC 12/10/2018 5.51* 3.87 - 5.11 MIL/uL Final   Hemoglobin 12/10/2018  11.6* 12.0 - 15.0 g/dL Final   HCT 12/10/2018 36.7  36.0 - 46.0 % Final   MCV 12/10/2018 66.6* 80.0 - 100.0 fL Final   MCH 12/10/2018 21.1* 26.0 - 34.0 pg Final   MCHC 12/10/2018 31.6  30.0 - 36.0 g/dL Final   RDW 12/10/2018 16.4* 11.5 - 15.5 % Final   Platelet Count 12/10/2018 274  150 - 400 K/uL Final   nRBC 12/10/2018 0.0  0.0 - 0.2 % Final   Neutrophils Relative % 12/10/2018 73  % Final   Neutro Abs 12/10/2018 6.2  1.7 - 7.7 K/uL Final   Lymphocytes Relative 12/10/2018 20  % Final   Lymphs Abs 12/10/2018 1.7  0.7 - 4.0 K/uL Final   Monocytes Relative 12/10/2018 5  % Final   Monocytes Absolute 12/10/2018 0.4  0.1 - 1.0 K/uL Final   Eosinophils Relative 12/10/2018 1  % Final   Eosinophils Absolute 12/10/2018 0.1  0.0 - 0.5 K/uL Final   Basophils Relative 12/10/2018 0  % Final   Basophils Absolute 12/10/2018 0.0  0.0 - 0.1 K/uL Final   Immature Granulocytes 12/10/2018 1  % Final   Abs Immature Granulocytes 12/10/2018 0.04  0.00 - 0.07 K/uL Final   Performed at [facility], [place] [address], [place], [place] [address]   Sodium 12/10/2018 139  135 - 145 mmol/L Final   Potassium 12/10/2018 3.4* 3.5 - 5.1 mmol/L Final   Chloride 12/10/2018 104  98 - 111 mmol/L Final   CO2 12/10/2018 23  22 - 32 mmol/L Final   Glucose, Bld 12/10/2018 153* 70 - 99 mg/dL Final   BUN 12/10/2018 7  6 - 20 mg/dL Final    Creatinine 12/10/2018 0.96  0.44 - 1.00 mg/dL Final   Calcium 12/10/2018 9.3  8.9 - 10.3 mg/dL Final   Total Protein 12/10/2018 8.2* 6.5 - 8.1 g/dL Final   Albumin 12/10/2018 4.5  3.5 - 5.0 g/dL Final   AST 12/10/2018 19  15 - 41 U/L Final   ALT 12/10/2018 22  0 - 44 U/L Final   Alkaline Phosphatase 12/10/2018 57  38 - 126 U/L Final   Total Bilirubin 12/10/2018 0.9  0.3 - 1.2 mg/dL Final   GFR, Est Non Af Am 12/10/2018 >60  >60 mL/min Final   GFR, Est AFR Am 12/10/2018 >60  >60 mL/min Final   Anion gap 12/10/2018 12  5 - 15 Final   Performed at [facility], [place] [address] [place], [place] [address]    (this displays the last labs from the last 3 days)  No results found for: TOTALPROTELP, ALBUMINELP, A1GS, A2GS, BETS, BETA2SER, GAMS, MSPIKE, SPEI (this displays SPEP labs)  No results found for: KPAFRELGTCHN, LAMBDASER, KAPLAMBRATIO (kappa/lambda light chains)  No results found for: HGBA, HGBA2QUANT, HGBFQUANT, HGBSQUAN (Hemoglobinopathy evaluation)   Lab Results  Component Value Date   LDH 130 10/19/2011    Lab Results  Component Value Date   IRON 18 (L) 10/19/2011   TIBC 170 (L) 10/19/2011   IRONPCTSAT 11 (L) 10/19/2011   (Iron and TIBC)  Lab Results  Component Value Date   FERRITIN 1,113 (H) 10/19/2011    Urinalysis    Component Value Date/Time   COLORURINE YELLOW 10/18/2011 2246   APPEARANCEUR CLEAR 10/18/2011 2246   LABSPEC 1.015 10/18/2011 2246   PHURINE 6.0 10/18/2011 2246   GLUCOSEU NEGATIVE 10/18/2011 2246   [place] (A) 10/18/2011 2246   BILIRUBINUR NEGATIVE 10/18/2011 2246   Benjamin Stain  NEGATIVE 10/18/2011 2246   PROTEINUR NEGATIVE 10/18/2011 2246   UROBILINOGEN 1.0 10/18/2011 2246   NITRITE NEGATIVE 10/18/2011 2246   LEUKOCYTESUR NEGATIVE 10/18/2011 2246     STUDIES: Mm Digital Screening Bilateral  Result Date: 11/26/2018 CLINICAL DATA:  Screening. EXAM: DIGITAL SCREENING BILATERAL MAMMOGRAM WITH CAD  COMPARISON:  None ACR Breast Density Category c: The breast tissue is heterogeneously dense, which may obscure small masses. FINDINGS: In the right breast calcifications require further evaluation. In the left breast a possible mass requires further evaluation. Images were processed with CAD. IMPRESSION: Further evaluation is suggested for calcifications in the right breast. Further evaluation is suggested for possible mass in the left breast. RECOMMENDATION: Diagnostic mammogram and possibly ultrasound of both breasts. (Code:[id number]) The patient will be contacted regarding the findings, and additional imaging will be scheduled. BI-RADS CATEGORY  0: Incomplete. Need additional imaging evaluation and/or prior mammograms for comparison. Electronically Signed   By: Nolon Nations M.D.   On: 11/26/2018 08:11   Mm Diag Breast Tomo Bilateral  Result Date: 12/01/2018 CLINICAL DATA:  48 year old female for further evaluation of RIGHT breast calcifications and possible LEFT breast asymmetry on baseline screening mammogram EXAM: DIGITAL DIAGNOSTIC BILATERAL MAMMOGRAM WITH CAD AND TOMO COMPARISON:  Previous exam(s). ACR Breast Density Category b: There are scattered areas of fibroglandular density. FINDINGS: Full field and magnification views of the RIGHT breast and 2D/3D spot compression views of the LEFT breast are performed. A 6 mm group of heterogeneous calcifications is identified within the posterior UPPER-OUTER RIGHT breast. No persistent abnormalities identified in the area of the LEFT breast screening study finding. Mammographic images were processed with CAD. IMPRESSION: 1. Indeterminate 6 mm group of calcifications within the UPPER-OUTER RIGHT breast. Tissue sampling is recommended. 2. No persistent abnormality in the area of the LEFT breast screening study finding. RECOMMENDATION: Stereotactic guided RIGHT breast biopsy, which will be arranged. I have discussed the findings and recommendations with the  patient. Results were also provided in writing at the conclusion of the visit. If applicable, a reminder letter will be sent to the patient regarding the next appointment. BI-RADS CATEGORY  4: Suspicious. Electronically Signed   By: Margarette Canada M.D.   On: 12/01/2018 11:46   Mm Clip Placement Right  Result Date: 12/01/2018 CLINICAL DATA:  Stereotactic biopsy was performed calcifications in the upper-outer quadrant of the right breast posterior third EXAM: DIAGNOSTIC RIGHT MAMMOGRAM POST STEREOTACTIC BIOPSY COMPARISON:  Previous exam(s). FINDINGS: Mammographic images were obtained following stereotactic guided biopsy of calcifications in the upper-outer right breast. Coil shaped biopsy clip is satisfactorily positioned at the biopsy site. IMPRESSION: Satisfactory position of coil shaped biopsy clip. Final Assessment: Post Procedure Mammograms for Marker Placement Electronically Signed   By: Curlene Dolphin M.D.   On: 12/01/2018 16:10   Mm Rt Breast Bx W Loc Dev 1st Lesion Image Bx Spec Stereo Guide  Addendum Date: 12/02/2018   ADDENDUM REPORT: 12/02/2018 10:27 ADDENDUM: Pathology revealed HIGH GRADE DUCTAL CARCINOMA IN SITU WITH CALCIFICATIONS of the Right breast, upper outer quadrant, posterior third. This was found to be concordant by Dr. Curlene Dolphin. Pathology results were discussed with the patient by telephone. The patient reported doing well after the biopsy with tenderness and a burning sensation at the site. Post biopsy instructions and care were reviewed and questions were answered. The patient was encouraged to call [facility] for any additional concerns. The patient was referred to [facility] on December 10, 2018. Pathology results reported by Terie Purser, RN on 12/02/2018. Electronically Signed   By: Curlene Dolphin M.D.   On: 12/02/2018 10:27   Result Date: 12/02/2018 CLINICAL DATA:  Stereotactic  biopsy was recommended for a 6 mm group of calcifications in the upper-outer right breast, posterior third. EXAM: RIGHT BREAST STEREOTACTIC CORE NEEDLE BIOPSY COMPARISON:  Previous exams. FINDINGS: The patient and I discussed the procedure of stereotactic-guided biopsy including benefits and alternatives. We discussed the high likelihood of a successful procedure. We discussed the risks of the procedure including infection, bleeding, tissue injury, clip migration, and inadequate sampling. Informed written consent was given. The usual time out protocol was performed immediately prior to the procedure. Using sterile technique and 1% Lidocaine with and without epinephrine as local anesthetic, under stereotactic guidance, a 9 gauge vacuum assisted device was used to perform core needle biopsy of calcifications in the upper-outer quadrant of the right breast using a lateral approach. Specimen radiograph was performed showing multiple calcifications. Specimens with calcifications are identified for pathology. Lesion quadrant: Upper outer quadrant At the conclusion of the procedure, a coil tissue marker clip was deployed into the biopsy cavity. Follow-up 2-view mammogram was performed and dictated separately. IMPRESSION: Stereotactic-guided biopsy of the right breast. No apparent complications. Electronically Signed: By: Curlene Dolphin M.D. On: 12/01/2018 16:08    ELIGIBLE FOR AVAILABLE RESEARCH PROTOCOL: no  ASSESSMENT: 48 y.o. [place] woman status post right breast upper outer quadrant biopsy 12/01/2018 for a 0.6 cm ductal carcinoma in situ, grade 3, strongly estrogen and progesterone receptor positive  (1) genetics testing pending  (2) breast conserving surgery pending  (3) tamoxifen started neoadjuvantly on 12/10/2018  (4) surgery to be scheduled as available  PLAN: I spent approximately 60 minutes face to face with [name] with more than 50% of that time spent in counseling and coordination of care.  Specifically we reviewed the biology of the patient's diagnosis and the specifics of her situation.  [name] understands that in noninvasive ductal carcinoma, also called ductal carcinoma in situ ("DCIS") the breast cancer cells remain trapped in the ducts were they started. They cannot travel to a vital organ. For that reason these cancers in themselves are not life-threatening.  If the whole breast is removed then all the ducts are removed and since the cancer cells are trapped in the ducts, the cure rate with mastectomy for noninvasive breast cancer is approximately 99%. Nevertheless we recommend lumpectomy, because there is no survival advantage to mastectomy and because the cosmetic result is generally superior with breast conservation.  Since the patient is keeping her breasts, there will be some risk of recurrence. The recurrence can only be in the same breast since, again, the cells are trapped in the ducts. There is no connection from one breast to the other. The risk of local recurrence is cut by more than half with radiation, which is standard in this situation.  In estrogen receptor positive cancers like [name]'s, anti-estrogens can also be considered. They will further reduce the risk of recurrence by one half. In addition anti-estrogens will lower the risk of a new breast cancer developing in either breast, also by one half. That risk approaches 1% per year. Anti-estrogens reduce it to 1/2%.  [name] does qualify for genetics testing. In patients who carry a deleterious mutation [for example in a  BRCA gene], the risk of a new breast cancer developing in the future may be sufficiently great that the patient may choose  bilateral mastectomies. However if she wishes to keep her breasts in that situation it is safe to do so. That would require intensified screening, which generally means not only yearly mammography but a yearly breast MRI as well.   Normally we would start with surgery and follow with  radiation and then antiestrogens, but in this case, with a pandemic in place, and severely curtailed surgical and radiation facilities, we are going to start with antiestrogens.  Because she still has her ovary in place, by her account, we are going to go with tamoxifen.  Today we discussed the possible toxicities side effects and complications of this agent including the rare cases of uterine cancer and complications such as deep vein thrombosis.  I have gone ahead and placed the prescription and she will let us know if she has any symptoms of concern.  [name] has a good understanding of the overall plan. She agrees with it. She knows the goal of treatment in her case is cure. She will call with any problems that may develop before her next visit here, in approximately 4 weeks.  Chauncey Cruel, MD   12/10/2018 3:52 PM Medical Oncology and Hematology [facility] [address] [place], [place] [address] Tel. [phone]    Fax. [phone]  This document serves as a record of services personally performed by Lurline Del, MD. It was created on his behalf by Wilburn Mylar, a trained medical scribe. The creation of this record is based on the scribe's personal observations and the provider's statements to them.   I, Lurline Del MD, have reviewed the above documentation for accuracy and completeness, and I agree with the above.

## 2018-12-10 ENCOUNTER — Encounter: Payer: Self-pay | Admitting: Genetic Counselor

## 2018-12-10 ENCOUNTER — Telehealth: Payer: Self-pay | Admitting: Radiation Oncology

## 2018-12-10 ENCOUNTER — Other Ambulatory Visit: Payer: Self-pay

## 2018-12-10 ENCOUNTER — Ambulatory Visit: Payer: Managed Care, Other (non HMO) | Attending: Radiation Oncology | Admitting: Radiation Oncology

## 2018-12-10 ENCOUNTER — Inpatient Hospital Stay: Payer: Managed Care, Other (non HMO)

## 2018-12-10 ENCOUNTER — Ambulatory Visit (HOSPITAL_BASED_OUTPATIENT_CLINIC_OR_DEPARTMENT_OTHER): Payer: Managed Care, Other (non HMO) | Admitting: Genetic Counselor

## 2018-12-10 ENCOUNTER — Encounter: Payer: Self-pay | Admitting: Oncology

## 2018-12-10 ENCOUNTER — Inpatient Hospital Stay: Payer: Managed Care, Other (non HMO) | Attending: Oncology | Admitting: Oncology

## 2018-12-10 VITALS — BP 155/79 | HR 85 | Temp 99.3°F | Resp 20 | Ht 66.0 in | Wt 232.2 lb

## 2018-12-10 DIAGNOSIS — Z7289 Other problems related to lifestyle: Secondary | ICD-10-CM

## 2018-12-10 DIAGNOSIS — Z801 Family history of malignant neoplasm of trachea, bronchus and lung: Secondary | ICD-10-CM

## 2018-12-10 DIAGNOSIS — Z170 Estrogen receptor positive status [ER+]: Secondary | ICD-10-CM | POA: Diagnosis not present

## 2018-12-10 DIAGNOSIS — D0511 Intraductal carcinoma in situ of right breast: Secondary | ICD-10-CM

## 2018-12-10 DIAGNOSIS — Z803 Family history of malignant neoplasm of breast: Secondary | ICD-10-CM

## 2018-12-10 DIAGNOSIS — Z8042 Family history of malignant neoplasm of prostate: Secondary | ICD-10-CM | POA: Diagnosis not present

## 2018-12-10 DIAGNOSIS — Z8052 Family history of malignant neoplasm of bladder: Secondary | ICD-10-CM | POA: Diagnosis not present

## 2018-12-10 DIAGNOSIS — Z800 Family history of malignant neoplasm of digestive organs: Secondary | ICD-10-CM

## 2018-12-10 DIAGNOSIS — F17210 Nicotine dependence, cigarettes, uncomplicated: Secondary | ICD-10-CM

## 2018-12-10 LAB — CMP (CANCER CENTER ONLY)
ALT: 22 U/L (ref 0–44)
AST: 19 U/L (ref 15–41)
Albumin: 4.5 g/dL (ref 3.5–5.0)
Alkaline Phosphatase: 57 U/L (ref 38–126)
Anion gap: 12 (ref 5–15)
BUN: 7 mg/dL (ref 6–20)
CO2: 23 mmol/L (ref 22–32)
Calcium: 9.3 mg/dL (ref 8.9–10.3)
Chloride: 104 mmol/L (ref 98–111)
Creatinine: 0.96 mg/dL (ref 0.44–1.00)
GFR, Est AFR Am: >60 mL/min (ref >60)
GFR, Estimated: >60 mL/min (ref >60)
Glucose, Bld: 153 mg/dL — ABNORMAL HIGH (ref 70–99)
Potassium: 3.4 mmol/L — ABNORMAL LOW (ref 3.5–5.1)
Sodium: 139 mmol/L (ref 135–145)
Total Bilirubin: 0.9 mg/dL (ref 0.3–1.2)
Total Protein: 8.2 g/dL — ABNORMAL HIGH (ref 6.5–8.1)

## 2018-12-10 LAB — CBC WITH DIFFERENTIAL (CANCER CENTER ONLY)
Abs Immature Granulocytes: 0.04 10*3/uL (ref 0.00–0.07)
Basophils Absolute: 0 10*3/uL (ref 0.0–0.1)
Basophils Relative: 0 %
Eosinophils Absolute: 0.1 10*3/uL (ref 0.0–0.5)
Eosinophils Relative: 1 %
HCT: 36.7 % (ref 36.0–46.0)
Hemoglobin: 11.6 g/dL — ABNORMAL LOW (ref 12.0–15.0)
Immature Granulocytes: 1 %
Lymphocytes Relative: 20 %
Lymphs Abs: 1.7 10*3/uL (ref 0.7–4.0)
MCH: 21.1 pg — ABNORMAL LOW (ref 26.0–34.0)
MCHC: 31.6 g/dL (ref 30.0–36.0)
MCV: 66.6 fL — ABNORMAL LOW (ref 80.0–100.0)
Monocytes Absolute: 0.4 10*3/uL (ref 0.1–1.0)
Monocytes Relative: 5 %
Neutro Abs: 6.2 10*3/uL (ref 1.7–7.7)
Neutrophils Relative %: 73 %
Platelet Count: 274 10*3/uL (ref 150–400)
RBC: 5.51 MIL/uL — ABNORMAL HIGH (ref 3.87–5.11)
RDW: 16.4 % — ABNORMAL HIGH (ref 11.5–15.5)
WBC Count: 8.5 10*3/uL (ref 4.0–10.5)
nRBC: 0 % (ref 0.0–0.2)

## 2018-12-10 MED ORDER — TAMOXIFEN CITRATE 20 MG PO TABS: 20.0000 mg | ORAL_TABLET | Freq: Every day | ORAL | 12 refills | Status: AC

## 2018-12-10 NOTE — Telephone Encounter (Signed)
I called to review the recommendations from this morning's conference and to introduce our department since we were not present in [facility] today. I left a message and encouraged her to call me back so we could further discuss. but her case is described below.   In summary, this is 48 y.o.  female with a new diagnosis of right breast cancer. The patient was noted to have calcifications in the right breast  as well as a possible mass in the left breast.  She underwent bilateral diagnostic mammography on 12/01/2018. There was an indeterminate group of calcifications in the upper outer right breast measuring 6 mm, and no persistent abnormality in the left breast.  She underwent stereo guided biopsy on 12/01/2018 as well of the right breast, and final pathology revealed high-grade DCIS with calcifications.  Her tumor was ER/PR positive.    When her case was discussed this morning in conference, she was a candidate to consider starting neoadjuvant antiestrogen therapy while awaiting surgery given the coronavirus, proceeding with genetic testing, and adjuvant radiotherapy.

## 2018-12-10 NOTE — Progress Notes (Signed)
REFERRING PROVIDER: Chauncey Cruel, MD [address] [place], [place] [address]  PRIMARY PROVIDER:  Zara Chess, NP  PRIMARY REASON FOR VISIT:  1. Ductal carcinoma in situ (DCIS) of right breast   2. Family history of breast cancer   3. Family history of prostate cancer      HISTORY OF PRESENT ILLNESS:   Ms. Daloia, a 48 y.o. female, was seen for a [facility] cancer genetics consultation at the request of Dr. Jana Hakim due to a personal and family history of cancer.  Ms. Lamore presents to clinic today to discuss the possibility of a hereditary predisposition to cancer, genetic testing, and to further clarify her future cancer risks, as well as potential cancer risks for family members.   In March 2020, at the age of 48, Ms. Lyvers was diagnosed with DCIS of the breast.       CANCER HISTORY:   No history exists.     RISK FACTORS:  Menarche was at age 51.  First live birth at age 54.  OCP use for approximately 10 years.  Ovaries intact: yes.  Hysterectomy: yes.  Menopausal status: perimenopausal.  HRT use: 0 years. Colonoscopy: no; not examined. Mammogram within the last year: yes. Number of breast biopsies: 1. Up to date with pelvic exams: yes. Any excessive radiation exposure in the past: no  Past Medical History:  Diagnosis Date  . Achalasia    S/p Heller myotomy 1991 and numerous dilations  . Breast cancer ([facility]) 12/01/2018   right, DCIS  . Family history of breast cancer   . Family history of prostate cancer   . GERD (gastroesophageal reflux disease)    Hx - None since 2013 Heller myotomy surgery  . Headache    migraines  . Hypertension   . Pleural effusion 2013   with Heller myotomy surgery    Past Surgical History:  Procedure Laterality Date  . ACHALASIA SURGERY  1991   transthoracic Heller myotomy in [place]  . BILATERAL SALPINGECTOMY Bilateral 12/02/2013   Procedure: BILATERAL SALPINGECTOMY;  Surgeon: Princess Bruins, MD;  Location: [facility];   Service: Gynecology;  Laterality: Bilateral;  . CESAREAN SECTION     x2  . ESOPHAGOGASTRODUODENOSCOPY  10/08/2011   Procedure: ESOPHAGOGASTRODUODENOSCOPY (EGD);  Surgeon: Jeryl Columbia, MD;  Location: [facility];  Service: Endoscopy;  Laterality: N/A;  . ESOPHAGOGASTRODUODENOSCOPY  10/16/2011   Procedure: ESOPHAGOGASTRODUODENOSCOPY (EGD);  Surgeon: Cleotis Nipper, MD;  Location: [facility];  Service: Endoscopy;  Laterality: N/A;  . ESOPHAGOGASTRODUODENOSCOPY  11/06/2011   Procedure: ESOPHAGOGASTRODUODENOSCOPY (EGD);  Surgeon: Lear Ng, MD;  Location: [facility];  Service: Endoscopy;  Laterality: N/A;  . FOREIGN BODY REMOVAL  10/08/2011   Procedure: FOREIGN BODY REMOVAL;  Surgeon: Jeryl Columbia, MD;  Location: [facility] ENDOSCOPY;  Service: Endoscopy;  Laterality: N/A;  botox  needed /ja/magod  . FOREIGN BODY REMOVAL  10/16/2011   Procedure: FOREIGN BODY REMOVAL;  Surgeon: Cleotis Nipper, MD;  Location: [facility]OSCOPY;  Service: Endoscopy;  Laterality: N/A;  . HELLER MYOTOMY  11/07/2011   Procedure: LAPAROSCOPIC HELLER MYOTOMY;  Surgeon: Pedro Earls, MD;  Location: [facility];  Service: General;  Laterality: N/A;  Laparoscopic Redo Heller Myotomy   . ROBOTIC ASSISTED TOTAL HYSTERECTOMY N/A 12/02/2013   Procedure: ROBOTIC ASSISTED TOTAL HYSTERECTOMY;  Surgeon: Princess Bruins, MD;  Location: [facility];  Service: Gynecology;  Laterality: N/A;  . TONSILLECTOMY  1995    Social History   Socioeconomic History  . Marital status: Married  Spouse name: Jeneen Rinks  . Number of children: 2  . Years of education: 28  . Highest education level: Not on file  Occupational History    Employer: [employer]  Social Needs  . Financial resource strain: Not on file  . Food insecurity:    Worry: Never true    Inability: Never true  . Transportation needs:    Medical: No    Non-medical: No  Tobacco Use  . Smoking status: Former Smoker    Packs/day: 0.25    Years: 0.50    Pack years: 0.12     Types: Cigarettes    Last attempt to quit: 10/07/1990    Years since quitting: 28.1  . Smokeless tobacco: Never Used  . Tobacco comment: Smoked for 2 months, nothing heavier  Substance and Sexual Activity  . Alcohol use: Yes    Alcohol/week: 1.0 standard drinks    Types: 1 Glasses of wine per week    Comment: Rarely  . Drug use: No  . Sexual activity: Yes    Birth control/protection: Surgical    Comment: hysterectomy w/ BSO on 12/02/2013  Lifestyle  . Physical activity:    Days per week: Not on file    Minutes per session: Not on file  . Stress: Not on file  Relationships  . Social connections:    Talks on phone: Not on file    Gets together: Not on file    Attends religious service: Not on file    Active member of club or organization: Not on file    Attends meetings of clubs or organizations: Not on file    Relationship status: Not on file  Other Topics Concern  . Not on file  Social History Narrative   Lives at home with husband, working and goes to school, has 2 kids. Very minimal smoking history, very rare alcohol.    Caffeine -rarely     FAMILY HISTORY:  We obtained a detailed, 4-generation family history.  Significant diagnoses are listed below: Family History  Problem Relation Age of Onset  . Hypertension Mother   . Prostate cancer Father 50  . Breast cancer Paternal Grandmother        late 54s?  . Lung cancer Brother 75  . Other Maternal Grandmother        Childbirth  . Anesthesia problems Neg Hx   . Hypotension Neg Hx   . Malignant hyperthermia Neg Hx   . Pseudochol deficiency Neg Hx     The patient has two daughters who are cancer free.  She has two brothers.  One was recently diagnosed with lung and liver cancer.  Both parents are living  The patient's father had prostate cancer at 57.  He has a twin brother who is cancer free.  The paternal grandparents are deceased. The grandmother had breast cancer in her 80's.  The patient's mother is living at  97.  She has a brother and two sisters who are cancer free.  The maternal grandparents are deceased.  The grandmother died in childbirth.  Ms. Weatherbee is unaware of previous family history of genetic testing for hereditary cancer risks. Patient's maternal ancestors are of African American descent, and paternal ancestors are of African American descent. There is no reported Ashkenazi Jewish ancestry. There is no known consanguinity.  GENETIC COUNSELING ASSESSMENT: Ms. Bennetts is a 48 y.o. female with a personal and family history of cancer which is somewhat suggestive of a hereditary cancer syndrome and predisposition  to cancer. We, therefore, discussed and recommended the following at today's visit.   DISCUSSION: We discussed that 5 - 10% of breast cancer is hereditary, with most cases associated with BRCA mutations.  There are other genes that can be associated with hereditary breast cancer syndromes.  Some are associated with changes in surgical management and others are not.    We reviewed the characteristics, features and inheritance patterns of hereditary cancer syndromes. We also discussed genetic testing, including the appropriate family members to test, the process of testing, insurance coverage and turn-around-time for results. We discussed the implications of a negative, positive and/or variant of uncertain significant result. In order to get genetic test results in a timely manner so that Ms. Osterlund can use these genetic test results for surgical decisions, we recommended Ms. Klingbeil pursue genetic testing for the 9-gene STAT panel. Once complete, we recommend Ms. Carton pursue reflex genetic testing to the common hereditary cancer gene panel. The Hereditary Gene Panel offered by [facility] includes sequencing and/or deletion duplication testing of the following 48 genes: APC, ATM, AXIN2, BARD1, BMPR1A, BRCA1, BRCA2, BRIP1, CDH1, CDK4, CDKN2A (p14ARF), CDKN2A (p16INK4a), CHEK2, CTNNA1, DICER1, EPCAM  (Deletion/duplication testing only), GREM1 (promoter region deletion/duplication testing only), KIT, MEN1, MLH1, MSH2, MSH3, MSH6, MUTYH, NBN, NF1, NHTL1, PALB2, PDGFRA, PMS2, POLD1, POLE, PTEN, RAD50, RAD51C, RAD51D, RNF43, SDHB, SDHC, SDHD, SMAD4, SMARCA4. STK11, TP53, TSC1, TSC2, and VHL.  The following genes were evaluated for sequence changes only: SDHA and HOXB13 c.251G>A variant only.   Based on Ms. Aldaz's personal and family history of cancer, she meets medical criteria for genetic testing. Despite that she meets criteria, she may still have an out of pocket cost.   PLAN: After considering the risks, benefits, and limitations, Ms. Aikey provided informed consent to pursue genetic testing and the blood sample was sent to [facility] for analysis of the common hereditary cancer panel. Results should be available within approximately 5-10 days time for the STAT panel and then an additional 1-2 weeks' time, at which point they will be disclosed by telephone to Ms. Kuk, as will any additional recommendations warranted by these results. Ms. Cirrincione will receive a summary of her genetic counseling visit and a copy of her results once available. This information will also be available in Epic.   Lastly, we encouraged Ms. Karren to remain in contact with cancer genetics annually so that we can continuously update the family history and inform her of any changes in cancer genetics and testing that may be of benefit for this family.   Ms. Hasley questions were answered to her satisfaction today. Our contact information was provided should additional questions or concerns arise. Thank you for the referral and allowing [place] to share in the care of your patient.   Mickey Esguerra P. Florene Glen, [place], [facility] Certified Genetic Counselor [email or web address] phone: [phone]  The patient was seen for a total of 30 minutes in face-to-face genetic counseling.  This patient was discussed with Drs. Magrinat, Lindi Adie  and/or Burr Medico who agrees with the above.    _______________________________________________________________________ For Office Staff:  Number of people involved in session: 1 Was an Intern/ student involved with case: no

## 2018-12-11 ENCOUNTER — Telehealth: Payer: Self-pay | Admitting: Radiation Oncology

## 2018-12-11 NOTE — Telephone Encounter (Signed)
I spoke with the patient and she states she started antiestrogen therapy yesterday. She is awaiting information on when she can have surgery. We reviewed the rationale for lumpectomy patients having adjuvant radiotherapy and the benefit it would offer to her. She is in agreement to proceed at the right time and we will coordinate this appointment when she's ready to meet with [place].

## 2018-12-15 ENCOUNTER — Encounter: Payer: Self-pay | Admitting: General Practice

## 2018-12-15 NOTE — Progress Notes (Signed)
[place] Psychosocial Distress Screening [place] by phone following [facility] to introduce [place] team/resources, reviewing distress screen per protocol.  The patient scored a 1 on the Psychosocial Distress Thermometer which indicates mild distress. Also assessed for distress and other psychosocial needs.   [facility] DISTRESS SCREENING 12/15/2018  Screening Type Initial Screening  Distress experienced in past week (1-10) 1  Information Concerns Type Lack of info about diagnosis;Lack of info about treatment;Lack of info about complementary therapy choices  Physical Problem type Sleep/insomnia  Referral to support programs Yes   [name] reports minimal distress now that she knows the scope of dx/tx, resolving the anxiety of the unknown. At this time she states that she is taking a matter-of-fact approach and just plans to follow her treatment plan, looking forward to being on the other side of surgery.  Follow up needed: No. Pt aware of ongoing [facility] team/programming availability, including electronic and telephonic resources available during COVID-19 social distancing. Per pt, no other needs/concerns at this time, but please inbasket or page if needs arise. Thank you.   [place], [place], [facility] Pager [phone] Voicemail [phone]

## 2018-12-16 ENCOUNTER — Telehealth: Payer: Self-pay | Admitting: *Deleted

## 2018-12-16 NOTE — Telephone Encounter (Signed)
  Oncology Nurse Navigator Documentation  Navigator Location: [facility] (12/16/18 1500)   )Navigator Encounter Type: Telephone;[facility] Follow-up (12/16/18 1500) Telephone: Outgoing Call;Clinic/[facility] Follow-up (12/16/18 1500)                 Treatment Initiated Date: 12/10/18(start tamoxifen) (12/16/18 1500)                                Time Spent with Patient: 15 (12/16/18 1500)

## 2018-12-17 ENCOUNTER — Telehealth: Payer: Self-pay

## 2018-12-17 NOTE — Telephone Encounter (Signed)
Nutrition  RD working remotely.  Called following [facility] on 12/10/2018 to introduce self and service at [facility]. Patient reports that she did receive nutrition packet on 12/10/2018 and currently does not have any questions or concerns regarding nutrition at this time.  Patient has RD contact information and encouraged to reach out if needed.  Patient appreciative of call.   Kaylib Furness B. Zenia Resides, [place], [place] Registered Dietitian [phone] (pager)

## 2018-12-22 ENCOUNTER — Telehealth: Payer: Self-pay | Admitting: Genetic Counselor

## 2018-12-22 ENCOUNTER — Encounter: Payer: Self-pay | Admitting: Genetic Counselor

## 2018-12-22 DIAGNOSIS — Z1379 Encounter for other screening for genetic and chromosomal anomalies: Secondary | ICD-10-CM | POA: Insufficient documentation

## 2018-12-22 NOTE — Telephone Encounter (Signed)
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  This explains her diagnosis of breast cancer.  She is in [place], [place] with her family as her brother is dying of cancer.  We will meet via Webex to discuss this result.

## 2018-12-23 ENCOUNTER — Other Ambulatory Visit: Payer: Self-pay | Admitting: *Deleted

## 2018-12-23 ENCOUNTER — Telehealth: Payer: Self-pay | Admitting: *Deleted

## 2018-12-23 DIAGNOSIS — Z1501 Genetic susceptibility to malignant neoplasm of breast: Secondary | ICD-10-CM

## 2018-12-23 DIAGNOSIS — Z1509 Genetic susceptibility to other malignant neoplasm: Secondary | ICD-10-CM

## 2018-12-23 DIAGNOSIS — D0511 Intraductal carcinoma in situ of right breast: Secondary | ICD-10-CM

## 2018-12-23 DIAGNOSIS — Z1502 Genetic susceptibility to malignant neoplasm of ovary: Secondary | ICD-10-CM

## 2018-12-23 NOTE — Telephone Encounter (Signed)
Spoke with patient in regards to needing an MRI due to her BRCA2+.  She is currently in [place] with her brother who is dying from cancer.  She is uncertain when she will return.  Informed her to call me upon her return and I will get MRI scheduled. Patient verbalized understanding.

## 2018-12-24 ENCOUNTER — Inpatient Hospital Stay: Payer: Managed Care, Other (non HMO) | Admitting: Genetic Counselor

## 2018-12-24 DIAGNOSIS — Z1509 Genetic susceptibility to other malignant neoplasm: Secondary | ICD-10-CM

## 2018-12-24 DIAGNOSIS — Z1379 Encounter for other screening for genetic and chromosomal anomalies: Secondary | ICD-10-CM

## 2018-12-24 DIAGNOSIS — Z1501 Genetic susceptibility to malignant neoplasm of breast: Secondary | ICD-10-CM | POA: Insufficient documentation

## 2018-12-24 NOTE — Progress Notes (Addendum)
GENETIC TEST RESULTS   Patient Name: [name] Patient Age: 48 y.o. Encounter Date: 12/24/2018  Referring Provider: Lurline Del, MD   I connected with [name] on 12/24/2018 at 1:00 EDT by Webex video conference and verified that I am speaking with the correct person using two identifiers.   Patient location: [place] Provider location: Office  Ms. Benecke was seen in the [facility] clinic on December 10, 2018 due to a personal and family history of cancer and concern regarding a hereditary predisposition to cancer in the family. Please refer to the prior Genetics clinic note for more information regarding [name]'s medical and family histories and our assessment at the time.   FAMILY HISTORY:  We obtained a detailed, 4-generation family history.  Significant diagnoses are listed below: Family History  Problem Relation Age of Onset  . Hypertension Mother   . Prostate cancer Father 65  . Breast cancer Paternal Grandmother        late 10s?  . Lung cancer Brother 13  . Other Maternal Grandmother        Childbirth  . Anesthesia problems Neg Hx   . Hypotension Neg Hx   . Malignant hyperthermia Neg Hx   . Pseudochol deficiency Neg Hx     The patient has two daughters who are cancer free.  She has two brothers.  One was recently diagnosed with lung and liver cancer.  Both parents are living  The patient's father had prostate cancer at 66.  He has a twin brother who is cancer free.  The paternal grandparents are deceased. The grandmother had breast cancer in her 23's.  The patient's mother is living at 9.  She has a brother and two sisters who are cancer free.  The maternal grandparents are deceased.  The grandmother died in childbirth.  Ms. Chaplin is unaware of previous family history of genetic testing for hereditary cancer risks. Patient's maternal ancestors are of African American descent, and paternal ancestors are of African American descent. There is no reported  Ashkenazi Jewish ancestry. There is no known consanguinity.   GENETIC TESTING:  At the time of [name]'s visit, we recommended she pursue genetic testing of the common hereditary cancer panel. The genetic testing reported on December 22, 2018 through the common hereditary Cancer Panel offered by [facility] identified a single, heterozygous pathogenic gene mutation called BRCA2, c.9253dup. There were no deleterious mutations in APC, ATM, AXIN2, BARD1, BMPR1A, BRCA1, BRCA2, BRIP1, CDH1, CDK4, CDKN2A, CHEK2, CTNNA1, DICER1, EPCAM, GREM1, HOXB13, KIT, MEN1, MLH1, MSH2, MSH3, MSH6, MUTYH, NBN, NF1, NTHL1, PALB2, PDGFRA, PMS2, POLD1, POLE, PTEN, RAD50, RAD51C, RAD51D, RNF43, SDHA, SDHB, SDHC, SDHD, SMAD4, SMARCA4. STK11, TP53, TSC1, TSC2, and VHL.  Genetic testing did identify a variant of uncertain significance (VUS) was identified in the TSC2 gene called c.2996G>A.  At this time, it is unknown if this variant is associated with increased cancer risk or if this is a normal finding, but most variants such as this get reclassified to being inconsequential. It should not be used to make medical management decisions. With time, we suspect the lab will determine the significance of this variant, if any. If we do learn more about it, we will try to contact [name] to discuss it further. However, it is important to stay in touch with [place] periodically and keep the address and phone number up to date.  UPDATE: TSC2 c.2996G>A (p.Ser999Asn) VUS has been reclassified as Benign.  The updated report date is November 16, 2020.  MEDICAL MANAGEMENT: Women who have a BRCA mutation have an increased risk for both breast and ovarian cancer.   As discussed with [name], to reduce the risk for breast cancer, prophylactic bilateral mastectomy is the most effective option for risk reduction. However, for women who choose to keep their breasts intensified screening is equally safe.  She expressed her opinion that she would like to discuss  having a double mastectomy in order to reduce her risk to the greatest extent for a future breast cancer.  Her surgeon is  Dr. Rolm Bookbinder.  To reduce the risk for ovarian cancer, we recommend [name] have a prophylactic bilateral salpingo-oophorectomy. We discussed that screening with CA-125 blood tests and transvaginal ultrasounds can be done twice per year. However, these tests have not been shown to detect ovarian cancer at an early stage. She has already had a partial hysterectomy in 2017 or 2018.  She still has her ovaries and fallopian tubes.  We recommended that she follow up with her GYN office.  [name] will follow up with [name], CNM and her office in regards to her ovarian cancer risk.   RISK REDUCTION: In individuals who have a prophylactic bilateral salpingo-oophorectomy (BSO), the risk for breast cancer is reduced by up to 50%.  It has been reported that short term hormone replacement therapy in women undergoing prophylactic BSO does not negate the reduction of breast cancer risk associated with surgery (1.2018 NCCN guidelines).  FAMILY MEMBERS: It is important that all of [name]'s relatives (both men and women) know of the presence of this gene mutation. Site-specific genetic testing can sort out who in the family is at risk and who is not.   [name]'s children and siblings have a 50% chance to have inherited this mutation. We recommend they have genetic testing for this same mutation, as identifying the presence of this mutation would allow them to also take advantage of risk-reducing measures.  One daughter, however, is relatively young and this will not be of any consequence to them for several years. We do not test children because there is no risk to them until they are adults. We recommend they have genetic counseling and testing by the time they are in their early 20's.   Additionally, individuals with a pathogenic variant in BRCA2 are carriers of Fanconi anemia.  Fanconi anemia is an autosomal recessive disorder that is characterized by bone marrow failure and variable presentation of anomalies, including short stature, abnormal skin pigmentation, abnormal thumbs, malformations of the skeletal and central nervous systems, and developmental delay. Risks for leukemia and early onset solid tumors are significantly elevated. For there to be a risk of Fanconi anemia in offspring, both parents would each have to have a single pathogenic variant in BRCA2; in such a case, the risk of having an affected child is 25%.  SUPPORT AND RESOURCES: If Ms. Bole is interested in BRCA-specific information and support, there are two groups, Facing Our Risk (www.facingourrisk.com) and Bright Pink (www.brightpink.org) which some people have found useful. They provide opportunities to speak with other individuals from high-risk families. To locate genetic counselors in other cities, visit the website of the [employer] (ArtistMovie.se) and Secretary/administrator for a Social worker by zip code.  We encouraged Ms. Basich to remain in contact with [place] on an annual basis so we can update her personal and family histories, and let her know of advances in cancer genetics that may benefit the family.  Our contact number was provided. Ms. Steptoe questions were answered to her satisfaction today, and she knows she is welcome to call anytime with additional questions.   Egidio Lofgren P. Florene Glen, [place], [facility] Certified Genetic Counselor [email or web address] phone: [phone]  The patient was seen for a total of 45 minutes in Webex genetic counseling.

## 2019-01-02 ENCOUNTER — Telehealth: Payer: Self-pay | Admitting: Oncology

## 2019-01-02 NOTE — Telephone Encounter (Signed)
Rescheduled 4/30 appt to 5/22 webex per sch msg. Called and spoke with patient. Confirmed date and time

## 2019-01-06 ENCOUNTER — Encounter: Payer: Self-pay | Admitting: *Deleted

## 2019-01-08 ENCOUNTER — Ambulatory Visit: Payer: Managed Care, Other (non HMO) | Admitting: Oncology

## 2019-01-13 ENCOUNTER — Ambulatory Visit
Admission: RE | Admit: 2019-01-13 | Discharge: 2019-01-13 | Disposition: A | Discharge status: Home / Self Care | Payer: Managed Care, Other (non HMO) | Source: Ambulatory Visit | Attending: General Surgery | Admitting: General Surgery

## 2019-01-13 ENCOUNTER — Other Ambulatory Visit: Payer: Self-pay

## 2019-01-13 DIAGNOSIS — D0511 Intraductal carcinoma in situ of right breast: Secondary | ICD-10-CM

## 2019-01-13 DIAGNOSIS — Z1509 Genetic susceptibility to other malignant neoplasm: Secondary | ICD-10-CM

## 2019-01-13 DIAGNOSIS — Z1501 Genetic susceptibility to malignant neoplasm of breast: Secondary | ICD-10-CM

## 2019-01-13 DIAGNOSIS — Z1502 Genetic susceptibility to malignant neoplasm of ovary: Secondary | ICD-10-CM

## 2019-01-13 MED ORDER — GADOBUTROL 1 MMOL/ML IV SOLN
10.0000 mL | Freq: Once | INTRAVENOUS | Status: AC | PRN
  Administered 2019-01-13: 10 mL via INTRAVENOUS

## 2019-01-14 ENCOUNTER — Other Ambulatory Visit: Payer: Self-pay | Admitting: General Surgery

## 2019-01-14 DIAGNOSIS — D0511 Intraductal carcinoma in situ of right breast: Secondary | ICD-10-CM

## 2019-01-15 ENCOUNTER — Encounter (HOSPITAL_BASED_OUTPATIENT_CLINIC_OR_DEPARTMENT_OTHER)
Admission: RE | Admit: 2019-01-15 | Discharge: 2019-01-15 | Disposition: A | Discharge status: Home / Self Care | Payer: Managed Care, Other (non HMO) | Source: Ambulatory Visit | Attending: General Surgery | Admitting: General Surgery

## 2019-01-15 ENCOUNTER — Other Ambulatory Visit: Payer: Self-pay

## 2019-01-15 ENCOUNTER — Other Ambulatory Visit: Payer: Self-pay | Admitting: General Surgery

## 2019-01-15 ENCOUNTER — Encounter (HOSPITAL_BASED_OUTPATIENT_CLINIC_OR_DEPARTMENT_OTHER): Payer: Self-pay | Admitting: *Deleted

## 2019-01-15 ENCOUNTER — Telehealth: Payer: Self-pay | Admitting: *Deleted

## 2019-01-15 DIAGNOSIS — Z01812 Encounter for preprocedural laboratory examination: Secondary | ICD-10-CM | POA: Diagnosis not present

## 2019-01-15 LAB — BASIC METABOLIC PANEL
Anion gap: 9 (ref 5–15)
BUN: 11 mg/dL (ref 6–20)
CO2: 24 mmol/L (ref 22–32)
Calcium: 9.2 mg/dL (ref 8.9–10.3)
Chloride: 107 mmol/L (ref 98–111)
Creatinine, Ser: 0.87 mg/dL (ref 0.44–1.00)
GFR calc Af Amer: >60 mL/min (ref >60)
GFR calc non Af Amer: >60 mL/min (ref >60)
Glucose, Bld: 104 mg/dL — ABNORMAL HIGH (ref 70–99)
Potassium: 4.1 mmol/L (ref 3.5–5.1)
Sodium: 140 mmol/L (ref 135–145)

## 2019-01-15 MED ORDER — ENSURE PRE-SURGERY PO LIQD: 296.0000 mL | Freq: Once | ORAL | Status: DC

## 2019-01-15 NOTE — H&P (Signed)
Subjective:     Patient ID: [name] is a 48 y.o. female.  HPI  Patient of Drs. Donne Hazel and Magrinat here for consultation breast reconstruction. Presented with palpable right breast mass. Screening MMG showed right breast calcifications and possible left breast mass. Diagnostic MMG showed 6 mm group of calcifications within the right UOQ . 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  MRI demonstrated 9 mm mass within the upper-outer right breast anterior depth which may represent intramammary LN. Adjacent 5 mm mass which may represent intramammary LN . Possibility of malignancy at this location is not excluded. Second look [place] suggested-as plan for bilateral mastectomies this has not been scheduled.  Tamoxifen held in anticipation surgery.  Genetics with BRCA2 mutation, VUS TSC2  Plan bilateral mastectomies. Current 36 DD. Desires no larger. Wt up 20 lb over last year.  Works as Cabin crew. Husband is a Administrator, states home every night. Two kids, daughter is Presenter, broadcasting at [employer].  Review of Systems  Allergic/Immunologic: Positive for food allergies.   Remainder 12 point review negative    Objective:   Physical Exam  Cardiovascular: Normal rate, regular rhythm and normal heart sounds.  Pulmonary/Chest: Effort normal and breath sounds normal.   Multiple areas scarring upper chest and arms- states she has nervous habit scratching or picking No palpable masses, grade 3 ptosis bilat SN to nipple R 31.5 L 31 cm BW R 20 L 20 cm (CW 14 cm) Nipple to IMF R 12 L 12 cm    Assessment:     DCIS right breast BRCA2    Plan:     Plan bilateral SRM with immediate expander acellular dermis reconstruction.  Rx for [facility] [place] given.  Reviewed anchor type scar, drains, post procedure limitations, visits. Discussed process of expansion and implant based risks including rupture, MRI surveillance for silicone implants, infection requiring surgery or removal, contracture.    Discussed use of acellular dermis in reconstruction, cadaveric source, incorporation over several weeks, risk that if has seroma or infection can act as additional nidus for infection if not incorporated.  Discussed prepectoral vs sub pectoral reconstruction. Discussed with patient and benefit of this is no animation deformity, may be less pain. Risk may be more visible rippling over upper poles, greater need of ADM. Reviewed pre pectoral would require larger amount acellular dermis, more drains. Discussed any type reconstruction also risks long term displacement implant and visible rippling. If prepectoral counseled I would recommend she be comfortable with silicone implants as more options that have less rippling. She agrees to prepectoral placement.  Reviewed reconstruction will be asensate and not stimulate. Reviewed additional risks including but not limited to risks mastectomy flap necrosis requiring additional surgery, seroma, hematoma, asymmetry, need to additional procedures, fat necrosis, DVT/PE, damage to adjacent structures, cardiopulmonary complications.  Discussed risk COVID infectionthrough this elective surgery. It is likely she will receive COVID testing prior to surgery. Discussed even if shereceivesa negative test result, the tests in some cases may fail to detect the virus or she maycontract COVID after the test.COVID 19 infectionbefore/during/aftersurgery may result in lead to a higher chance of complication and death.  Rx for oxycodone, bactrim, and Robaxin given   Irene Limbo, MD [facility]astic & Reconstruct[facility] Surgery [phone], pin [phone]

## 2019-01-15 NOTE — Progress Notes (Signed)
Ensure pre surgery drink given with instructions to complete by 0400 dos, surgical soap given with instructions, pt verbalized understanding.

## 2019-01-15 NOTE — Telephone Encounter (Signed)
Pt called regarding appt with Dr. Jana Hakim after sx. Discussed at that time Dr. Jana Hakim will discuss final pathology and next steps in tx plan. R/s post op to 5/29 at 10:30. Confirmed appt date and time.

## 2019-01-20 ENCOUNTER — Encounter: Payer: Self-pay | Admitting: Oncology

## 2019-01-20 NOTE — Progress Notes (Signed)
Returned patient's call from voicemail regarding grants.  Introduced myself as Arboriculturist and to address concern. Patient states she is having surgery on Friday. Asked patient if she was going to be doing chemo or radiation and she said she doesn't think so. Advised patient she would have to be in active treatment to apply for the in-house grant that helps with personal expenses. Advised if she has to do further treatment after surgery, she may give me a call to discuss what is available at that time. She verbalized understanding. Also gave her the number to [employer] [place] whom may have assistance available due to her surgery. She thanked me for the information.

## 2019-01-21 ENCOUNTER — Other Ambulatory Visit (HOSPITAL_COMMUNITY)
Admission: RE | Admit: 2019-01-21 | Discharge: 2019-01-21 | Disposition: A | Discharge status: Home / Self Care | Payer: Managed Care, Other (non HMO) | Source: Ambulatory Visit | Attending: General Surgery | Admitting: General Surgery

## 2019-01-21 DIAGNOSIS — Z1159 Encounter for screening for other viral diseases: Secondary | ICD-10-CM | POA: Diagnosis present

## 2019-01-21 LAB — SARS CORONAVIRUS 2 BY RT PCR (HOSPITAL ORDER, PERFORMED IN ~~LOC~~ HOSPITAL LAB): SARS Coronavirus 2: NEGATIVE

## 2019-01-23 ENCOUNTER — Ambulatory Visit (HOSPITAL_BASED_OUTPATIENT_CLINIC_OR_DEPARTMENT_OTHER): Payer: Managed Care, Other (non HMO) | Admitting: Anesthesiology

## 2019-01-23 ENCOUNTER — Encounter (HOSPITAL_BASED_OUTPATIENT_CLINIC_OR_DEPARTMENT_OTHER): Payer: Self-pay

## 2019-01-23 ENCOUNTER — Encounter (HOSPITAL_BASED_OUTPATIENT_CLINIC_OR_DEPARTMENT_OTHER)
Admission: RE | Disposition: A | Discharge status: Home / Self Care | Payer: Self-pay | Source: Home / Self Care | Attending: General Surgery

## 2019-01-23 ENCOUNTER — Ambulatory Visit (HOSPITAL_BASED_OUTPATIENT_CLINIC_OR_DEPARTMENT_OTHER)
Admission: RE | Admit: 2019-01-23 | Discharge: 2019-01-24 | Disposition: A | Discharge status: Home / Self Care | Payer: Managed Care, Other (non HMO) | Attending: General Surgery | Admitting: General Surgery

## 2019-01-23 ENCOUNTER — Other Ambulatory Visit: Payer: Self-pay

## 2019-01-23 ENCOUNTER — Ambulatory Visit (HOSPITAL_COMMUNITY)
Admission: RE | Admit: 2019-01-23 | Discharge: 2019-01-23 | Disposition: A | Discharge status: Home / Self Care | Payer: Managed Care, Other (non HMO) | Source: Ambulatory Visit | Attending: General Surgery | Admitting: General Surgery

## 2019-01-23 DIAGNOSIS — Z79810 Long term (current) use of selective estrogen receptor modulators (SERMs): Secondary | ICD-10-CM | POA: Diagnosis not present

## 2019-01-23 DIAGNOSIS — Z803 Family history of malignant neoplasm of breast: Secondary | ICD-10-CM | POA: Diagnosis not present

## 2019-01-23 DIAGNOSIS — I10 Essential (primary) hypertension: Secondary | ICD-10-CM | POA: Diagnosis not present

## 2019-01-23 DIAGNOSIS — N6489 Other specified disorders of breast: Secondary | ICD-10-CM | POA: Insufficient documentation

## 2019-01-23 DIAGNOSIS — Z1501 Genetic susceptibility to malignant neoplasm of breast: Secondary | ICD-10-CM | POA: Insufficient documentation

## 2019-01-23 DIAGNOSIS — D0511 Intraductal carcinoma in situ of right breast: Secondary | ICD-10-CM

## 2019-01-23 DIAGNOSIS — Z87891 Personal history of nicotine dependence: Secondary | ICD-10-CM | POA: Diagnosis not present

## 2019-01-23 DIAGNOSIS — Z170 Estrogen receptor positive status [ER+]: Secondary | ICD-10-CM | POA: Diagnosis not present

## 2019-01-23 DIAGNOSIS — Z79899 Other long term (current) drug therapy: Secondary | ICD-10-CM | POA: Diagnosis not present

## 2019-01-23 DIAGNOSIS — D242 Benign neoplasm of left breast: Secondary | ICD-10-CM | POA: Insufficient documentation

## 2019-01-23 HISTORY — PX: BREAST RECONSTRUCTION WITH PLACEMENT OF TISSUE EXPANDER AND FLEX HD (ACELLULAR HYDRATED DERMIS): SHX6295

## 2019-01-23 HISTORY — PX: BREAST SURGERY: SHX581

## 2019-01-23 HISTORY — PX: MASTECTOMY W/ SENTINEL NODE BIOPSY: SHX2001

## 2019-01-23 SURGERY — MASTECTOMY WITH SENTINEL LYMPH NODE BIOPSY
Anesthesia: General | Site: Breast | Laterality: Bilateral

## 2019-01-23 MED ORDER — ROCURONIUM BROMIDE 50 MG/5ML IV SOSY
PREFILLED_SYRINGE | INTRAVENOUS | Status: DC | PRN
  Administered 2019-01-23: 20 mg via INTRAVENOUS
  Administered 2019-01-23: 60 mg via INTRAVENOUS

## 2019-01-23 MED ORDER — KETOROLAC TROMETHAMINE 15 MG/ML IJ SOLN: 15.0000 mg | Freq: Four times a day (QID) | INTRAMUSCULAR | Status: DC | PRN

## 2019-01-23 MED ORDER — PROPOFOL 500 MG/50ML IV EMUL
INTRAVENOUS | Status: AC
  Filled 2019-01-23: qty 50

## 2019-01-23 MED ORDER — FENTANYL CITRATE (PF) 100 MCG/2ML IJ SOLN
25.0000 ug | INTRAMUSCULAR | Status: DC | PRN
  Administered 2019-01-23: 13:00:00 25 ug via INTRAVENOUS

## 2019-01-23 MED ORDER — FENTANYL CITRATE (PF) 100 MCG/2ML IJ SOLN
INTRAMUSCULAR | Status: AC
  Filled 2019-01-23: qty 2

## 2019-01-23 MED ORDER — CEFAZOLIN SODIUM-DEXTROSE 2-4 GM/100ML-% IV SOLN
INTRAVENOUS | Status: AC
  Filled 2019-01-23: qty 100

## 2019-01-23 MED ORDER — CEFAZOLIN SODIUM-DEXTROSE 2-4 GM/100ML-% IV SOLN
2.0000 g | INTRAVENOUS | Status: AC
  Administered 2019-01-23 (×2): 2 g via INTRAVENOUS

## 2019-01-23 MED ORDER — PHENYLEPHRINE 40 MCG/ML (10ML) SYRINGE FOR IV PUSH (FOR BLOOD PRESSURE SUPPORT)
PREFILLED_SYRINGE | INTRAVENOUS | Status: DC | PRN
  Administered 2019-01-23 (×2): 80 ug via INTRAVENOUS
  Administered 2019-01-23: 40 ug via INTRAVENOUS

## 2019-01-23 MED ORDER — PROPOFOL 10 MG/ML IV BOLUS
INTRAVENOUS | Status: DC | PRN
  Administered 2019-01-23: 200 mg via INTRAVENOUS

## 2019-01-23 MED ORDER — OXYCODONE HCL 5 MG PO TABS
5.0000 mg | ORAL_TABLET | ORAL | Status: DC | PRN
  Administered 2019-01-23 – 2019-01-24 (×4): 5 mg via ORAL
  Filled 2019-01-23 (×4): qty 1

## 2019-01-23 MED ORDER — KETOROLAC TROMETHAMINE 15 MG/ML IJ SOLN
INTRAMUSCULAR | Status: AC
  Filled 2019-01-23: qty 1

## 2019-01-23 MED ORDER — KETOROLAC TROMETHAMINE 15 MG/ML IJ SOLN
15.0000 mg | INTRAMUSCULAR | Status: DC
  Administered 2019-01-23: 07:00:00 15 mg via INTRAVENOUS

## 2019-01-23 MED ORDER — ROCURONIUM BROMIDE 10 MG/ML (PF) SYRINGE
PREFILLED_SYRINGE | INTRAVENOUS | Status: AC
  Filled 2019-01-23: qty 10

## 2019-01-23 MED ORDER — LIDOCAINE 2% (20 MG/ML) 5 ML SYRINGE
INTRAMUSCULAR | Status: DC | PRN
  Administered 2019-01-23: 100 mg via INTRAVENOUS

## 2019-01-23 MED ORDER — PHENYLEPHRINE 40 MCG/ML (10ML) SYRINGE FOR IV PUSH (FOR BLOOD PRESSURE SUPPORT)
PREFILLED_SYRINGE | INTRAVENOUS | Status: AC
  Filled 2019-01-23: qty 10

## 2019-01-23 MED ORDER — SODIUM CHLORIDE 0.9 % IV SOLN
INTRAVENOUS | Status: DC | PRN
  Administered 2019-01-23: 1000 mL

## 2019-01-23 MED ORDER — ACETAMINOPHEN 500 MG PO TABS
1000.0000 mg | ORAL_TABLET | Freq: Four times a day (QID) | ORAL | Status: DC
  Administered 2019-01-23 – 2019-01-24 (×3): 1000 mg via ORAL
  Filled 2019-01-23 (×3): qty 2

## 2019-01-23 MED ORDER — MORPHINE SULFATE (PF) 4 MG/ML IV SOLN: 2.0000 mg | INTRAVENOUS | Status: DC | PRN

## 2019-01-23 MED ORDER — ONDANSETRON HCL 4 MG/2ML IJ SOLN
INTRAMUSCULAR | Status: DC | PRN
  Administered 2019-01-23: 4 mg via INTRAVENOUS

## 2019-01-23 MED ORDER — LACTATED RINGERS IV SOLN
INTRAVENOUS | Status: DC
  Administered 2019-01-23 (×3): via INTRAVENOUS

## 2019-01-23 MED ORDER — POVIDONE-IODINE 10 % EX SOLN
CUTANEOUS | Status: DC | PRN
  Administered 2019-01-23: 1 via TOPICAL

## 2019-01-23 MED ORDER — METHOCARBAMOL 500 MG PO TABS
500.0000 mg | ORAL_TABLET | Freq: Three times a day (TID) | ORAL | Status: DC
  Administered 2019-01-23 (×2): 500 mg via ORAL
  Filled 2019-01-23 (×2): qty 1

## 2019-01-23 MED ORDER — PROMETHAZINE HCL 25 MG/ML IJ SOLN: 6.2500 mg | INTRAMUSCULAR | Status: DC | PRN

## 2019-01-23 MED ORDER — DEXAMETHASONE SODIUM PHOSPHATE 10 MG/ML IJ SOLN
INTRAMUSCULAR | Status: AC
  Filled 2019-01-23: qty 1

## 2019-01-23 MED ORDER — SCOPOLAMINE 1 MG/3DAYS TD PT72: 1.0000 | MEDICATED_PATCH | Freq: Once | TRANSDERMAL | Status: DC | PRN

## 2019-01-23 MED ORDER — EPHEDRINE SULFATE-NACL 50-0.9 MG/10ML-% IV SOSY
PREFILLED_SYRINGE | INTRAVENOUS | Status: DC | PRN
  Administered 2019-01-23 (×2): 5 mg via INTRAVENOUS

## 2019-01-23 MED ORDER — BUPIVACAINE HCL (PF) 0.25 % IJ SOLN
INTRAMUSCULAR | Status: DC | PRN
  Administered 2019-01-23 (×2): 30 mL

## 2019-01-23 MED ORDER — LISINOPRIL-HYDROCHLOROTHIAZIDE 10-12.5 MG PO TABS: 1.0000 | ORAL_TABLET | Freq: Every day | ORAL | Status: DC

## 2019-01-23 MED ORDER — DEXAMETHASONE SODIUM PHOSPHATE 4 MG/ML IJ SOLN
INTRAMUSCULAR | Status: DC | PRN
  Administered 2019-01-23: 10 mg via INTRAVENOUS

## 2019-01-23 MED ORDER — ACETAMINOPHEN 500 MG PO TABS
ORAL_TABLET | ORAL | Status: AC
  Filled 2019-01-23: qty 2

## 2019-01-23 MED ORDER — LIDOCAINE 2% (20 MG/ML) 5 ML SYRINGE
INTRAMUSCULAR | Status: AC
  Filled 2019-01-23: qty 5

## 2019-01-23 MED ORDER — MIDAZOLAM HCL 2 MG/2ML IJ SOLN
INTRAMUSCULAR | Status: AC
  Filled 2019-01-23: qty 2

## 2019-01-23 MED ORDER — GABAPENTIN 100 MG PO CAPS
100.0000 mg | ORAL_CAPSULE | ORAL | Status: AC
  Administered 2019-01-23: 100 mg via ORAL

## 2019-01-23 MED ORDER — ONDANSETRON HCL 4 MG/2ML IJ SOLN
INTRAMUSCULAR | Status: AC
  Filled 2019-01-23: qty 2

## 2019-01-23 MED ORDER — ACETAMINOPHEN 500 MG PO TABS
1000.0000 mg | ORAL_TABLET | ORAL | Status: AC
  Administered 2019-01-23: 07:00:00 1000 mg via ORAL

## 2019-01-23 MED ORDER — CEFAZOLIN SODIUM 1 G IJ SOLR
INTRAMUSCULAR | Status: AC
  Filled 2019-01-23: qty 20

## 2019-01-23 MED ORDER — EPHEDRINE 5 MG/ML INJ
INTRAVENOUS | Status: AC
  Filled 2019-01-23: qty 10

## 2019-01-23 MED ORDER — ONDANSETRON 4 MG PO TBDP: 4.0000 mg | ORAL_TABLET | Freq: Four times a day (QID) | ORAL | Status: DC | PRN

## 2019-01-23 MED ORDER — MIDAZOLAM HCL 2 MG/2ML IJ SOLN
1.0000 mg | INTRAMUSCULAR | Status: DC | PRN
  Administered 2019-01-23 (×2): 2 mg via INTRAVENOUS

## 2019-01-23 MED ORDER — SODIUM CHLORIDE (PF) 0.9 % IJ SOLN
INTRAMUSCULAR | Status: DC | PRN
  Administered 2019-01-23: 50 mL

## 2019-01-23 MED ORDER — ONDANSETRON HCL 4 MG/2ML IJ SOLN: 4.0000 mg | Freq: Four times a day (QID) | INTRAMUSCULAR | Status: DC | PRN

## 2019-01-23 MED ORDER — TECHNETIUM TC 99M SULFUR COLLOID FILTERED
1.0000 | Freq: Once | INTRAVENOUS | Status: AC | PRN
  Administered 2019-01-23: 1 via INTRADERMAL

## 2019-01-23 MED ORDER — FENTANYL CITRATE (PF) 100 MCG/2ML IJ SOLN
50.0000 ug | INTRAMUSCULAR | Status: AC | PRN
  Administered 2019-01-23: 50 ug via INTRAVENOUS
  Administered 2019-01-23: 100 ug via INTRAVENOUS
  Administered 2019-01-23 (×6): 50 ug via INTRAVENOUS

## 2019-01-23 MED ORDER — GABAPENTIN 100 MG PO CAPS
ORAL_CAPSULE | ORAL | Status: AC
  Filled 2019-01-23: qty 1

## 2019-01-23 MED ORDER — SODIUM CHLORIDE 0.9 % IV SOLN
INTRAVENOUS | Status: DC
  Administered 2019-01-23: 13:00:00 via INTRAVENOUS

## 2019-01-23 SURGICAL SUPPLY — 102 items
ADH SKN CLS APL DERMABOND .7 (GAUZE/BANDAGES/DRESSINGS) ×4
ALLODERM 16X20 PERFORATED (Tissue) ×2 IMPLANT
ALLOGRAFT PERF 16X20 1.6+/-0.4 (Tissue) ×2 IMPLANT
APL PRP STRL LF DISP 70% ISPRP (MISCELLANEOUS) ×2
APL SKNCLS STERI-STRIP NONHPOA (GAUZE/BANDAGES/DRESSINGS)
APPLIER CLIP 11 MED OPEN (CLIP)
APPLIER CLIP 9.375 MED OPEN (MISCELLANEOUS) ×3
APR CLP MED 11 20 MLT OPN (CLIP)
APR CLP MED 9.3 20 MLT OPN (MISCELLANEOUS) ×1
BAG DECANTER FOR FLEXI CONT (MISCELLANEOUS) ×3 IMPLANT
BENZOIN TINCTURE PRP APPL 2/3 (GAUZE/BANDAGES/DRESSINGS) IMPLANT
BINDER BREAST XLRG (GAUZE/BANDAGES/DRESSINGS) IMPLANT
BINDER BREAST XXLRG (GAUZE/BANDAGES/DRESSINGS) IMPLANT
BIOPATCH RED 1 DISK 7.0 (GAUZE/BANDAGES/DRESSINGS) IMPLANT
BIOPATCH RED 1IN DISK 7.0MM (GAUZE/BANDAGES/DRESSINGS)
BLADE CLIPPER SURG (BLADE) IMPLANT
BLADE HEX COATED 2.75 (ELECTRODE) IMPLANT
BLADE SURG 10 STRL SS (BLADE) ×7 IMPLANT
BLADE SURG 15 STRL LF DISP TIS (BLADE) ×1 IMPLANT
BLADE SURG 15 STRL SS (BLADE) ×3
BNDG GAUZE ELAST 4 BULKY (GAUZE/BANDAGES/DRESSINGS) IMPLANT
CANISTER SUCT 1200ML W/VALVE (MISCELLANEOUS) ×5 IMPLANT
CHLORAPREP W/TINT 26 (MISCELLANEOUS) ×6 IMPLANT
CLIP APPLIE 11 MED OPEN (CLIP) IMPLANT
CLIP APPLIE 9.375 MED OPEN (MISCELLANEOUS) IMPLANT
CLIP VESOCCLUDE SM WIDE 6/CT (CLIP) IMPLANT
CLOSURE WOUND 1/2 X4 (GAUZE/BANDAGES/DRESSINGS)
COUNTER NEEDLE 1200 MAGNETIC (NEEDLE) ×2 IMPLANT
COVER BACK TABLE REUSABLE LG (DRAPES) ×3 IMPLANT
COVER MAYO STAND REUSABLE (DRAPES) ×6 IMPLANT
COVER PROBE W GEL 5X96 (DRAPES) ×3 IMPLANT
COVER WAND RF STERILE (DRAPES) IMPLANT
DECANTER SPIKE VIAL GLASS SM (MISCELLANEOUS) IMPLANT
DERMABOND ADVANCED (GAUZE/BANDAGES/DRESSINGS) ×8
DERMABOND ADVANCED .7 DNX12 (GAUZE/BANDAGES/DRESSINGS) ×1 IMPLANT
DRAIN CHANNEL 15F RND FF W/TCR (WOUND CARE) ×4 IMPLANT
DRAIN CHANNEL 19F RND (DRAIN) ×6 IMPLANT
DRAPE TOP ARMCOVERS (MISCELLANEOUS) ×3 IMPLANT
DRAPE U-SHAPE 76X120 STRL (DRAPES) ×3 IMPLANT
DRAPE UTILITY XL STRL (DRAPES) ×5 IMPLANT
DRSG PAD ABDOMINAL 8X10 ST (GAUZE/BANDAGES/DRESSINGS) ×8 IMPLANT
DRSG TEGADERM 4X10 (GAUZE/BANDAGES/DRESSINGS) ×8 IMPLANT
DRSG TEGADERM 4X4.75 (GAUZE/BANDAGES/DRESSINGS) IMPLANT
ELECT BLADE 4.0 EZ CLEAN MEGAD (MISCELLANEOUS) ×3
ELECT COATED BLADE 2.86 ST (ELECTRODE) ×3 IMPLANT
ELECT REM PT RETURN 9FT ADLT (ELECTROSURGICAL) ×3
ELECTRODE BLDE 4.0 EZ CLN MEGD (MISCELLANEOUS) ×1 IMPLANT
ELECTRODE REM PT RTRN 9FT ADLT (ELECTROSURGICAL) ×1 IMPLANT
EVACUATOR SILICONE 100CC (DRAIN) ×10 IMPLANT
EXPANDER TISSUE FV FOURTE 500 (Prosthesis & Implant Plastic) IMPLANT
GAUZE SPONGE 4X4 12PLY STRL LF (GAUZE/BANDAGES/DRESSINGS) IMPLANT
GLOVE BIO SURGEON STRL SZ 6 (GLOVE) ×10 IMPLANT
GLOVE BIO SURGEON STRL SZ7 (GLOVE) ×3 IMPLANT
GLOVE BIOGEL PI IND STRL 7.5 (GLOVE) ×1 IMPLANT
GLOVE BIOGEL PI INDICATOR 7.5 (GLOVE) ×2
GOWN STRL REUS W/ TWL LRG LVL3 (GOWN DISPOSABLE) ×4 IMPLANT
GOWN STRL REUS W/TWL LRG LVL3 (GOWN DISPOSABLE) ×12
ILLUMINATOR WAVEGUIDE N/F (MISCELLANEOUS) IMPLANT
KIT FILL SYSTEM UNIVERSAL (SET/KITS/TRAYS/PACK) IMPLANT
LIGHT WAVEGUIDE WIDE FLAT (MISCELLANEOUS) ×3 IMPLANT
MARKER SKIN DUAL TIP RULER LAB (MISCELLANEOUS) IMPLANT
NDL HYPO 25X1 1.5 SAFETY (NEEDLE) IMPLANT
NDL SAFETY ECLIPSE 18X1.5 (NEEDLE) IMPLANT
NEEDLE HYPO 18GX1.5 SHARP (NEEDLE)
NEEDLE HYPO 25X1 1.5 SAFETY (NEEDLE) IMPLANT
NS IRRIG 1000ML POUR BTL (IV SOLUTION) ×3 IMPLANT
PACK BASIN DAY SURGERY FS (CUSTOM PROCEDURE TRAY) ×3 IMPLANT
PENCIL BUTTON HOLSTER BLD 10FT (ELECTRODE) ×3 IMPLANT
PIN SAFETY STERILE (MISCELLANEOUS) ×3 IMPLANT
PUNCH BIOPSY DERMAL 4MM (MISCELLANEOUS) IMPLANT
SHEET MEDIUM DRAPE 40X70 STRL (DRAPES) ×5 IMPLANT
SLEEVE SCD COMPRESS KNEE MED (MISCELLANEOUS) ×3 IMPLANT
SPONGE LAP 18X18 RF (DISPOSABLE) ×8 IMPLANT
SPONGE LAP 4X18 RFD (DISPOSABLE) IMPLANT
STAPLER VISISTAT 35W (STAPLE) ×3 IMPLANT
STRIP CLOSURE SKIN 1/2X4 (GAUZE/BANDAGES/DRESSINGS) IMPLANT
SUT CHROMIC 4 0 PS 2 18 (SUTURE) ×12 IMPLANT
SUT ETHILON 2 0 FS 18 (SUTURE) ×5 IMPLANT
SUT MNCRL AB 4-0 PS2 18 (SUTURE) ×9 IMPLANT
SUT SILK 2 0 SH (SUTURE) ×2 IMPLANT
SUT VIC AB 2-0 SH 27 (SUTURE) ×6
SUT VIC AB 2-0 SH 27XBRD (SUTURE) IMPLANT
SUT VIC AB 3-0 54X BRD REEL (SUTURE) IMPLANT
SUT VIC AB 3-0 BRD 54 (SUTURE)
SUT VIC AB 3-0 SH 27 (SUTURE) ×12
SUT VIC AB 3-0 SH 27X BRD (SUTURE) IMPLANT
SUT VICRYL 0 CT-2 (SUTURE) ×8 IMPLANT
SUT VICRYL 3-0 CR8 SH (SUTURE) ×3 IMPLANT
SUT VICRYL 4-0 PS2 18IN ABS (SUTURE) ×4 IMPLANT
SUT VLOC 180 0 24IN GS25 (SUTURE) ×4 IMPLANT
SYR 50ML LL SCALE MARK (SYRINGE) ×3 IMPLANT
SYR BULB IRRIGATION 50ML (SYRINGE) ×5 IMPLANT
SYR CONTROL 10ML LL (SYRINGE) IMPLANT
TAPE MEASURE VINYL STERILE (MISCELLANEOUS) IMPLANT
TISSUE EXPNDR FV FOURTE 500 (Prosthesis & Implant Plastic) ×6 IMPLANT
TOWEL GREEN STERILE FF (TOWEL DISPOSABLE) ×6 IMPLANT
TRAY DSU PREP LF (CUSTOM PROCEDURE TRAY) ×3 IMPLANT
TRAY FOLEY W/BAG SLVR 14FR LF (SET/KITS/TRAYS/PACK) ×2 IMPLANT
TUBE CONNECTING 20'X1/4 (TUBING) ×2
TUBE CONNECTING 20X1/4 (TUBING) ×3 IMPLANT
UNDERPAD 30X30 (UNDERPADS AND DIAPERS) ×6 IMPLANT
YANKAUER SUCT BULB TIP NO VENT (SUCTIONS) ×3 IMPLANT

## 2019-01-23 NOTE — Anesthesia Preprocedure Evaluation (Addendum)
Anesthesia Evaluation  Patient identified by MRN, date of birth, ID band Patient awake    Reviewed: Allergy & Precautions, NPO status , Patient's Chart, lab work & pertinent test results  Airway Mallampati: II  TM Distance: >3 FB Neck ROM: Full    Dental  (+) Teeth Intact, Dental Advisory Given   Pulmonary former smoker,    Pulmonary exam normal breath sounds clear to auscultation       Cardiovascular hypertension, Pt. on medications Normal cardiovascular exam Rhythm:Regular Rate:Normal     Neuro/Psych  Headaches, negative psych ROS   GI/Hepatic Neg liver ROS, GERD  ,Achalasia   Endo/Other  negative endocrine ROS  Renal/GU negative Renal ROS     Musculoskeletal negative musculoskeletal ROS (+)   Abdominal   Peds  Hematology negative hematology ROS (+)   Anesthesia Other Findings Day of surgery medications reviewed with the patient.  Right breast cancer  Reproductive/Obstetrics negative OB ROS                            Anesthesia Physical Anesthesia Plan  ASA: II  Anesthesia Plan: General   Post-op Pain Management:  Regional for Post-op pain   Induction: Intravenous  PONV Risk Score and Plan: 3 and Midazolam, Ondansetron and Dexamethasone  Airway Management Planned: Oral ETT  Additional Equipment:   Intra-op Plan:   Post-operative Plan: Extubation in OR  Informed Consent: I have reviewed the patients History and Physical, chart, labs and discussed the procedure including the risks, benefits and alternatives for the proposed anesthesia with the patient or authorized representative who has indicated his/her understanding and acceptance.     Dental advisory given  Plan Discussed with: CRNA  Anesthesia Plan Comments:        Anesthesia Quick Evaluation

## 2019-01-23 NOTE — Transfer of Care (Signed)
Immediate Anesthesia Transfer of Care Note  Patient: [name]  Procedure(s) Performed: RIGHT MASTECTOMY WITH RIGHT AXILLARY SENTINEL LYMPH NODE BIOPSY AND LEFT RISK REDUCING MASTECTOMY (Bilateral Breast) BREAST RECONSTRUCTION WITH PLACEMENT OF TISSUE EXPANDER AND FLEX HD (ACELLULAR HYDRATED DERMIS) (Bilateral Breast)  Patient Location: PACU  Anesthesia Type:General  Level of Consciousness: sedated  Airway & Oxygen Therapy: Patient Spontanous Breathing and Patient connected to face mask oxygen  Post-op Assessment: Report given to RN and Post -op Vital signs reviewed and stable  Post vital signs: Reviewed and stable  Last Vitals:  Vitals Value Taken Time  BP    Temp    Pulse 99 01/23/2019 12:12 PM  Resp 0 01/23/2019 12:12 PM  SpO2 97 % 01/23/2019 12:12 PM  Vitals shown include unvalidated device data.  Last Pain:  Vitals:   01/23/19 0633  TempSrc: Oral  PainSc: 0-No pain      Patients Stated Pain Goal: 7 (80/88/11 0315)  Complications: No apparent anesthesia complications

## 2019-01-23 NOTE — Anesthesia Procedure Notes (Signed)
Anesthesia Regional Block: Pectoralis block   Pre-Anesthetic Checklist: ,, timeout performed, Correct Patient, Correct Site, Correct Laterality, Correct Procedure, Correct Position, site marked, Risks and benefits discussed,  Surgical consent,  Pre-op evaluation,  At surgeon's request and post-op pain management  Laterality: Right  Prep: chloraprep       Needles:  Injection technique: Single-shot  Needle Type: Echogenic Needle     Needle Length: 9cm  Needle Gauge: 21     Additional Needles:   Procedures:,,,, ultrasound used (permanent image in chart),,,,  Narrative:  Start time: 01/23/2019 7:09 AM End time: 01/23/2019 7:14 AM Injection made incrementally with aspirations every 5 mL.  Performed by: Personally  Anesthesiologist: Catalina Gravel, MD  Additional Notes: No pain on injection. No increased resistance to injection. Injection made in 5cc increments.  Good needle visualization.  Patient tolerated procedure well.

## 2019-01-23 NOTE — Op Note (Signed)
Operative Note   DATE OF OPERATION: 5.15.20  LOCATION: [facility]-observation  SURGICAL DIVISION: Plastic Surgery  PREOPERATIVE DIAGNOSES:  1. Right breast DCIS 2. BRCA2 mutation  POSTOPERATIVE DIAGNOSES:  same  PROCEDURE:  1. Bilateral breast reconstruction with tissue expander 2. Acellular dermis (Alloderm) for breast reconstruction 600 cm2  SURGEON: Irene Limbo MD MBA  ASSISTANT: none  ANESTHESIA:  General.   EBL: 100 ml for entire procedure  COMPLICATIONS: None immediate.   INDICATIONS FOR PROCEDURE:  The patient, [name], is a 48 y.o. female born on [birth year], is here for bilateral immediate prepectoral expander acellular dermis reconstruction following skin reduction pattern mastectomies.    FINDINGS: Natrelle 133S FV-13-T 500 ml tissue expanders placed bilateral initial fill volume 420 ml air bilateral. RIGHT SN [id number] LEFT [id number]  DESCRIPTION OF PROCEDURE:  The patient was marked with the patient in the preoperative area to mark sternal notch, chest midline, anterior axillary lines and inframammary folds. Patient was marked for skin reduction mastectomy with most superior portion nipple areola marked on breast meridian. Vertical limbs marked by breast displacement and set at9cm length. The patient was taken to the operating room. SCDs were placed and IV antibiotics were given. Foley catheter placed. The patient's operative site was prepped and draped in a sterile fashion. A time out was performed and all information was confirmed to be correct. In supine position, the lateral limbs for resection marked and area over lower pole preserved as inferiorly based dermal pedicle. Skin de epithelialized in this area.I assisted in mastectomieswith retraction and exposure.Following completion of mastectomies, reconstruction began onleftside.  The cavity was irrigated with solution containingAncef,bacitracin, and gentamicin. Hemostasis was ensured. A 19 Fr  drain was placed in subcutaneous position laterally anda 15 Fr drain placed along inframammary fold. Eachsecured to skin with 2-0 nylon. Cavity irrigated with Betadine.The tissue expanderswere prepared on back table prior in insertion. The expander was filled with air to466m.Perforated acellular dermis was draped over anterior surface expander. The ADM was then secured to itself over posterior surface of expander. Redundant folds acellular dermis excised so that the ADM lied flat without folds over air filled expander.The expander was secured to medial insertion pectoralis with a 0 vicryl.The superior and lateral tabs also secured to pectoralis muscle with 0-vicryl. The ADM was secured to pectoralis muscle and chest wall along inferior border at inframammary fold.Laterally the mastectomy flap over posterior axillary line was advanced anteriorly and the subcutaneous tissue and superficial fascia was secured to pectoralis muscle and acellular dermis with 0-vicryl. The inferiorly based dermal pedicle was redraped superiorly over expander and acellular dermis and secured to pectoralis with interrupted 0-vicryl. Skin closure completedwith 3-0 vicryl in fascial layer and 4-0 vicryl in dermis. Skin closure completed with 4-0 monocryl subcuticular and tissue adhesive.  I then directed my attention torightchest where similar irrigation and drain placement completed. The prepared expander with ADM secured over anterior surface was placed in right chest and tabs secured to chest wall and pectoralis muscle with 0- vicryl suture. The acellular dermis at inframammary fold was secured to chest wall with 0 V-lock suture.Laterally the mastectomy flap over posterior axillary line was advanced anteriorly and the subcutaneous tissue and superficial fascia was secured to pectoralis muscle and acellular dermis with 0-vicryl. The inferiorly based dermal pedicle was redraped superiorly over expander and acellular dermis  and secured to pectoralis with interrupted 0-vicryl. Skin closure completedwith 3-0 vicryl in fascial layer and 4-0 vicryl in dermis. Skin closure completed with 4-0 monocryl subcuticular  and tissue adhesive.Tegaderms applied bilateral, followed by dry dressing and breast binder.  The patient was allowed to wake from anesthesia, extubated and taken to the recovery room in satisfactory condition.   SPECIMENS: none  DRAINS: 15 and 19 Fr JP in right and left breast reconstruction  Irene Limbo, MD [facility] Plastic & Reconstructive Surgery [phone], pin [phone]

## 2019-01-23 NOTE — Anesthesia Postprocedure Evaluation (Signed)
Anesthesia Post Note  Patient: [name]  Procedure(s) Performed: RIGHT MASTECTOMY WITH RIGHT AXILLARY SENTINEL LYMPH NODE BIOPSY AND LEFT RISK REDUCING MASTECTOMY (Bilateral Breast) BREAST RECONSTRUCTION WITH PLACEMENT OF TISSUE EXPANDER AND FLEX HD (ACELLULAR HYDRATED DERMIS) (Bilateral Breast)     Patient location during evaluation: PACU Anesthesia Type: General Level of consciousness: awake and alert Pain management: pain level controlled Vital Signs Assessment: post-procedure vital signs reviewed and stable Respiratory status: spontaneous breathing, nonlabored ventilation and respiratory function stable Cardiovascular status: blood pressure returned to baseline and stable Postop Assessment: no apparent nausea or vomiting Anesthetic complications: no    Last Vitals:  Vitals:   01/23/19 1440 01/23/19 1600  BP:    Pulse:    Resp:    Temp:    SpO2: 100% 97%    Last Pain:  Vitals:   01/23/19 1600  TempSrc:   PainSc: Asleep                 Catalina Gravel

## 2019-01-23 NOTE — Interval H&P Note (Signed)
History and Physical Interval Note:  01/23/2019 7:09 AM  [name]  has presented today for surgery, with the diagnosis of brca mutation, right breast cancer.  The various methods of treatment have been discussed with the patient and family. After consideration of risks, benefits and other options for treatment, the patient has consented to  Bilateral breast reconstruction with tissue expanders acellular dermis as a surgical intervention.  The patient's history has been reviewed, patient examined, no change in status, stable for surgery.  I have reviewed the patient's chart and labs.  Questions were answered to the patient's satisfaction.     Arnoldo Hooker Drezden Seitzinger

## 2019-01-23 NOTE — Progress Notes (Signed)
Assisted Cheryl, nuc med tech, with nuc med injections. Side rails up, monitors on throughout procedure. See vital signs in flow sheet. Tolerated Procedure well.

## 2019-01-23 NOTE — Interval H&P Note (Signed)
History and Physical Interval Note:  01/23/2019 7:00 AM  [name]  has presented today for surgery, with the diagnosis of brca mutation, right breast cancer.  The various methods of treatment have been discussed with the patient and family. After consideration of risks, benefits and other options for treatment, the patient has consented to  Procedure(s): RIGHT MASTECTOMY WITH RIGHT AXILLARY SENTINEL LYMPH NODE BIOPSY AND LEFT RISK REDUCING MASTECTOMY (Bilateral) BREAST RECONSTRUCTION WITH PLACEMENT OF TISSUE EXPANDER AND FLEX HD (ACELLULAR HYDRATED DERMIS) (Bilateral) as a surgical intervention.  The patient's history has been reviewed, patient examined, no change in status, stable for surgery.  I have reviewed the patient's chart and labs.  Questions were answered to the patient's satisfaction.     Rolm Bookbinder

## 2019-01-23 NOTE — H&P (Signed)
[name] is an 48 y.o. female.   Chief Complaint: breast cancer, brca pos HPI:  46 yof I saw previously for right breast dcis. she has no prior breast history. no mass or dc. 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 she has 6 mm calcs on the right breast. there was question of left breast mass that was not present on dx. 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 she also has an mri that shows nl left breast, nl nodes, right breast with no appreciable mass at site of biopsy and two likely im nodes they are recommending for biopsy. her brother has passed away and she has been in [facility] for a while. she is on tamoxifen as we knew there would be delay due to covid. she has been seen by plastic surgery  Past Medical History:  Diagnosis Date  . Achalasia    S/p Heller myotomy 1991 and numerous dilations  . 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 Family history of breast cancer   . GERD (gastroesophageal reflux disease)    Hx - None since 2013 Heller myotomy surgery  . Headache    migraines  . Hypertension   . Pleural effusion 2013   with Heller myotomy surgery    Past Surgical History:  Procedure Laterality Date  . ACHALASIA SURGERY  1991   transthoracic Heller myotomy in [place]  . BILATERAL SALPINGECTOMY Bilateral 12/02/2013   Procedure: BILATERAL SALPINGECTOMY;  Surgeon: Princess Bruins, MD;  Location: [facility];  Service: Gynecology;  Laterality: Bilateral;  . CESAREAN SECTION     x2  . ESOPHAGOGASTRODUODENOSCOPY  10/08/2011   Procedure: ESOPHAGOGASTRODUODENOSCOPY (EGD);  Surgeon: Jeryl Columbia, MD;  Location: [facility] ENDOSCOPY;  Service: Endoscopy;  Laterality: N/A;  . ESOPHAGOGASTRODUODENOSCOPY  10/16/2011   Procedure: ESOPHAGOGASTRODUODENOSCOPY (EGD);  Surgeon: Cleotis Nipper, MD;  Location: [facility];  Service: Endoscopy;  Laterality:  N/A;  . ESOPHAGOGASTRODUODENOSCOPY  11/06/2011   Procedure: ESOPHAGOGASTRODUODENOSCOPY (EGD);  Surgeon: Lear Ng, MD;  Location: [facility];  Service: Endoscopy;  Laterality: N/A;  . FOREIGN BODY REMOVAL  10/08/2011   Procedure: FOREIGN BODY REMOVAL;  Surgeon: Jeryl Columbia, MD;  Location: [facility] ENDOSCOPY;  Service: Endoscopy;  Laterality: N/A;  botox  needed /ja/magod  . FOREIGN BODY REMOVAL  10/16/2011   Procedure: FOREIGN BODY REMOVAL;  Surgeon: Cleotis Nipper, MD;  Location: [facility] ENDOSCOPY;  Service: Endoscopy;  Laterality: N/A;  . HELLER MYOTOMY  11/07/2011   Procedure: LAPAROSCOPIC HELLER MYOTOMY;  Surgeon: Pedro Earls, MD;  Location: [facility];  Service: General;  Laterality: N/A;  Laparoscopic Redo Heller Myotomy   . ROBOTIC ASSISTED TOTAL HYSTERECTOMY N/A 12/02/2013   Procedure: ROBOTIC ASSISTED TOTAL HYSTERECTOMY;  Surgeon: Princess Bruins, MD;  Location: [facility];  Service: Gynecology;  Laterality: N/A;  . TONSILLECTOMY  1995    Family History  Problem Relation Age of Onset  . Hypertension Mother   . Prostate cancer Father 69  . Breast cancer Paternal Grandmother        late 16s?  . Lung cancer Brother 58  . Other Maternal Grandmother        Childbirth  . Anesthesia problems Neg Hx   . Hypotension Neg Hx   . Malignant hyperthermia Neg Hx   . Pseudochol deficiency Neg Hx  Social History:  reports that she quit smoking about 28 years ago. Her smoking use included cigarettes. She has a 0.13 pack-year smoking history. She has never used smokeless tobacco. She reports current alcohol use of about 1.0 standard drinks of alcohol per week. She reports that she does not use drugs.  Allergies:  Allergies  Allergen Reactions  . Shellfish Allergy Hives    Medications Prior to Admission  Medication Sig Dispense Refill  . lisinopril-hydrochlorothiazide (PRINZIDE,ZESTORETIC) 10-12.5 MG tablet Take 1 tablet by mouth daily.    . tamoxifen (NOLVADEX) 10 MG tablet Take 10 mg by  mouth 2 (two) times daily.    Marland Kitchen EPINEPHrine 0.3 mg/0.3 mL IJ SOAJ injection Inject into the muscle. As needed for shrimp allergy    . ibuprofen (ADVIL,MOTRIN) 800 MG tablet Take 800 mg by mouth every 8 (eight) hours as needed for fever.      Results for orders placed or performed during the hospital encounter of 01/21/19 (from the past 48 hour(s))  SARS Coronavirus 2 (CEPHEID - Performed in [facility] hospital lab), Hosp Order     Status: None   Collection Time: 01/21/19  9:32 AM  Result Value Ref Range   SARS Coronavirus 2 NEGATIVE NEGATIVE    Comment: (NOTE) If result is NEGATIVE SARS-CoV-2 target nucleic acids are NOT DETECTED. The SARS-CoV-2 RNA is generally detectable in upper and lower  respiratory specimens during the acute phase of infection. The lowest  concentration of SARS-CoV-2 viral copies this assay can detect is 250  copies / mL. A negative result does not preclude SARS-CoV-2 infection  and should not be used as the sole basis for treatment or other  patient management decisions.  A negative result may occur with  improper specimen collection / handling, submission of specimen other  than nasopharyngeal swab, presence of viral mutation(s) within the  areas targeted by this assay, and inadequate number of viral copies  (<250 copies / mL). A negative result must be combined with clinical  observations, patient history, and epidemiological information. If result is POSITIVE SARS-CoV-2 target nucleic acids are DETECTED. The SARS-CoV-2 RNA is generally detectable in upper and lower  respiratory specimens dur ing the acute phase of infection.  Positive  results are indicative of active infection with SARS-CoV-2.  Clinical  correlation with patient history and other diagnostic information is  necessary to determine patient infection status.  Positive results do  not rule out bacterial infection or co-infection with other viruses. If result is PRESUMPTIVE POSTIVE SARS-CoV-2  nucleic acids MAY BE PRESENT.   A presumptive positive result was obtained on the submitted specimen  and confirmed on repeat testing.  While 2019 novel coronavirus  (SARS-CoV-2) nucleic acids may be present in the submitted sample  additional confirmatory testing may be necessary for epidemiological  and / or clinical management purposes  to differentiate between  SARS-CoV-2 and other Sarbecovirus currently known to infect humans.  If clinically indicated additional testing with an alternate test  methodology [phone]) is advised. The SARS-CoV-2 RNA is generally  detectable in upper and lower respiratory sp ecimens during the acute  phase of infection. The expected result is Negative. Fact Sheet for Patients:  StrictlyIdeas.no Fact Sheet for Healthcare Providers: BankingDealers.co.za This test is not yet approved or cleared by the [place] FDA and has been authorized for detection and/or diagnosis of SARS-CoV-2 by FDA under an Emergency Use Authorization (EUA).  This EUA will remain in effect (meaning this test can be used) for the  duration of the COVID-19 declaration under Section 564(b)(1) of the Act, 21 U.S.C. section 360bbb-3(b)(1), unless the authorization is terminated or revoked sooner. Performed at [facility], [place] [address], [place], [place] [address]    No results found.  ROS General Not Present- Appetite Loss, Chills, Fatigue, Fever, Night Sweats, Weight Gain and Weight Loss. Skin Not Present- Change in Wart/Mole, Dryness, Hives, Jaundice, New Lesions, Non-Healing Wounds, Rash and Ulcer. HEENT Not Present- Earache, Hearing Loss, Hoarseness, Nose Bleed, Oral Ulcers, Ringing in the Ears, Seasonal Allergies, Sinus Pain, Sore Throat, Visual Disturbances, Wears glasses/contact lenses and Yellow Eyes. Respiratory Present- Snoring. Not Present- Bloody sputum, Chronic Cough, Difficulty Breathing and  Wheezing. Breast Not Present- Breast Mass, Breast Pain, Nipple Discharge and Skin Changes. Cardiovascular Not Present- Chest Pain, Difficulty Breathing Lying Down, Leg Cramps, Palpitations, Rapid Heart Rate, Shortness of Breath and Swelling of Extremities. Gastrointestinal Not Present- Abdominal Pain, Bloating, Bloody Stool, Change in Bowel Habits, Chronic diarrhea, Constipation, Difficulty Swallowing, Excessive gas, Gets full quickly at meals, Hemorrhoids, Indigestion, Nausea, Rectal Pain and Vomiting. Musculoskeletal Not Present- Back Pain, Joint Pain, Joint Stiffness, Muscle Pain, Muscle Weakness and Swelling of Extremities. Neurological Not Present- Decreased Memory, Fainting, Headaches, Numbness, Seizures, Tingling, Tremor, Trouble walking and Weakness. Psychiatric Not Present- Anxiety, Bipolar, Change in Sleep Pattern, Depression, Fearful and Frequent crying. Endocrine Not Present- Cold Intolerance, Excessive Hunger, Hair Changes, Heat Intolerance, Hot flashes and New Diabetes. Hematology Not Present- Blood Thinners, Easy Bruising, Excessive bleeding, Gland problems, HIV and Persistent Infections. Blood pressure 137/78, pulse 74, temperature 98.3 F (36.8 C), temperature source Oral, resp. rate 18, height _0  (1.676 m), weight 104 kg, last menstrual period 11/17/2013, SpO2 99 %.   Physical Exam  General Mental Status-Alert. Head and Neck Trachea-midline. Thyroid Gland Characteristics - normal size and consistency. Eye Sclera/Conjunctiva - Bilateral-No scleral icterus. Chest and Lung Exam Chest and lung exam reveals -quiet, even and easy respiratory effort with no use of accessory muscles. Breast Nipples-No Discharge. Breast Lump-No Palpable Breast Mass. Cardiovascular Cardiovascular examination reveals -normal heart sounds, regular rate and rhythm with no murmurs. Neurologic Neurologic evaluation reveals -alert and oriented x 3 with no impairment of recent or  remote memory. Lymphatic Head & Neck General Head & Neck Lymphatics: Bilateral - Description - Normal. Axillary General Axillary Region: Bilateral - Description - Normal. 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 she would like to do rrm on the left side. we discussed this was not 100% preventive and would just be followed with exams. BREAST NEOPLASM, TIS (DCIS), RIGHT (D05.11) Story: right mastectomy , right axillary sn biopsy discussed surgery, rationale for sn biopsy which she agrees to. discussed final therapy based on pathology but hopefully will only need surgery. will combine with expander reconstruction  Rolm Bookbinder, MD 01/23/2019, 6:58 AM

## 2019-01-23 NOTE — Progress Notes (Signed)
Assisted Dr. Gifford Shave with right, left, ultrasound guided, pectoralis block. Side rails up, monitors on throughout procedure. See vital signs in flow sheet. Tolerated Procedure well.

## 2019-01-23 NOTE — Op Note (Signed)
Preoperative diagnosis: Right DCIS, brca positive Postoperative diagnosis: same as above Procedure: 1. Left risk reducing mastectomy 2. Right mastectomy 3. Right deep axillary sentinel node biopsy Surgeon: Dr Serita Grammes Asst: Dr Irene Limbo EBL: 75 cc Drains per plastic surgery Specimens: 1. Left breast marked short superior, long lateral 2. Right breast marked short superior, long lateral 3. Right axillary sentinel nodes with highest count 9323 Complications none Sponge and needle count correct times two dispo to recovery stable  Indications: this is a 33 yof who had calcifications in the right upper outer quadrant and biopsy was dcis.  She then was found to be brca positive. We discussed options and elected to proceed with bilateral mastectomies and right axillary sentinel node biopsy combined with expander reconstruction.  Procedure: After informed consent was obtained the patient first underwent bilateral pectoral blocks and was injected with technetium in the standard periareolar fashion on the right.  She was given antibiotics.  SCDs were in place.  She was then placed under general anesthesia without complication.  She was prepped and draped in the standard sterile surgical fashion.  A surgical timeout was then performed.  I did the left risk reducing mastectomy first.  We marked a triangular incision around the nipple areolar complex to do the skin reduction mastectomy.  I then made an incision and created flaps to the parasternal region, clavicle, latissimus, and inframammary fold.  I then remove the breast and the pectoralis fascia from the pectoralis muscle.  I then marked this as above.  This was passed off the table.  I then he obtained hemostasis and placed a sponge on this side.  I then went to the right side.  I made a similar triangular shaped incision that had been marked previously.  I created flaps in a similar fashion as the other side.  I then remove the breast  and the pectoralis fashion from the pectoralis muscle.  She had a several low-lying axillary nodes that were the sentinel node that I detected with the neoprobe.  I remove these through the same incision and sent them off separately.  There was really no background radioactivity in the axilla.  There were no palpable nodes.  I then obtained hemostasis.  I closed the axilla with 2-0 Vicryl suture.  I then packed this and turned the case over to plastic surgery for reconstruction.

## 2019-01-23 NOTE — Anesthesia Procedure Notes (Signed)
Procedure Name: Intubation Date/Time: 01/23/2019 7:40 AM Performed by: Lieutenant Diego, CRNA Pre-anesthesia Checklist: Patient identified, Emergency Drugs available, Suction available and Patient being monitored Patient Re-evaluated:Patient Re-evaluated prior to induction Oxygen Delivery Method: Circle system utilized Preoxygenation: Pre-oxygenation with 100% oxygen Induction Type: IV induction Ventilation: Mask ventilation without difficulty Laryngoscope Size: Miller and 2 Grade View: Grade II Tube type: Oral Tube size: 7.0 mm Number of attempts: 1 Airway Equipment and Method: Stylet and Oral airway Placement Confirmation: ETT inserted through vocal cords under direct vision,  positive ETCO2 and breath sounds checked- equal and bilateral Secured at: 22 cm Tube secured with: Tape Dental Injury: Teeth and Oropharynx as per pre-operative assessment

## 2019-01-23 NOTE — Anesthesia Procedure Notes (Signed)
Anesthesia Regional Block: Pectoralis block   Pre-Anesthetic Checklist: ,, timeout performed, Correct Patient, Correct Site, Correct Laterality, Correct Procedure, Correct Position, site marked, Risks and benefits discussed,  Surgical consent,  Pre-op evaluation,  At surgeon's request and post-op pain management  Laterality: Left  Prep: chloraprep       Needles:  Injection technique: Single-shot  Needle Type: Echogenic Needle     Needle Length: 9cm  Needle Gauge: 21     Additional Needles:   Procedures:,,,, ultrasound used (permanent image in chart),,,,  Narrative:  Start time: 01/23/2019 7:14 AM End time: 01/23/2019 7:19 AM Injection made incrementally with aspirations every 5 mL.  Performed by: Personally  Anesthesiologist: Catalina Gravel, MD  Additional Notes: No pain on injection. No increased resistance to injection. Injection made in 5cc increments.  Good needle visualization.  Patient tolerated procedure well.

## 2019-01-24 DIAGNOSIS — D0511 Intraductal carcinoma in situ of right breast: Secondary | ICD-10-CM | POA: Diagnosis not present

## 2019-01-24 NOTE — Discharge Instructions (Signed)
[facility] °[phone] ° °MASTECTOMY: POST OP INSTRUCTIONS °Take 400 mg of ibuprofen every 8 hours or 650 mg tylenol every 6 hours for next 72 hours then as needed. Use ice several times daily also. °Always review your discharge instruction sheet given to you by the facility where your surgery was performed. ° °IF YOU HAVE DISABILITY OR FAMILY LEAVE FORMS, YOU MUST BRING THEM TO THE OFFICE FOR PROCESSING.   °DO NOT GIVE THEM TO YOUR DOCTOR. °A prescription for pain medication may be given to you upon discharge.  Take your pain medication as prescribed, if needed.  If narcotic pain medicine is not needed, then you may take acetaminophen (Tylenol), naprosyn (Alleve) or ibuprofen (Advil) as needed. °1. Take your usually prescribed medications unless otherwise directed. °2. If you need a refill on your pain medication, please contact your pharmacy.  They will contact our office to request authorization.  Prescriptions will not be filled after 5pm or on week-ends. °3. You should follow a light diet the first few days after arrival home, such as soup and crackers, etc.  Resume your normal diet the day after surgery. °4. Most patients will experience some swelling and bruising on the chest and underarm.  Ice packs will help.  Swelling and bruising can take several days to resolve. Wear the binder day and night until you return to the office.  °5. It is common to experience some constipation if taking pain medication after surgery.  Increasing fluid intake and taking a stool softener (such as Colace) will usually help or prevent this problem from occurring.  A mild laxative (Milk of Magnesia or Miralax) should be taken according to package instructions if there are no bowel movements after 48 hours. °6. Unless discharge instructions indicate otherwise, leave your bandage dry and in place until your next appointment in 3-5 days.  You may take a limited sponge bath.  No tube baths or showers until the  drains are removed.  You may have steri-strips (small skin tapes) in place directly over the incision.  These strips should be left on the skin for 7-10 days. If you have glue it will come off in next couple week.  Any sutures will be removed at an office visit °7. DRAINS:  If you have drains in place, it is important to keep a list of the amount of drainage produced each day in your drains.  Before leaving the hospital, you should be instructed on drain care.  Call our office if you have any questions about your drains. I will remove your drains when they put out less than 30 cc or ml for 2 consecutive days. °8. ACTIVITIES:  You may resume regular (light) daily activities beginning the next day--such as daily self-care, walking, climbing stairs--gradually increasing activities as tolerated.  You may have sexual intercourse when it is comfortable.  Refrain from any heavy lifting or straining until approved by your doctor. °a. You may drive when you are no longer taking prescription pain medication, you can comfortably wear a seatbelt, and you can safely maneuver your car and apply brakes. °b. RETURN TO WORK:  __________________________________________________________ °9. You should see your doctor in the office for a follow-up appointment approximately 3-5 days after your surgery.  Your doctor’s nurse will typically make your follow-up appointment when she calls you with your pathology report.  Expect your pathology report 3-4business days after surgery. °10. OTHER INSTRUCTIONS: ______________________________________________________________________________________________ ____________________________________________________________________________________________ °WHEN TO CALL YOUR DR WAKEFIELD: °1. Fever over 101.0 °  Nausea and/or vomiting 3. Extreme swelling or bruising 4. Continued bleeding from incision. 5. Increased pain, redness, or drainage from the incision. The clinic staff is available to answer your  questions during regular business hours.  Please don't hesitate to call and ask to speak to one of the nurses for clinical concerns.  If you have a medical emergency, go to the nearest emergency room or call 911.  A surgeon from [facility] is always on call at the hospital. [address], [address], [place], [place]  [address] ? [address] [address], [place], [place]   [address] [phone] ? [phone] ? FAX [phone] Web site: [email or web address]   About my Jackson-Pratt Bulb Drain  What is a Jackson-Pratt bulb? A Jackson-Pratt is a soft, round device used to collect drainage. It is connected to a long, thin drainage catheter, which is held in place by one or two small stiches near your surgical incision site. When the bulb is squeezed, it forms a vacuum, forcing the drainage to empty into the bulb.  Emptying the Jackson-Pratt bulb- To empty the bulb: 1. Release the plug on the top of the bulb. 2. Pour the bulb's contents into a measuring container which your nurse will provide. 3. Record the time emptied and amount of drainage. Empty the drain(s) as often as your     doctor or nurse recommends.  Date                  Time                    Amount (Drain 1)                 Amount (Drain 2)  _____________________________________________________________________  _____________________________________________________________________  _____________________________________________________________________  _____________________________________________________________________  _____________________________________________________________________  _____________________________________________________________________  _____________________________________________________________________  _____________________________________________________________________  Squeezing the Jackson-Pratt Bulb- To squeeze the bulb: 1. Make sure the plug at the top of the bulb  is open. 2. Squeeze the bulb tightly in your fist. You will hear air squeezing from the bulb. 3. Replace the plug while the bulb is squeezed. 4. Use a safety pin to attach the bulb to your clothing. This will keep the catheter from     pulling at the bulb insertion site.  When to call your doctor- Call your doctor if:  Drain site becomes red, swollen or hot.  You have a fever greater than 101 degrees F.  There is oozing at the drain site.  Drain falls out (apply a guaze bandage over the drain hole and secure it with tape).  Drainage increases daily not related to activity patterns. (You will usually have more drainage when you are active than when you are resting.)  Drainage has a bad odor.   

## 2019-01-24 NOTE — Discharge Summary (Signed)
Physician Discharge Summary  Patient ID: [name] MRN: [id number] DOB/AGE: March 13, 1971 48 y.o.  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 Patient and friend instructed on drain care.  Treatments: surgery: bilateral skin reduction mastectomies right SLN bilateral breast reconstruction with tissue expanders acellular dermis 5.15.20  Discharge Exam: Blood pressure 133/88, pulse 78, temperature 98.9 F (37.2 C), resp. rate 20, height 5' 6"  (1.676 m), weight 104 kg, last menstrual period 11/17/2013, SpO2 100 %. Incision/Wound: chest soft incisions dry intact Tegaderms in place drains serosanguinous  Disposition: Discharge disposition: 01-Home or Self Care       Discharge Instructions    Call MD for:  redness, tenderness, or signs of infection (pain, swelling, bleeding, redness, odor or green/yellow discharge around incision site)   Complete by:  As directed    Call MD for:  temperature >100.5   Complete by:  As directed    Discharge instructions   Complete by:  As directed    Ok to remove dressings and shower am 5.17.20. Soap and water ok, pat Tegaderms dry. Do not remove Tegaderms. No creams or ointments over incisions. Do not let drains dangle in shower, attach to lanyard or similar. Strip and record drains twice daily and bring log to clinic visit.  Breast binder or soft compression bra all other times.  Ok to raise arms above shoulders for bathing and dressing.  No house yard work or exercise until cleared by MD.   Recommend Miralax or Dulcolax as needed for constipation. Recommend ibuprofen with meals to aid with pain control. Also ok to take  Tylenol. Patient received all other Rx preoperatively.   Driving Restrictions   Complete by:  As directed    No driving for 2 weeks then no driving if taking narcotics   Lifting restrictions   Complete by:  As directed    No lifting > 5--10 lbs until cleared by MD   Resume previous diet   Complete by:  As directed       Follow-up Information    Rolm Bookbinder, MD In 2 weeks.   Specialty:  General Surgery Contact information: [address] [place] [address] [phone]        Irene Limbo, MD In 1 week.   Specialty:  Plastic Surgery Why:  as scheduled Contact information: [address] [place] [address] [phone]           Signed: Irene Limbo 01/24/2019, 7:41 AM

## 2019-01-26 ENCOUNTER — Telehealth: Payer: Self-pay | Admitting: Genetic Counselor

## 2019-01-26 NOTE — Telephone Encounter (Signed)
I returned the patient's call.  Her daughter is going to her PCP to get testing, and she wanted to know what she needed to do in order to allow her daughter to obtain the complementary testing.  I let her know that her PCP needed to send the test to [facility].  Some PCP offices are set up for other labs, so if it goes elsewhere, then complementary testing will not be performed.  She will also need to provide a copy of the test result.

## 2019-01-27 ENCOUNTER — Other Ambulatory Visit: Payer: Self-pay | Admitting: Oncology

## 2019-01-27 ENCOUNTER — Encounter (HOSPITAL_BASED_OUTPATIENT_CLINIC_OR_DEPARTMENT_OTHER): Payer: Self-pay | Admitting: General Surgery

## 2019-01-30 ENCOUNTER — Ambulatory Visit: Payer: Managed Care, Other (non HMO) | Admitting: Oncology

## 2019-02-03 ENCOUNTER — Telehealth: Payer: Self-pay | Admitting: Oncology

## 2019-02-03 NOTE — Telephone Encounter (Signed)
Called patient regarding upcoming Webex appointment, patient is notified and e-mall has been sent.

## 2019-02-05 ENCOUNTER — Telehealth: Payer: Self-pay | Admitting: Oncology

## 2019-02-05 NOTE — Progress Notes (Signed)
[name]  Telephone:([phone]) [phone] [phone]) [phone]    ID: [name] DOB: 04-May-1971  MR#: [id number]  HQP#:[id number]  Patient Care Team: Zara Chess, NP as PCP - General (Nurse Practitioner) Mauro Kaufmann, RN as Oncology Nurse Navigator Rockwell Germany, RN as Oncology Nurse Navigator Kyung Rudd, MD as Consulting Physician (Radiation Oncology) Magrinat, Virgie Dad, MD as Consulting Physician (Oncology) Rolm Bookbinder, MD as Consulting Physician (General Surgery) [name], CNM as Consulting Physician (Obstetrics and Gynecology) Chauncey Cruel, MD OTHER MD:   CHIEF COMPLAINT: Ductal carcinoma in situ in BRCA positive patient  CURRENT TREATMENT: BSO pending   I connected with [name] on 02/06/19 at 10:30 AM EDT by video enabled telemedicine visit and verified that I am speaking with the correct person using two identifiers.   I discussed the limitations, risks, security and privacy concerns of performing an evaluation and management service by telemedicine and the availability of in-person appointments. I also discussed with the patient that there may be a patient responsible charge related to this service. The patient expressed understanding and agreed to proceed.   Other persons participating in the visit and their role in the encounter:   - Biomedical scientist, Medical Scribe   Patients location: Home  Providers location: [facility]   Chief Complaint: Ductal carcinoma in situ, estrogen receptor positive    HISTORY OF CURRENT ILLNESS: From the original intake note:   [name] presented for wellness exam with her OBGYN and was found to have a palpable 1 cm mass at 3 o'clock in the right breast. Axillary adenopathy was also noted in the left side, which however the patient states has been present for years. She proceeded to screening mammography at Dr. Delena Bali office on 11/25/2018 showing possible abnormalities in the right and  left breasts. She underwent bilateral diagnostic mammography with tomography at [facility] on 12/01/2018 showing: breast density category B; indeterminate 6 mm group of calcifications within the upper outer right breast; no persistent abnormality in the area of the left breast.  Accordingly on 12/01/2018 she proceeded to biopsy of the right breast area in question. The pathology from this procedure ([id number]) showed: ductal carcinoma in situ with calcifications, high grade. Prognostic indicators significant for: estrogen receptor, 100% positive and progesterone receptor, 100% positive, both with strong staining intensity.    The patient's subsequent history is as detailed below.   INTERVAL HISTORY: [name] was seen today for follow-up and treatment of her estrogen receptor positive ductal carcinoma in situ.  Since her last visit here, she underwent genetic testing on 12/10/2018 through the common hereditary Cancer Panel offered by [facility] identified a single, heterozygous pathogenic gene mutation called BRCA2, c.[id number]. There were no deleterious mutations in APC, ATM, AXIN2, BARD1, BMPR1A, BRCA1, BRCA2, BRIP1, CDH1, CDK4, CDKN2A, CHEK2, CTNNA1, DICER1, EPCAM, GREM1, HOXB13, KIT, MEN1, MLH1, MSH2, MSH3, MSH6, MUTYH, NBN, NF1, NTHL1, PALB2, PDGFRA, PMS2, POLD1, POLE, PTEN, RAD50, RAD51C, RAD51D, RNF43, SDHA, SDHB, SDHC, SDHD, SMAD4, SMARCA4. STK11, TP53, TSC1, TSC2, and VHL.  She also underwent a bilateral breast MRI with and without contrast on 01/13/2019 showing: Breast Density Category C. There is a 9 mm mass within the upper-outer right breast anterior depth with intrinsic T2 signal which may represent intramammary lymph node. There is an adjacent 5 mm mass which may represent a small adjacent intramammary lymph node. Possibility of malignancy at this location is not excluded. Susceptibility artifact within the upper-outer right breast posterior depth  compatible with biopsy marking clip at the  site of recently diagnosed carcinoma. No surrounding abnormal enhancement to suggest extensive local disease.   She then underwent a right mastectomy on 01/23/2019. The pathology from this procedure showed ([id number]):  1. breast, simple mastectomy, left - fibroadenoma. - fibrocystic change. - pseudoangiomatous stromal hyperplasia (pash). - two of two lymph nodes negative for carcinoma (0/2). - no malignancy identified. 2. breast, simple mastectomy, right - high grade ductal carcinoma in situ, spanning 0.5 cm. - resection margins are negative. - biopsy site. - fibroadenoma. - fibrocystic change. - pseudoangiomatous stromal hyperplasia (pash). - see oncology table. 3. lymph node, sentinel, biopsy, right axillary - one of one lymph nodes negative for carcinoma (0/1). 4. lymph node, sentinel, biopsy, right - one of one lymph nodes negative for carcinoma (0/1). 5. lymph node, sentinel, biopsy, right - one of one lymph nodes negative for carcinoma (0/1). 6. lymph node, sentinel, biopsy, right   - one of one lymph nodes negative for carcinoma (0/1).  She has an appointment with Dr. Donne Hazel at 1:30pm today to discuss having her overies removed.     REVIEW OF SYSTEMS:  [name] had her drains removed on Wednesday, 02/04/2019. She has some residual pain, primarily in her axilla at the lymph node biopsy, for which she takes 5 mg of oxycodone before bed to help her sleep. The patient denies unusual headaches, visual changes, nausea, vomiting, or dizziness. There has been no unusual cough, phlegm production, or pleurisy. This been no change in bowel or bladder habits. The patient denies unexplained fatigue or unexplained weight loss, bleeding, rash, or fever. A detailed review of systems was otherwise noncontributory.    PAST MEDICAL HISTORY: Past Medical History:  Diagnosis Date   Achalasia    S/p Heller myotomy 1991 and numerous dilations   Breast cancer ([place]) 12/01/2018   right, DCIS     Family history of breast cancer    GERD (gastroesophageal reflux disease)    Hx - None since 2013 Heller myotomy surgery   Headache    migraines   Hypertension    Pleural effusion 2013   with Heller myotomy surgery    PAST SURGICAL HISTORY: Past Surgical History:  Procedure Laterality Date   ACHALASIA SURGERY  1991   transthoracic Heller myotomy in [place]   BILATERAL SALPINGECTOMY Bilateral 12/02/2013   Procedure: BILATERAL SALPINGECTOMY;  Surgeon: Princess Bruins, MD;  Location: [facility];  Service: Gynecology;  Laterality: Bilateral;   BREAST RECONSTRUCTION WITH PLACEMENT OF TISSUE EXPANDER AND FLEX HD (ACELLULAR HYDRATED DERMIS) Bilateral 01/23/2019   Procedure: BREAST RECONSTRUCTION WITH PLACEMENT OF TISSUE EXPANDER AND FLEX HD (ACELLULAR HYDRATED DERMIS);  Surgeon: Irene Limbo, MD;  Location: [facility];  Service: Plastics;  Laterality: Bilateral;   CESAREAN SECTION     x2   ESOPHAGOGASTRODUODENOSCOPY  10/08/2011   Procedure: ESOPHAGOGASTRODUODENOSCOPY (EGD);  Surgeon: Jeryl Columbia, MD;  Location: [facility];  Service: Endoscopy;  Laterality: N/A;   ESOPHAGOGASTRODUODENOSCOPY  10/16/2011   Procedure: ESOPHAGOGASTRODUODENOSCOPY (EGD);  Surgeon: Cleotis Nipper, MD;  Location: [facility];  Service: Endoscopy;  Laterality: N/A;   ESOPHAGOGASTRODUODENOSCOPY  11/06/2011   Procedure: ESOPHAGOGASTRODUODENOSCOPY (EGD);  Surgeon: Lear Ng, MD;  Location: [facility];  Service: Endoscopy;  Laterality: N/A;   FOREIGN BODY REMOVAL  10/08/2011   Procedure: FOREIGN BODY REMOVAL;  Surgeon: Jeryl Columbia, MD;  Location: [facility] ENDOSCOPY;  Service: Endoscopy;  Laterality: N/A;  botox  needed /ja/magod   FOREIGN BODY REMOVAL  10/16/2011  Procedure: FOREIGN BODY REMOVAL;  Surgeon: Cleotis Nipper, MD;  Location: [facility] ENDOSCOPY;  Service: Endoscopy;  Laterality: N/A;   HELLER MYOTOMY  11/07/2011   Procedure: LAPAROSCOPIC HELLER MYOTOMY;  Surgeon: Pedro Earls, MD;  Location: [facility];  Service: General;  Laterality: N/A;  Laparoscopic Redo Heller Myotomy    MASTECTOMY W/ SENTINEL NODE BIOPSY Bilateral 01/23/2019   Procedure: RIGHT MASTECTOMY WITH RIGHT AXILLARY SENTINEL LYMPH NODE BIOPSY AND LEFT RISK REDUCING MASTECTOMY;  Surgeon: Rolm Bookbinder, MD;  Location: [facility];  Service: General;  Laterality: Bilateral;   ROBOTIC ASSISTED TOTAL HYSTERECTOMY N/A 12/02/2013   Procedure: ROBOTIC ASSISTED TOTAL HYSTERECTOMY;  Surgeon: Princess Bruins, MD;  Location: [facility];  Service: Gynecology;  Laterality: N/A;   TONSILLECTOMY  1995    FAMILY HISTORY Family History  Problem Relation Age of Onset   Hypertension Mother    Prostate cancer Father 56   Breast cancer Paternal Grandmother        late 54s?   Lung cancer Brother 77   Other Maternal Grandmother        Childbirth   Anesthesia problems Neg Hx    Hypotension Neg Hx    Malignant hyperthermia Neg Hx    Pseudochol deficiency Neg Hx    As of April 2020 patient's father is alive at 79 years old. He was diagnosed with prostate cancer around age 91 and is doing well. Patient's mother is also living at age 87. The patient notes a family hx of breast cancer. Her paternal grandmother was diagnosed with breast cancer and died from the disease around 57. The patient denies a family hx of ovarian cancer. She has 2 brothers, no sisters.   GYNECOLOGIC HISTORY:  Patient's last menstrual period was 11/17/2013. Menarche: 48 years old Age at first live birth: 48 years old [place] P 2 LMP 2013 Contraceptive no HRT no  Hysterectomy? Yes, 12/02/2013 BSO?  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 She is married. Husband Jeneen Rinks is a Administrator. She lives at home with her husband. Daughter [name], age 98, is a Presenter, broadcasting at [employer]. Daughter [name], age 62, is a Paramedic in high school here  in [place].  The patient attends [employer]).  ADVANCED DIRECTIVES: In the absence of any documents to the contrary the patient's husband is her healthcare power of attorney   HEALTH MAINTENANCE: Social History   Tobacco Use   Smoking status: Former Smoker    Packs/day: 0.25    Years: 0.50    Pack years: 0.12    Types: Cigarettes    Last attempt to quit: 10/07/1990    Years since quitting: 28.3   Smokeless tobacco: Never Used   Tobacco comment: Smoked for 2 months, nothing heavier  Substance Use Topics   Alcohol use: Yes    Alcohol/week: 1.0 standard drinks    Types: 1 Glasses of wine per week    Comment: Rarely   Drug use: No     Colonoscopy: never done  PAP: 2013  Bone density: never done   Allergies  Allergen Reactions   Shellfish Allergy Hives    Current Outpatient Medications  Medication Sig Dispense Refill   EPINEPHrine 0.3 mg/0.3 mL IJ SOAJ injection Inject into the muscle. As needed for shrimp allergy     ibuprofen (ADVIL,MOTRIN) 800 MG tablet Take 800 mg by mouth every 8 (eight) hours as needed  for fever.     lisinopril-hydrochlorothiazide (PRINZIDE,ZESTORETIC) 10-12.5 MG tablet Take 1 tablet by mouth daily.     No current facility-administered medications for this visit.     OBJECTIVE: Young appearing African-American woman who appears stated age  There were no vitals filed for this visit.   There is no height or weight on file to calculate BMI.   Wt Readings from Last 3 Encounters:  01/23/19 229 lb 4.5 oz (104 kg)  12/10/18 232 lb 3.2 oz (105.3 kg)  10/28/18 230 lb (104.3 kg)      ECOG FS:2 - Symptomatic, <50% confined to bed   LAB RESULTS:  CMP     Component Value Date/Time   NA 140 01/15/2019 1500   K 4.1 01/15/2019 1500   CL 107 01/15/2019 1500   CO2 24 01/15/2019 1500   GLUCOSE 104 (H) 01/15/2019 1500   BUN 11 01/15/2019 1500   CREATININE 0.87 01/15/2019 1500   CREATININE 0.96 12/10/2018 1212   CREATININE  0.78 07/01/2014 1019   CALCIUM 9.2 01/15/2019 1500   PROT 8.2 (H) 12/10/2018 1212   ALBUMIN 4.5 12/10/2018 1212   AST 19 12/10/2018 1212   ALT 22 12/10/2018 1212   ALKPHOS 57 12/10/2018 1212   BILITOT 0.9 12/10/2018 1212   GFRNONAA >60 01/15/2019 1500   GFRNONAA >60 12/10/2018 1212   GFRNONAA >89 07/01/2014 1019   GFRAA >60 01/15/2019 1500   GFRAA >60 12/10/2018 1212   GFRAA >89 07/01/2014 1019    No results found for: TOTALPROTELP, ALBUMINELP, A1GS, A2GS, BETS, BETA2SER, GAMS, MSPIKE, SPEI  No results found for: KPAFRELGTCHN, LAMBDASER, KAPLAMBRATIO  Lab Results  Component Value Date   WBC 8.5 12/10/2018   NEUTROABS 6.2 12/10/2018   HGB 11.6 (L) 12/10/2018   HCT 36.7 12/10/2018   MCV 66.6 (L) 12/10/2018   PLT 274 12/10/2018    _0 @  No results found for: LABCA2  No components found for: [id number]  No results for input(s): INR in the last 168 hours.  No results found for: LABCA2  No results found for: YNW295  No results found for: AOZ308  No results found for: MVH846  No results found for: CA2729  No components found for: HGQUANT  No results found for: CEA1 / No results found for: CEA1   No results found for: AFPTUMOR  No results found for: CHROMOGRNA  No results found for: PSA1  No visits with results within 3 Day(s) from this visit.  Latest known visit with results is:  Admission on 01/23/2019, Discharged on 01/24/2019  Component Date Value Ref Range Status   Sodium 01/15/2019 140  135 - 145 mmol/L Final   Potassium 01/15/2019 4.1  3.5 - 5.1 mmol/L Final   Chloride 01/15/2019 107  98 - 111 mmol/L Final   CO2 01/15/2019 24  22 - 32 mmol/L Final   Glucose, Bld 01/15/2019 104* 70 - 99 mg/dL Final   BUN 01/15/2019 11  6 - 20 mg/dL Final   Creatinine, Ser 01/15/2019 0.87  0.44 - 1.00 mg/dL Final   Calcium 01/15/2019 9.2  8.9 - 10.3 mg/dL Final   GFR calc non Af Amer 01/15/2019 >60  >60 mL/min Final   GFR calc Af Amer 01/15/2019  >60  >60 mL/min Final   Anion gap 01/15/2019 9  5 - 15 Final   Performed at [facility], [place] [address], [place], [place] [address]    (this displays the last labs from the last 3 days)  No results  found for: TOTALPROTELP, ALBUMINELP, A1GS, A2GS, BETS, BETA2SER, GAMS, MSPIKE, SPEI (this displays SPEP labs)  No results found for: KPAFRELGTCHN, LAMBDASER, KAPLAMBRATIO (kappa/lambda light chains)  No results found for: HGBA, HGBA2QUANT, HGBFQUANT, HGBSQUAN (Hemoglobinopathy evaluation)   Lab Results  Component Value Date   LDH 130 10/19/2011    Lab Results  Component Value Date   IRON 18 (L) 10/19/2011   TIBC 170 (L) 10/19/2011   IRONPCTSAT 11 (L) 10/19/2011   (Iron and TIBC)  Lab Results  Component Value Date   FERRITIN 1,113 (H) 10/19/2011    Urinalysis    Component Value Date/Time   COLORURINE YELLOW 10/18/2011 2246   APPEARANCEUR CLEAR 10/18/2011 2246   LABSPEC 1.015 10/18/2011 2246   PHURINE 6.0 10/18/2011 2246   GLUCOSEU NEGATIVE 10/18/2011 2246   HGBUR MODERATE (A) 10/18/2011 2246   BILIRUBINUR NEGATIVE 10/18/2011 2246   KETONESUR NEGATIVE 10/18/2011 2246   PROTEINUR NEGATIVE 10/18/2011 2246   UROBILINOGEN 1.0 10/18/2011 2246   NITRITE NEGATIVE 10/18/2011 2246   LEUKOCYTESUR NEGATIVE 10/18/2011 2246     STUDIES: Mr Breast Bilateral W Wo Contrast Inc Cad  Result Date: 01/13/2019 CLINICAL DATA:  Patient with recent diagnosis of right breast DCIS. EXAM: BILATERAL BREAST MRI WITH AND WITHOUT CONTRAST TECHNIQUE: Multiplanar, multisequence MR images of both breasts were obtained prior to and following the intravenous administration of 10 ml of Gadavist Three-dimensional MR images were rendered by post-processing of the original MR data on an independent workstation. The three-dimensional MR images were interpreted, and findings are reported in the following complete MRI report for this study. Three dimensional images were evaluated at the independent DynaCad  workstation COMPARISON:  Previous exam(s). FINDINGS: Breast composition: c. Heterogeneous fibroglandular tissue. Background parenchymal enhancement: Mild Right breast: Susceptibility artifact is demonstrated within the upper-outer right breast posterior depth (image 90; series 3) compatible with biopsy marking clip. No suspicious enhancement identified at this location to suggest extensive local disease. Within the upper-outer right breast anterior depth there is a 9 x 6 mm oval mass (image 63; series 6) with intrinsic T2 signal. Immediately medial to this mass is a 5 mm intrinsically T2 bright mass. No additional suspicious areas of enhancement identified within the right breast. Left breast: No mass or abnormal enhancement. Lymph nodes: No abnormal appearing lymph nodes. Ancillary findings:  None. IMPRESSION: 1. There is a 9 mm mass within the upper-outer right breast anterior depth with intrinsic T2 signal which may represent intramammary lymph node. There is an adjacent 5 mm mass which may represent a small adjacent intramammary lymph node. Possibility of malignancy at this location is not excluded. 2. Susceptibility artifact within the upper-outer right breast posterior depth compatible with biopsy marking clip at the site of recently diagnosed carcinoma. No surrounding abnormal enhancement to suggest extensive local disease. RECOMMENDATION: 1. Second-look ultrasound of the upper-outer right breast anterior depth to evaluate the 9 mm and adjacent 5 mm masses. If these are adequately evaluated/assessed with ultrasound, recommend additional management be based on sonographic findings. If these masses are not confidently evaluated with ultrasound, MRI guided core needle biopsy is recommended. 2. Treatment plan for known right breast DCIS. BI-RADS CATEGORY  4: Suspicious. Electronically Signed   By: Lovey Newcomer M.D.   On: 01/13/2019 16:45   Nm Sentinel Node Inj-no Rpt (breast)  Result Date: 01/23/2019 Sulfur  colloid was injected by the nuclear medicine technologist for melanoma sentinel node.    ELIGIBLE FOR AVAILABLE RESEARCH PROTOCOL: no   ASSESSMENT: 48 y.o. [name] woman status  post right breast upper outer quadrant biopsy 12/01/2018 for a 0.6 cm ductal carcinoma in situ, grade 3, strongly estrogen and progesterone receptor positive  (1) genetic testing on 12/10/2018 through the common hereditary Cancer Panel offered by [facility] identified a single, heterozygous pathogenic gene mutation called BRCA2, c.[id number]. There were no deleterious mutations in APC, ATM, AXIN2, BARD1, BMPR1A, BRCA1, BRCA2, BRIP1, CDH1, CDK4, CDKN2A, CHEK2, CTNNA1, DICER1, EPCAM, GREM1, HOXB13, KIT, MEN1, MLH1, MSH2, MSH3, MSH6, MUTYH, NBN, NF1, NTHL1, PALB2, PDGFRA, PMS2, POLD1, POLE, PTEN, RAD50, RAD51C, RAD51D, RNF43, SDHA, SDHB, SDHC, SDHD, SMAD4, SMARCA4. STK11, TP53, TSC1, TSC2, and VHL.  (2) status post bilateral mastectomies and right-sided sentinel lymph node sampling 01/23/2019 showing  (a) on the right, a Tis N0, stage 0 ductal carcinoma, 0.5 cm, with negative margins; total of 4 lymph nodes removed  (b) on the left, no evidence of malignancy  (3) tamoxifen started neoadjuvantly on 12/10/2018, discontinued May 2020  (4) bilateral salpingo-oophorectomy to be scheduled   PLAN: [name] is making an uneventful recovery from her bilateral mastectomies and she will be seeing her surgeon later on today.  She is managing the pain appropriately.  She has had no unusual bleeding, fever, or swelling.  She is interested in proceeding with reconstruction.  Given her BRCA mutation she will benefit from bilateral salpingo-oophorectomy.  She would like to have this done before the end of the year for insurance purposes.  She understands as far as breast cancer is concerned she is cured.  She does not need any adjuvant treatment.  She is going to see me in October.  At that point we will discuss her BRCA positivity further  and decide on further follow-up  She knows to call for any other issue that may develop before that visit.  Magrinat, Virgie Dad, MD  02/06/19 11:10 AM Medical Oncology and Hematology [facility] [address] [place], [place] [address] Tel. [phone]    Fax. [phone]   I, Jacqualyn Posey am acting as a Education administrator for Chauncey Cruel, MD.   I, Lurline Del MD, have reviewed the above documentation for accuracy and completeness, and I agree with the above.

## 2019-02-05 NOTE — Telephone Encounter (Signed)
Contacted pt to verify webex for pre reg

## 2019-02-06 ENCOUNTER — Inpatient Hospital Stay: Payer: Managed Care, Other (non HMO) | Attending: Oncology | Admitting: Oncology

## 2019-02-06 DIAGNOSIS — D75839 Thrombocytosis, unspecified: Secondary | ICD-10-CM

## 2019-02-06 DIAGNOSIS — Z79810 Long term (current) use of selective estrogen receptor modulators (SERMs): Secondary | ICD-10-CM

## 2019-02-06 DIAGNOSIS — D0511 Intraductal carcinoma in situ of right breast: Secondary | ICD-10-CM

## 2019-02-06 DIAGNOSIS — D473 Essential (hemorrhagic) thrombocythemia: Secondary | ICD-10-CM

## 2019-02-06 DIAGNOSIS — Z1502 Genetic susceptibility to malignant neoplasm of ovary: Secondary | ICD-10-CM | POA: Insufficient documentation

## 2019-02-06 DIAGNOSIS — Z1507 Genetic susceptibility to malignant neoplasm of breast: Secondary | ICD-10-CM | POA: Insufficient documentation

## 2019-02-06 DIAGNOSIS — Z9013 Acquired absence of bilateral breasts and nipples: Secondary | ICD-10-CM

## 2019-02-06 DIAGNOSIS — C50919 Malignant neoplasm of unspecified site of unspecified female breast: Secondary | ICD-10-CM | POA: Insufficient documentation

## 2019-02-06 DIAGNOSIS — Z1501 Genetic susceptibility to malignant neoplasm of breast: Secondary | ICD-10-CM

## 2019-02-06 DIAGNOSIS — C50911 Malignant neoplasm of unspecified site of right female breast: Secondary | ICD-10-CM

## 2019-02-09 ENCOUNTER — Telehealth: Payer: Self-pay | Admitting: Oncology

## 2019-02-09 ENCOUNTER — Encounter: Payer: Self-pay | Admitting: *Deleted

## 2019-02-09 NOTE — Telephone Encounter (Signed)
Called regarding schedule °

## 2019-02-10 ENCOUNTER — Telehealth: Payer: Self-pay | Admitting: Adult Health

## 2019-02-10 NOTE — Telephone Encounter (Signed)
Scheduled appt per sch msg. Mailed printout  °

## 2019-02-17 ENCOUNTER — Encounter: Payer: Self-pay | Admitting: Physical Therapy

## 2019-02-17 ENCOUNTER — Other Ambulatory Visit: Payer: Self-pay

## 2019-02-17 ENCOUNTER — Ambulatory Visit: Payer: Managed Care, Other (non HMO) | Attending: Plastic Surgery | Admitting: Physical Therapy

## 2019-02-17 DIAGNOSIS — M25611 Stiffness of right shoulder, not elsewhere classified: Secondary | ICD-10-CM | POA: Diagnosis not present

## 2019-02-17 DIAGNOSIS — R293 Abnormal posture: Secondary | ICD-10-CM | POA: Diagnosis present

## 2019-02-17 DIAGNOSIS — R600 Localized edema: Secondary | ICD-10-CM | POA: Insufficient documentation

## 2019-02-17 DIAGNOSIS — M25612 Stiffness of left shoulder, not elsewhere classified: Secondary | ICD-10-CM

## 2019-02-17 DIAGNOSIS — M25511 Pain in right shoulder: Secondary | ICD-10-CM | POA: Diagnosis present

## 2019-02-17 DIAGNOSIS — M6281 Muscle weakness (generalized): Secondary | ICD-10-CM | POA: Diagnosis present

## 2019-02-17 NOTE — Therapy (Signed)
[place], [place], [address] Phone: [phone]   Fax:  [phone]  Physical Therapy Evaluation  Patient Details  Name: [name] MRN: [id number] Date of Birth: [birth year] Referring Provider (PT): Thimmappa   Encounter Date: 02/17/2019  PT End of Session - 02/17/19 1219    Visit Number  1    Number of Visits  9    Date for PT Re-Evaluation  03/17/19    PT Start Time  2449    PT Stop Time  1214    PT Time Calculation (min)  43 min    Activity Tolerance  Patient tolerated treatment well    Behavior During Therapy  [facility] for tasks assessed/performed       Past Medical History:  Diagnosis Date  . Achalasia    S/p Heller myotomy 1991 and numerous dilations  . 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 Family history of breast cancer   . GERD (gastroesophageal reflux disease)    Hx - None since 2013 Heller myotomy surgery  . Headache    migraines  . Hypertension   . Pleural effusion 2013   with Heller myotomy surgery    Past Surgical History:  Procedure Laterality Date  . ACHALASIA SURGERY  1991   transthoracic Heller myotomy in [place]  . BILATERAL SALPINGECTOMY Bilateral 12/02/2013   Procedure: BILATERAL SALPINGECTOMY;  Surgeon: Princess Bruins, MD;  Location: [facility];  Service: Gynecology;  Laterality: Bilateral;  . BREAST RECONSTRUCTION WITH PLACEMENT OF TISSUE EXPANDER AND FLEX HD (ACELLULAR HYDRATED DERMIS) Bilateral 01/23/2019   Procedure: BREAST RECONSTRUCTION WITH PLACEMENT OF TISSUE EXPANDER AND FLEX HD (ACELLULAR HYDRATED DERMIS);  Surgeon: Irene Limbo, MD;  Location: [place];  Service: Plastics;  Laterality: Bilateral;  . CESAREAN SECTION     x2  . ESOPHAGOGASTRODUODENOSCOPY  10/08/2011   Procedure: ESOPHAGOGASTRODUODENOSCOPY (EGD);  Surgeon: Jeryl Columbia, MD;  Location: [facility] ENDOSCOPY;  Service: Endoscopy;  Laterality: N/A;  . ESOPHAGOGASTRODUODENOSCOPY   10/16/2011   Procedure: ESOPHAGOGASTRODUODENOSCOPY (EGD);  Surgeon: Cleotis Nipper, MD;  Location: [facility];  Service: Endoscopy;  Laterality: N/A;  . ESOPHAGOGASTRODUODENOSCOPY  11/06/2011   Procedure: ESOPHAGOGASTRODUODENOSCOPY (EGD);  Surgeon: Lear Ng, MD;  Location: [facility];  Service: Endoscopy;  Laterality: N/A;  . FOREIGN BODY REMOVAL  10/08/2011   Procedure: FOREIGN BODY REMOVAL;  Surgeon: Jeryl Columbia, MD;  Location: [facility] ENDOSCOPY;  Service: Endoscopy;  Laterality: N/A;  botox  needed /ja/magod  . FOREIGN BODY REMOVAL  10/16/2011   Procedure: FOREIGN BODY REMOVAL;  Surgeon: Cleotis Nipper, MD;  Location: [facility]OSCOPY;  Service: Endoscopy;  Laterality: N/A;  . HELLER MYOTOMY  11/07/2011   Procedure: LAPAROSCOPIC HELLER MYOTOMY;  Surgeon: Pedro Earls, MD;  Location: [facility];  Service: General;  Laterality: N/A;  Laparoscopic Redo Heller Myotomy   . MASTECTOMY W/ SENTINEL NODE BIOPSY Bilateral 01/23/2019   Procedure: RIGHT MASTECTOMY WITH RIGHT AXILLARY SENTINEL LYMPH NODE BIOPSY AND LEFT RISK REDUCING MASTECTOMY;  Surgeon: Rolm Bookbinder, MD;  Location: [place];  Service: General;  Laterality: Bilateral;  . ROBOTIC ASSISTED TOTAL HYSTERECTOMY N/A 12/02/2013   Procedure: ROBOTIC ASSISTED TOTAL HYSTERECTOMY;  Surgeon: Princess Bruins, MD;  Location: [facility];  Service: Gynecology;  Laterality: N/A;  . TONSILLECTOMY  1995    There were no vitals filed for this visit.   Subjective Assessment - 02/17/19 1133    Subjective  I am having trouble with my right  arm. It feels swollen underneath my arm. I am having difficulty reaching my back to wash it. The other side moves better but it is still limited.     Pertinent History  R breast cancer post mastectomy on 01/23/19, SLNB 4 nodes all negative, BRCA 2, drains removed 02/04/19, expanders placed, esophagus repair 2017    Patient Stated Goals  get back to normal, get ROM back    Currently in Pain?  No/denies          [facility] PT Assessment - 02/17/19 0001      Assessment   Medical Diagnosis  R breast cancer    Referring Provider (PT)  Thimmappa    Onset Date/Surgical Date  01/23/19    Hand Dominance  Right    Prior Therapy  none      Precautions   Precautions  Other (comment)    Precaution Comments  at risk for lymphedema      Restrictions   Weight Bearing Restrictions  No      Balance Screen   Has the patient fallen in the past 6 months  No    Has the patient had a decrease in activity level because of a fear of falling?   No    Is the patient reluctant to leave their home because of a fear of falling?   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    Leisure  pt is not currently exercising      Cognition   Overall Cognitive Status  Within Functional Limits for tasks assessed      Observation/Other Assessments   Observations  right lateral trunk lymphedema      Posture/Postural Control   Posture/Postural Control  Postural limitations    Postural Limitations  Rounded Shoulders;Forward head      ROM / Strength   AROM / PROM / Strength  AROM      AROM   AROM Assessment Site  Shoulder    Right/Left Shoulder  Right;Left    Right Shoulder Flexion  140 Degrees    Right Shoulder ABduction  140 Degrees    Right Shoulder Internal Rotation  50 Degrees    Right Shoulder External Rotation  82 Degrees    Left Shoulder Flexion  157 Degrees    Left Shoulder ABduction  131 Degrees    Left Shoulder Internal Rotation  66 Degrees    Left Shoulder External Rotation  75 Degrees        LYMPHEDEMA/ONCOLOGY QUESTIONNAIRE - 02/17/19 1157       Type   Cancer Type  right breast cancer      Surgeries   Mastectomy Date  01/23/19    Sentinel Lymph Node Biopsy Date  01/23/19    Number Lymph Nodes Removed  4   all negative     Date Lymphedema/Swelling Started   Date  01/23/19      Treatment   Active Chemotherapy Treatment  No    Past Chemotherapy Treatment  No    Active Radiation  Treatment  No    Past Radiation Treatment  No    Current Hormone Treatment  No    Past Hormone Therapy  No      What other symptoms do you have   Are you Having Heaviness or Tightness  Yes    Are you having Pain  Yes    Are you having pitting edema  No    Is it Hard or Difficult finding clothes that fit  No    Do you have infections  No    Is there Decreased scar mobility  No      Lymphedema Assessments   Lymphedema Assessments  Upper extremities      Right Upper Extremity Lymphedema   15 cm Proximal to Olecranon Process  40 cm    Olecranon Process  31.5 cm    15 cm Proximal to Ulnar Styloid Process  32.5 cm    Just Proximal to Ulnar Styloid Process  19 cm    Across Hand at [employer]  22.7 cm    At [place] of 2nd Digit  7.5 cm      Left Upper Extremity Lymphedema   15 cm Proximal to Olecranon Process  39 cm    Olecranon Process  31.5 cm    15 cm Proximal to Ulnar Styloid Process  31.1 cm    Just Proximal to Ulnar Styloid Process  19.5 cm    Across Hand at [employer]  22 cm    At [place] of 2nd Digit  7 cm          Quick Dash - 02/17/19 0001    Open a tight or new jar  No difficulty    Do heavy household chores (wash walls, wash floors)  No difficulty    Carry a shopping bag or briefcase  Mild difficulty    Wash your back  Moderate difficulty    Use a knife to cut food  No difficulty    Recreational activities in which you take some force or impact through your arm, shoulder, or hand (golf, hammering, tennis)  Mild difficulty    During the past week, to what extent has your arm, shoulder or hand problem interfered with  your normal social activities with family, friends, neighbors, or groups?  Slightly    During the past week, to what extent has your arm, shoulder or hand problem limited your work or other regular daily activities  Quite a bit    Arm, shoulder, or hand pain.  Mild    Tingling (pins and needles) in your arm, shoulder, or hand  Severe    Difficulty Sleeping  Severe difficulty    DASH Score  34.09 %        Objective measurements completed on examination: See above findings.      [facility] Adult PT Treatment/Exercise - 02/17/19 0001      Exercises   Exercises  Shoulder      Shoulder Exercises: Supine   Flexion  AAROM;Both;5 reps   with 10 sec holds pt returned therapist demo   ABduction  AAROM;Both;5 reps   with 10 sec holds, pt returned therapist demo     Manual Therapy   Manual Therapy  Edema management    Edema Management  created foam chip pack for pt to wear in her bra             PT Education - 02/17/19 1217    Education Details  anatomy and physiology  of the lymphatic system, lymphedema risk reduction practices, supine cane exercises, wearing chip pack    Person(s) Educated  Patient    Methods  Explanation;Handout    Comprehension  Verbalized understanding          PT Long Term Goals - 02/17/19 1230      PT LONG TERM GOAL #1   Title  Pt will demonstrate 160 degrees of bilateral shoulder flexion to allow her to reach overhead.    Baseline  R 140 L 157    Time  4    Period  Weeks    Status  New    Target Date  03/17/19      PT LONG TERM GOAL #2   Title  Pt will demonstrate 160 degrees of bilateral shoulder abduction to allow her to reach out to the side    Baseline  R 140 L 131    Time  4    Period  Weeks    Status  New    Target Date  03/17/19      PT LONG TERM GOAL #3   Title  Pt will demonstrate 65 degrees of right shoulder internal rotation to allow her to wash her back    Baseline  50    Time  4    Period  Weeks    Status  New    Target Date   03/17/19      PT LONG TERM GOAL #4   Title  Pt will be indepdent with a home exercise program for continued strengthening and stretching    Time  4    Period  Weeks    Status  New    Target Date  03/17/19      PT LONG TERM GOAL #5   Title  Pt will be independent in self MLD to R lateral trunk to allow improved comfort    Time  4    Period  Weeks    Status  New    Target Date  03/17/19             Plan - 02/17/19 1223    Clinical Impression Statement  Pt presents to PT with decreased bilateral shoulder ROM following a bilateral matectomy for treatment of R breast cancer on 01/23/19, She also had a SLNB on the right. She also presents with localized edema in right trunk that has been present post surgery. Pt would benefit from skilled PT servies to improve bilateral shoulder ROM, decrease right lateral trunk swelling and instruct pt in a home exercise program for continued strengthening and stretching.     Personal Factors and Comorbidities  Fitness   pt does not currently exercise   Examination-Activity Limitations  Bathing;Hygiene/Grooming;Reach Overhead;Carry    Examination-Participation Restrictions  Laundry;Cleaning;Meal Prep    Stability/Clinical Decision Making  Evolving/Moderate complexity    Clinical Decision Making  Moderate    Rehab Potential  Excellent    PT Frequency  2x / week    PT Duration  4 weeks    PT Treatment/Interventions  ADLs/Self Care Home Management;Therapeutic exercise;Patient/family education;Therapeutic activities;Manual techniques;Passive range of motion;Manual lymph drainage;Scar mobilization;Vasopneumatic Device;Joint Manipulations    PT Next Visit Plan  begin bilateral shoulder PROM, begin MLD and instruct pt in self MLD for R lateral trunk     PT Home Exercise Plan  supine cane exercises    Consulted and Agree with Plan of Care  Patient       Patient will benefit from skilled  therapeutic intervention in order to improve the following deficits  and impairments:  Increased fascial restricitons, Pain, Postural dysfunction, Decreased range of motion, Decreased strength, Impaired UE functional use, Increased edema, Decreased knowledge of precautions  Visit Diagnosis: Stiffness of right shoulder, not elsewhere classified  Stiffness of left shoulder, not elsewhere classified  Acute pain of right shoulder  Localized edema  Muscle weakness (generalized)  Abnormal posture     Problem List Patient Active Problem List   Diagnosis Date Noted  . Breast cancer, BRCA2 positive ([place]) 02/06/2019  . BRCA2 positive 12/24/2018  . Genetic testing 12/22/2018  . Family history of breast cancer   . Family history of prostate cancer   . Ductal carcinoma in situ (DCIS) of right breast 12/08/2018  . Postoperative state 12/02/2013  . Chest pain 11/29/2011  . D-dimer, elevated 10/30/2011  . Aspiration pneumonia ([place]) 10/28/2011  . Achalasia 10/18/2011  . Leukocytosis 10/18/2011  . Thrombocytosis ([place]) 10/18/2011  . Pericardial effusion 10/18/2011  . Pleural effusion, left 10/18/2011  . Microcytic anemia 10/18/2011  . Tachycardia 10/18/2011  . SIRS (systemic inflammatory response syndrome) ([place]) 10/18/2011    [facility] 02/17/2019, 12:34 PM  [place], [place], [address] Phone: [phone]   Fax:  [phone]  Name: [name] MRN: [id number] Date of Birth: [birth year]  Manus Gunning, PT 02/17/19 12:34 PM

## 2019-02-17 NOTE — Patient Instructions (Signed)
Shoulder: Flexion (Supine)    With hands shoulder width apart, slowly lower dowel to floor behind head. Do not let elbows bend. Keep back flat. Hold _5-15___ seconds. Repeat _10___ times. Do _2___ sessions per day. CAUTION: Stretch slowly and gently.  Copyright  VHI. All rights reserved.  Shoulder: Abduction (Supine)    With right arm flat on floor, hold dowel in palm. Slowly move arm up to side of head by pushing with opposite arm. Do not let elbow bend.  Hold _5-15___ seconds. Repeat _10___ times. Do _2___ sessions per day. Repeat on opposite side.  CAUTION: Stretch slowly and gently.  Copyright  VHI. All rights reserved.   

## 2019-02-18 ENCOUNTER — Other Ambulatory Visit: Payer: Self-pay

## 2019-02-18 ENCOUNTER — Ambulatory Visit: Payer: Managed Care, Other (non HMO) | Admitting: Rehabilitation

## 2019-02-18 ENCOUNTER — Encounter: Payer: Self-pay | Admitting: Rehabilitation

## 2019-02-18 DIAGNOSIS — R293 Abnormal posture: Secondary | ICD-10-CM

## 2019-02-18 DIAGNOSIS — M25611 Stiffness of right shoulder, not elsewhere classified: Secondary | ICD-10-CM | POA: Diagnosis not present

## 2019-02-18 DIAGNOSIS — M25511 Pain in right shoulder: Secondary | ICD-10-CM

## 2019-02-18 DIAGNOSIS — R600 Localized edema: Secondary | ICD-10-CM

## 2019-02-18 DIAGNOSIS — M25612 Stiffness of left shoulder, not elsewhere classified: Secondary | ICD-10-CM

## 2019-02-18 DIAGNOSIS — M6281 Muscle weakness (generalized): Secondary | ICD-10-CM

## 2019-02-18 NOTE — Patient Instructions (Signed)
Self manual lymph drainage: Perform this sequence once a day.  Only give enough pressure no your skin to make the skin move.  Diaphragmatic - Supine   Inhale through nose making navel move out toward hands. Exhale through puckered lips, hands follow navel in. Repeat _5__ times. Rest _10__ seconds between repeats.   Copyright  VHI. All rights reserved.  Hug yourself.  Do circles at your neck just above your collarbones.  Repeat this 10 times.  Axilla - One at a Time   Using full weight of flat hand and fingers at center of uninvolved armpit, make _10__ in-place circles.   Copyright  VHI. All rights reserved.  LEG: Inguinal Nodes Stimulation   With small finger side of hand against hip crease on involved side, gently perform circles at the crease. Repeat __10_ times.   Copyright  VHI. All rights reserved.  1) Axilla to Inguinal Nodes - Sweep   On involved side, sweep _4__ times from armpit along side of trunk to hip crease.  Now gently stretch skin from the involved side to the uninvolved side across the chest at the shoulder line.  Repeat that 4 times.         Finish by doing the pathways as described above going from your involved armpit to the same side groin and going across your upper chest from the involved shoulder to the uninvolved shoulder.  Repeat the steps above where you do circles in your left groin and right armpit. Copyright  VHI. All rights reserved.

## 2019-02-18 NOTE — Therapy (Signed)
[place], [place], [address] Phone: [phone]   Fax:  [phone]  Physical Therapy Treatment  Patient Details  Name: [name] MRN: [id number] Date of Birth: 1970/11/30 Referring Provider (PT): Thimmappa   Encounter Date: 02/18/2019  PT End of Session - 02/18/19 1357    Visit Number  2    Number of Visits  9    Date for PT Re-Evaluation  03/17/19    PT Start Time  1400    PT Stop Time  1446    PT Time Calculation (min)  46 min    Activity Tolerance  Patient tolerated treatment well    Behavior During Therapy  [facility] for tasks assessed/performed       Past Medical History:  Diagnosis Date  . Achalasia    S/p Heller myotomy 1991 and numerous dilations  . Breast cancer ([place]) 12/01/2018   right, DCIS  . Family history of breast cancer   . GERD (gastroesophageal reflux disease)    Hx - None since 2013 Heller myotomy surgery  . Headache    migraines  . Hypertension   . Pleural effusion 2013   with Heller myotomy surgery    Past Surgical History:  Procedure Laterality Date  . ACHALASIA SURGERY  1991   transthoracic Heller myotomy in [place]  . BILATERAL SALPINGECTOMY Bilateral 12/02/2013   Procedure: BILATERAL SALPINGECTOMY;  Surgeon: Princess Bruins, MD;  Location: [facility];  Service: Gynecology;  Laterality: Bilateral;  . BREAST RECONSTRUCTION WITH PLACEMENT OF TISSUE EXPANDER AND FLEX HD (ACELLULAR HYDRATED DERMIS) Bilateral 01/23/2019   Procedure: BREAST RECONSTRUCTION WITH PLACEMENT OF TISSUE EXPANDER AND FLEX HD (ACELLULAR HYDRATED DERMIS);  Surgeon: Irene Limbo, MD;  Location: [place];  Service: Plastics;  Laterality: Bilateral;  . CESAREAN SECTION     x2  . ESOPHAGOGASTRODUODENOSCOPY  10/08/2011   Procedure: ESOPHAGOGASTRODUODENOSCOPY (EGD);  Surgeon: Jeryl Columbia, MD;  Location: [facility] END[facility]COPY;  Service: Endoscopy;  Laterality: N/A;  . ESOPHAGOGASTRODUODENOSCOPY   10/16/2011   Procedure: ESOPHAGOGASTRODUODENOSCOPY (EGD);  Surgeon: Cleotis Nipper, MD;  Location: [facility];  Service: Endoscopy;  Laterality: N/A;  . ESOPHAGOGASTRODUODENOSCOPY  11/06/2011   Procedure: ESOPHAGOGASTRODUODENOSCOPY (EGD);  Surgeon: Lear Ng, MD;  Location: [facility];  Service: Endoscopy;  Laterality: N/A;  . FOREIGN BODY REMOVAL  10/08/2011   Procedure: FOREIGN BODY REMOVAL;  Surgeon: Jeryl Columbia, MD;  Location: [facility] ENDOSCOPY;  Service: Endoscopy;  Laterality: N/A;  botox  needed /ja/magod  . FOREIGN BODY REMOVAL  10/16/2011   Procedure: FOREIGN BODY REMOVAL;  Surgeon: Cleotis Nipper, MD;  Location: [facility];  Service: Endoscopy;  Laterality: N/A;  . HELLER MYOTOMY  11/07/2011   Procedure: LAPAROSCOPIC HELLER MYOTOMY;  Surgeon: Pedro Earls, MD;  Location: [facility];  Service: General;  Laterality: N/A;  Laparoscopic Redo Heller Myotomy   . MASTECTOMY W/ SENTINEL NODE BIOPSY Bilateral 01/23/2019   Procedure: RIGHT MASTECTOMY WITH RIGHT AXILLARY SENTINEL LYMPH NODE BIOPSY AND LEFT RISK REDUCING MASTECTOMY;  Surgeon: Rolm Bookbinder, MD;  Location: [place];  Service: General;  Laterality: Bilateral;  . ROBOTIC ASSISTED TOTAL HYSTERECTOMY N/A 12/02/2013   Procedure: ROBOTIC ASSISTED TOTAL HYSTERECTOMY;  Surgeon: Princess Bruins, MD;  Location: [facility];  Service: Gynecology;  Laterality: N/A;  . TONSILLECTOMY  1995    There were no vitals filed for this visit.  Subjective Assessment - 02/18/19 1357    Subjective  The same.  The foam is comfortable.  Pertinent History  R breast cancer post bil mastectomy on 01/23/19, SLNB 4 nodes all negative, BRCA 2, drains removed 02/04/19, expanders placed, esophagus repair 2017    Patient Stated Goals  get back to normal, get ROM back    Currently in Pain?  No/denies   only at night                      [facility] Adult PT Treatment/Exercise - 02/18/19 0001      Manual Therapy   Manual  Therapy  Passive ROM;Manual Lymphatic Drainage (MLD)    Manual Lymphatic Drainage (MLD)  performed and educated pt on short neck, abdominal breathing x 5, Rt axillary nodes, Rt inguinal nodes, Rt axilloinguinal anatasmosis, work at the lateral chest and then repeating steps.  Handout given.  then PT performed in sidelying posterior interaxillary pathway with work on lateral trunk    Passive ROM  to bilateral shoulders monitoring incisions without pull on either side.  All directions to tolerance                  PT Long Term Goals - 02/17/19 1230      PT LONG TERM GOAL #1   Title  Pt will demonstrate 160 degrees of bilateral shoulder flexion to allow her to reach overhead.    Baseline  R 140 L 157    Time  4    Period  Weeks    Status  New    Target Date  03/17/19      PT LONG TERM GOAL #2   Title  Pt will demonstrate 160 degrees of bilateral shoulder abduction to allow her to reach out to the side    Baseline  R 140 L 131    Time  4    Period  Weeks    Status  New    Target Date  03/17/19      PT LONG TERM GOAL #3   Title  Pt will demonstrate 65 degrees of right shoulder internal rotation to allow her to wash her back    Baseline  50    Time  4    Period  Weeks    Status  New    Target Date  03/17/19      PT LONG TERM GOAL #4   Title  Pt will be indepdent with a home exercise program for continued strengthening and stretching    Time  4    Period  Weeks    Status  New    Target Date  03/17/19      PT LONG TERM GOAL #5   Title  Pt will be independent in self MLD to R lateral trunk to allow improved comfort    Time  4    Period  Weeks    Status  New    Target Date  03/17/19            Plan - 02/18/19 1447    Clinical Impression Statement  Pt tolerated PROM bilaterally well with bil shoulders approaching full by the end of treatment.  Some pull into the breast with end range flexion and ER bilaterally eliminated with treatment.  swelling on Rt lateral  trunk vs expander tightness/placement but performed MLD to this area with educated for patient.      PT Next Visit Plan  check MLD technique, continue bil PROM, start standing dowel/pulleys, etc.     Consulted and Agree with Plan of Care  Patient       Patient will benefit from skilled therapeutic intervention in order to improve the following deficits and impairments:  Increased fascial restricitons, Pain, Postural dysfunction, Decreased range of motion, Decreased strength, Impaired UE functional use, Increased edema, Decreased knowledge of precautions  Visit Diagnosis: Stiffness of right shoulder, not elsewhere classified  Stiffness of left shoulder, not elsewhere classified  Acute pain of right shoulder  Localized edema  Muscle weakness (generalized)  Abnormal posture     Problem List Patient Active Problem List   Diagnosis Date Noted  . Breast cancer, BRCA2 positive ([place]) 02/06/2019  . BRCA2 positive 12/24/2018  . Genetic testing 12/22/2018  . Family history of breast cancer   . Family history of prostate cancer   . Ductal carcinoma in situ (DCIS) of right breast 12/08/2018  . Postoperative state 12/02/2013  . Chest pain 11/29/2011  . D-dimer, elevated 10/30/2011  . Aspiration pneumonia ([place]) 10/28/2011  . Achalasia 10/18/2011  . Leukocytosis 10/18/2011  . Thrombocytosis ([place]) 10/18/2011  . Pericardial effusion 10/18/2011  . Pleural effusion, left 10/18/2011  . Microcytic anemia 10/18/2011  . Tachycardia 10/18/2011  . SIRS (systemic inflammatory response syndrome) ([place]) 10/18/2011    Shan Levans, PT 02/18/2019, 2:49 PM  [place], [place], [address] Phone: [phone]   Fax:  [phone]  Name: JHERI MITTER MRN: [id number] Date of Birth: 23-May-1971

## 2019-02-19 ENCOUNTER — Telehealth: Payer: Self-pay | Admitting: *Deleted

## 2019-02-19 NOTE — Telephone Encounter (Signed)
Patient called regarding her referral, patient scheduled for an appt on 7/2

## 2019-02-25 ENCOUNTER — Ambulatory Visit: Payer: Managed Care, Other (non HMO) | Admitting: Rehabilitation

## 2019-02-25 ENCOUNTER — Other Ambulatory Visit: Payer: Self-pay

## 2019-02-25 ENCOUNTER — Encounter: Payer: Self-pay | Admitting: Rehabilitation

## 2019-02-25 DIAGNOSIS — M25611 Stiffness of right shoulder, not elsewhere classified: Secondary | ICD-10-CM

## 2019-02-25 DIAGNOSIS — M6281 Muscle weakness (generalized): Secondary | ICD-10-CM

## 2019-02-25 DIAGNOSIS — R600 Localized edema: Secondary | ICD-10-CM

## 2019-02-25 DIAGNOSIS — M25612 Stiffness of left shoulder, not elsewhere classified: Secondary | ICD-10-CM

## 2019-02-25 DIAGNOSIS — M25511 Pain in right shoulder: Secondary | ICD-10-CM

## 2019-02-25 DIAGNOSIS — R293 Abnormal posture: Secondary | ICD-10-CM

## 2019-02-25 NOTE — Therapy (Signed)
[place], [place], [address] Phone: [phone]   Fax:  [phone]  Physical Therapy Treatment  Patient Details  Name: [name] MRN: [id number] Date of Birth: Apr 16, 1971 Referring Provider (PT): Thimmappa   Encounter Date: 02/25/2019  PT End of Session - 02/25/19 1327    Visit Number  3    Number of Visits  9    Date for PT Re-Evaluation  03/17/19    PT Start Time  1304    PT Stop Time  1343    PT Time Calculation (min)  39 min    Activity Tolerance  Patient tolerated treatment well    Behavior During Therapy  [facility] for tasks assessed/performed       Past Medical History:  Diagnosis Date  . Achalasia    S/p Heller myotomy 1991 and numerous dilations  . Breast cancer ([place]) 12/01/2018   right, DCIS  . Family history of breast cancer   . GERD (gastroesophageal reflux disease)    Hx - None since 2013 Heller myotomy surgery  . Headache    migraines  . Hypertension   . Pleural effusion 2013   with Heller myotomy surgery    Past Surgical History:  Procedure Laterality Date  . ACHALASIA SURGERY  1991   transthoracic Heller myotomy in [place]  . BILATERAL SALPINGECTOMY Bilateral 12/02/2013   Procedure: BILATERAL SALPINGECTOMY;  Surgeon: Princess Bruins, MD;  Location: [facility];  Service: Gynecology;  Laterality: Bilateral;  . BREAST RECONSTRUCTION WITH PLACEMENT OF TISSUE EXPANDER AND FLEX HD (ACELLULAR HYDRATED DERMIS) Bilateral 01/23/2019   Procedure: BREAST RECONSTRUCTION WITH PLACEMENT OF TISSUE EXPANDER AND FLEX HD (ACELLULAR HYDRATED DERMIS);  Surgeon: Irene Limbo, MD;  Location: [facility];  Service: Plastics;  Laterality: Bilateral;  . CESAREAN SECTION     x2  . ESOPHAGOGASTRODUODENOSCOPY  10/08/2011   Procedure: ESOPHAGOGASTRODUODENOSCOPY (EGD);  Surgeon: Jeryl Columbia, MD;  Location: [facility] ENDOSCOPY;  Service: Endoscopy;  Laterality: N/A;  . ESOPHAGOGASTRODUODENOSCOPY   10/16/2011   Procedure: ESOPHAGOGASTRODUODENOSCOPY (EGD);  Surgeon: Cleotis Nipper, MD;  Location: [facility];  Service: Endoscopy;  Laterality: N/A;  . ESOPHAGOGASTRODUODENOSCOPY  11/06/2011   Procedure: ESOPHAGOGASTRODUODENOSCOPY (EGD);  Surgeon: Lear Ng, MD;  Location: [facility];  Service: Endoscopy;  Laterality: N/A;  . FOREIGN BODY REMOVAL  10/08/2011   Procedure: FOREIGN BODY REMOVAL;  Surgeon: Jeryl Columbia, MD;  Location: [facility] ENDOSCOPY;  Service: Endoscopy;  Laterality: N/A;  botox  needed /ja/magod  . FOREIGN BODY REMOVAL  10/16/2011   Procedure: FOREIGN BODY REMOVAL;  Surgeon: Cleotis Nipper, MD;  Location: [facility] ENDOSCOPY;  Service: Endoscopy;  Laterality: N/A;  . HELLER MYOTOMY  11/07/2011   Procedure: LAPAROSCOPIC HELLER MYOTOMY;  Surgeon: Pedro Earls, MD;  Location: [facility];  Service: General;  Laterality: N/A;  Laparoscopic Redo Heller Myotomy   . MASTECTOMY W/ SENTINEL NODE BIOPSY Bilateral 01/23/2019   Procedure: RIGHT MASTECTOMY WITH RIGHT AXILLARY SENTINEL LYMPH NODE BIOPSY AND LEFT RISK REDUCING MASTECTOMY;  Surgeon: Rolm Bookbinder, MD;  Location: [place];  Service: General;  Laterality: Bilateral;  . ROBOTIC ASSISTED TOTAL HYSTERECTOMY N/A 12/02/2013   Procedure: ROBOTIC ASSISTED TOTAL HYSTERECTOMY;  Surgeon: Princess Bruins, MD;  Location: [facility];  Service: Gynecology;  Laterality: N/A;  . TONSILLECTOMY  1995    There were no vitals filed for this visit.  Subjective Assessment - 02/25/19 1304    Subjective  I'm doing alright.  I got my husand  to do my massage.  I saw Dr. Iran Planas and she said she wasn't sure if it was swelling but probably just extra skin. August 14th will be the impant day    Pertinent History  R breast cancer post bil mastectomy on 01/23/19, SLNB 4 nodes all negative, BRCA 2, drains removed 02/04/19, expanders placed, esophagus repair 2017    Patient Stated Goals  get back to normal, get ROM back    Currently in Pain?   No/denies                       [facility] Adult PT Treatment/Exercise - 02/25/19 0001      Shoulder Exercises: Standing   Protraction  10 reps;Both    Protraction Limitations  pro/ret at the wall with instruction and cueing      Shoulder Exercises: Pulleys   Flexion  2 minutes    Flexion Limitations  with instruction    ABduction  2 minutes    ABduction Limitations  with instruction      Shoulder Exercises: Therapy Ball   Flexion  Both;10 reps      Manual Therapy   Manual Lymphatic Drainage (MLD)  pt doing well with MLD independently with husband and no swelling evident today    Passive ROM  to bilateral shoulders              PT Education - 02/25/19 1345    Education Details  updated HEP    Person(s) Educated  Patient    Methods  Explanation;Demonstration;Tactile cues;Verbal cues;Handout    Comprehension  Verbalized understanding;Returned demonstration;Verbal cues required          PT Long Term Goals - 02/17/19 1230      PT LONG TERM GOAL #1   Title  Pt will demonstrate 160 degrees of bilateral shoulder flexion to allow her to reach overhead.    Baseline  R 140 L 157    Time  4    Period  Weeks    Status  New    Target Date  03/17/19      PT LONG TERM GOAL #2   Title  Pt will demonstrate 160 degrees of bilateral shoulder abduction to allow her to reach out to the side    Baseline  R 140 L 131    Time  4    Period  Weeks    Status  New    Target Date  03/17/19      PT LONG TERM GOAL #3   Title  Pt will demonstrate 65 degrees of right shoulder internal rotation to allow her to wash her back    Baseline  50    Time  4    Period  Weeks    Status  New    Target Date  03/17/19      PT LONG TERM GOAL #4   Title  Pt will be indepdent with a home exercise program for continued strengthening and stretching    Time  4    Period  Weeks    Status  New    Target Date  03/17/19      PT LONG TERM GOAL #5   Title  Pt will be independent in self  MLD to R lateral trunk to allow improved comfort    Time  4    Period  Weeks    Status  New    Target Date  03/17/19  Plan - 02/25/19 1328    Clinical Impression Statement  Pt arrives with reports of much improved ROM overall and husband doing a good job with MLD for the Rt lateral chest.  PROM WNL bil today so transitioned to more AAROM and advanced exercises.  Most likely pt will be ind with lateral chest puffiness with foam, bra, and husband/self MLD with PT focus on ROM/strenghtening    PT Next Visit Plan  continue bil shoulder ROM working into strengthening, teach supine scap    PT Home Exercise Plan  supine cane exercises, Access Code: [email or web address]       Patient will benefit from skilled therapeutic intervention in order to improve the following deficits and impairments:     Visit Diagnosis: 1. Stiffness of right shoulder, not elsewhere classified   2. Stiffness of left shoulder, not elsewhere classified   3. Acute pain of right shoulder   4. Localized edema   5. Muscle weakness (generalized)   6. Abnormal posture        Problem List Patient Active Problem List   Diagnosis Date Noted  . Breast cancer, BRCA2 positive ([place]) 02/06/2019  . BRCA2 positive 12/24/2018  . Genetic testing 12/22/2018  . Family history of breast cancer   . Family history of prostate cancer   . Ductal carcinoma in situ (DCIS) of right breast 12/08/2018  . Postoperative state 12/02/2013  . Chest pain 11/29/2011  . D-dimer, elevated 10/30/2011  . Aspiration pneumonia ([place]) 10/28/2011  . Achalasia 10/18/2011  . Leukocytosis 10/18/2011  . Thrombocytosis ([place]) 10/18/2011  . Pericardial effusion 10/18/2011  . Pleural effusion, left 10/18/2011  . Microcytic anemia 10/18/2011  . Tachycardia 10/18/2011  . SIRS (systemic inflammatory response syndrome) ([place]) 10/18/2011    Stark Bray 02/25/2019, 1:48 PM  [place], [place], [address] Phone: [phone]   Fax:  [phone]  Name: [name] MRN: [id number] Date of Birth: 1971-05-16

## 2019-02-25 NOTE — Patient Instructions (Signed)
Access Code: [email or web address]  URL: [email or web address]  Date: 02/25/2019  Prepared by: Shan Levans   Exercises  Standing shoulder flexion wall slides - 10 reps - 1-3 sets - 10seconds hold - 1x daily - 7x weekly  Standing Shoulder Abduction Slides at Wall - 10 reps - 1-3 sets - 10 seconds hold - 1x daily - 7x weekly  Scapular Protraction at Wall - 10 reps - 1-3 sets - 1x daily - 7x weekly

## 2019-03-02 ENCOUNTER — Ambulatory Visit: Payer: Managed Care, Other (non HMO) | Admitting: Rehabilitation

## 2019-03-04 ENCOUNTER — Encounter: Payer: Self-pay | Admitting: Rehabilitation

## 2019-03-04 ENCOUNTER — Ambulatory Visit: Payer: Managed Care, Other (non HMO) | Admitting: Rehabilitation

## 2019-03-04 ENCOUNTER — Other Ambulatory Visit: Payer: Self-pay

## 2019-03-04 DIAGNOSIS — M25511 Pain in right shoulder: Secondary | ICD-10-CM

## 2019-03-04 DIAGNOSIS — M25611 Stiffness of right shoulder, not elsewhere classified: Secondary | ICD-10-CM | POA: Diagnosis not present

## 2019-03-04 DIAGNOSIS — R600 Localized edema: Secondary | ICD-10-CM

## 2019-03-04 DIAGNOSIS — M6281 Muscle weakness (generalized): Secondary | ICD-10-CM

## 2019-03-04 DIAGNOSIS — M25612 Stiffness of left shoulder, not elsewhere classified: Secondary | ICD-10-CM

## 2019-03-04 DIAGNOSIS — R293 Abnormal posture: Secondary | ICD-10-CM

## 2019-03-04 NOTE — Therapy (Signed)
[place], [place], [address] Phone: [phone]   Fax:  [phone]  Physical Therapy Treatment  Patient Details  Name: [name] MRN: [id number] Date of Birth: [birth year] Referring Provider (PT): Thimmappa   Encounter Date: 03/04/2019  PT End of Session - 03/04/19 1221    Visit Number  4    Date for PT Re-Evaluation  03/17/19    PT Start Time  1128    PT Stop Time  1203    PT Time Calculation (min)  35 min    Activity Tolerance  Patient tolerated treatment well    Behavior During Therapy  [facility] for tasks assessed/performed       Past Medical History:  Diagnosis Date  . Achalasia    S/p Heller myotomy 1991 and numerous dilations  . Breast cancer ([place]) 12/01/2018   right, DCIS  . Family history of breast cancer   . GERD (gastroesophageal reflux disease)    Hx - None since 2013 Heller myotomy surgery  . Headache    migraines  . Hypertension   . Pleural effusion 2013   with Heller myotomy surgery    Past Surgical History:  Procedure Laterality Date  . ACHALASIA SURGERY  1991   transthoracic Heller myotomy in [place]  . BILATERAL SALPINGECTOMY Bilateral 12/02/2013   Procedure: BILATERAL SALPINGECTOMY;  Surgeon: Princess Bruins, MD;  Location: [facility];  Service: Gynecology;  Laterality: Bilateral;  . BREAST RECONSTRUCTION WITH PLACEMENT OF TISSUE EXPANDER AND FLEX HD (ACELLULAR HYDRATED DERMIS) Bilateral 01/23/2019   Procedure: BREAST RECONSTRUCTION WITH PLACEMENT OF TISSUE EXPANDER AND FLEX HD (ACELLULAR HYDRATED DERMIS);  Surgeon: Irene Limbo, MD;  Location: [facility];  Service: Plastics;  Laterality: Bilateral;  . CESAREAN SECTION     x2  . ESOPHAGOGASTRODUODENOSCOPY  10/08/2011   Procedure: ESOPHAGOGASTRODUODENOSCOPY (EGD);  Surgeon: Jeryl Columbia, MD;  Location: [facility] ENDOSCOPY;  Service: Endoscopy;  Laterality: N/A;  . ESOPHAGOGASTRODUODENOSCOPY  10/16/2011   Procedure:  ESOPHAGOGASTRODUODENOSCOPY (EGD);  Surgeon: Cleotis Nipper, MD;  Location: [facility];  Service: Endoscopy;  Laterality: N/A;  . ESOPHAGOGASTRODUODENOSCOPY  11/06/2011   Procedure: ESOPHAGOGASTRODUODENOSCOPY (EGD);  Surgeon: Lear Ng, MD;  Location: [facility];  Service: Endoscopy;  Laterality: N/A;  . FOREIGN BODY REMOVAL  10/08/2011   Procedure: FOREIGN BODY REMOVAL;  Surgeon: Jeryl Columbia, MD;  Location: [facility] ENDOSCOPY;  Service: Endoscopy;  Laterality: N/A;  botox  needed /ja/magod  . FOREIGN BODY REMOVAL  10/16/2011   Procedure: FOREIGN BODY REMOVAL;  Surgeon: Cleotis Nipper, MD;  Location: [facility];  Service: Endoscopy;  Laterality: N/A;  . HELLER MYOTOMY  11/07/2011   Procedure: LAPAROSCOPIC HELLER MYOTOMY;  Surgeon: Pedro Earls, MD;  Location: [facility];  Service: General;  Laterality: N/A;  Laparoscopic Redo Heller Myotomy   . MASTECTOMY W/ SENTINEL NODE BIOPSY Bilateral 01/23/2019   Procedure: RIGHT MASTECTOMY WITH RIGHT AXILLARY SENTINEL LYMPH NODE BIOPSY AND LEFT RISK REDUCING MASTECTOMY;  Surgeon: Rolm Bookbinder, MD;  Location: [place];  Service: General;  Laterality: Bilateral;  . ROBOTIC ASSISTED TOTAL HYSTERECTOMY N/A 12/02/2013   Procedure: ROBOTIC ASSISTED TOTAL HYSTERECTOMY;  Surgeon: Princess Bruins, MD;  Location: [facility];  Service: Gynecology;  Laterality: N/A;  . TONSILLECTOMY  1995    There were no vitals filed for this visit.  Subjective Assessment - 03/04/19 1128    Subjective  Other than at night I feel fine.  I still feel some pain in the Rt  shoulder blade when I sleep (pointing to the latissimus area)    Pertinent History  R breast cancer post bil mastectomy on 01/23/19, SLNB 4 nodes all negative, BRCA 2, drains removed 02/04/19, expanders placed, esophagus repair 2017    Patient Stated Goals  get back to normal, get ROM back    Currently in Pain?  No/denies         [facility] PT Assessment - 03/04/19 0001      ROM / Strength    AROM / PROM / Strength  Strength      AROM   Right Shoulder Flexion  157 Degrees    Right Shoulder ABduction  163 Degrees    Right Shoulder Internal Rotation  75 Degrees    Right Shoulder External Rotation  85 Degrees    Left Shoulder Flexion  157 Degrees    Left Shoulder ABduction  165 Degrees    Left Shoulder Internal Rotation  75 Degrees    Left Shoulder External Rotation  85 Degrees      Strength   Overall Strength Comments  overall 4+/5                   [facility] Adult PT Treatment/Exercise - 03/04/19 0001      Shoulder Exercises: Supine   Other Supine Exercises  supine red band scapular series added to the HEP with performance x 15  with cueing for performance of each       Shoulder Exercises: Standing   Protraction  10 reps;Both    Protraction Limitations  able to perform this today      Shoulder Exercises: Pulleys   Flexion Limitations  no pull felt    ABduction  2 minutes      Shoulder Exercises: Therapy Ball   Flexion  Both;10 reps    Flexion Limitations  1# at wrist      Shoulder Exercises: Stretch   Other Shoulder Stretches  doorway stretch 3x20"    Other Shoulder Stretches  lat stretch education and given handout on the wall 2x30" and shown kneeling at a chair with this stretch hitting the painful spot at night                  PT Long Term Goals - 03/04/19 1225      PT LONG TERM GOAL #1   Title  Pt will demonstrate 160 degrees of bilateral shoulder flexion to allow her to reach overhead.    Status  Achieved      PT LONG TERM GOAL #2   Title  Pt will demonstrate 160 degrees of bilateral shoulder abduction to allow her to reach out to the side    Status  Achieved      PT LONG TERM GOAL #3   Title  Pt will demonstrate 65 degrees of right shoulder internal rotation to allow her to wash her back    Status  Achieved      PT LONG TERM GOAL #4   Title  Pt will be indepdent with a home exercise program for continued strengthening and  stretching    Status  Partially Met      PT LONG TERM GOAL #5   Title  Pt will be independent in self MLD to R lateral trunk to allow improved comfort    Status  Achieved            Plan - 03/04/19 1222    Clinical Impression Statement  Pt doing very well  today.  ROM is WNL and no pain just some Rt axillary pull with overhead flexion and abduction.  Advanced HEP to include supine scapular series and given red and green bands.  Also able to isolate protraction and retraction today without cueing.  PROM full and husand still doing MLD as able.  only pain is overnight with muscle soreness in the latissimus hopefully will be helped wiht the new stretch.  Will check in in 2 weeks to most likely teach strength ABC / final HEP    PT Next Visit Plan  teach strength ABC or finalize HEP    PT Home Exercise Plan  Access Code: [email or web address]       Patient will benefit from skilled therapeutic intervention in order to improve the following deficits and impairments:     Visit Diagnosis: 1. Stiffness of right shoulder, not elsewhere classified   2. Stiffness of left shoulder, not elsewhere classified   3. Acute pain of right shoulder   4. Localized edema   5. Muscle weakness (generalized)   6. Abnormal posture        Problem List Patient Active Problem List   Diagnosis Date Noted  . Breast cancer, BRCA2 positive ([place]) 02/06/2019  . BRCA2 positive 12/24/2018  . Genetic testing 12/22/2018  . Family history of breast cancer   . Family history of prostate cancer   . Ductal carcinoma in situ (DCIS) of right breast 12/08/2018  . Postoperative state 12/02/2013  . Chest pain 11/29/2011  . D-dimer, elevated 10/30/2011  . Aspiration pneumonia ([place]) 10/28/2011  . Achalasia 10/18/2011  . Leukocytosis 10/18/2011  . Thrombocytosis ([place]) 10/18/2011  . Pericardial effusion 10/18/2011  . Pleural effusion, left 10/18/2011  . Microcytic anemia 10/18/2011  . Tachycardia 10/18/2011  . SIRS (systemic  inflammatory response syndrome) ([facility]) 10/18/2011    Shan Levans, PT 03/04/2019, 12:25 PM  [place], [place], [address] Phone: [phone]   Fax:  [phone]  Name: BREEANN REPOSA MRN: [id number] Date of Birth: [birth year]

## 2019-03-04 NOTE — Patient Instructions (Signed)
Access Code: [email or web address]  URL: [email or web address]  Date: 03/04/2019  Prepared by: Shan Levans   Exercises  Standing shoulder flexion wall slides - 10 reps - 1 sets - 10seconds hold - 1x daily - 7x weekly  Doorway Pec Stretch at 90 Degrees Abduction - 10 reps - 1-3 sets - 20-30 seconds hold - 1x daily - 7x weekly  Standing Row with Anchored Resistance - 10 reps - 1-3 sets - 1x daily - 7x weekly  Standing Shoulder Flexion to 90 Degrees with Dumbbells - 10 reps - 1-3 sets - 1x daily - 7x weekly  Shoulder Abduction - Thumbs Up - 10 reps - 1-3 sets - 1x daily - 7x weekly  Supine Shoulder Horizontal Abduction with Resistance - 10 reps - 1-3 sets - 1x daily - 7x weekly  Supine Shoulder External Rotation with Resistance - 10 reps - 1-3 sets - 1x daily - 7x weekly  Supine PNF D2 Flexion with Resistance - 10 reps - 1-3 sets - 1x daily - 7x weekly  Hooklying Shoulder I - 10 reps - 1-3 sets - 1x daily - 7x weekly  And latissimus stretch

## 2019-03-06 ENCOUNTER — Encounter: Payer: Managed Care, Other (non HMO) | Admitting: Rehabilitation

## 2019-03-09 ENCOUNTER — Encounter: Payer: Managed Care, Other (non HMO) | Admitting: Rehabilitation

## 2019-03-11 ENCOUNTER — Inpatient Hospital Stay: Payer: Managed Care, Other (non HMO) | Attending: Oncology | Admitting: Gynecologic Oncology

## 2019-03-11 ENCOUNTER — Encounter: Payer: Managed Care, Other (non HMO) | Admitting: Rehabilitation

## 2019-03-11 ENCOUNTER — Other Ambulatory Visit: Payer: Self-pay | Admitting: Gynecologic Oncology

## 2019-03-11 ENCOUNTER — Encounter: Payer: Self-pay | Admitting: Gynecologic Oncology

## 2019-03-11 ENCOUNTER — Other Ambulatory Visit: Payer: Self-pay

## 2019-03-11 VITALS — BP 152/79 | HR 72 | Temp 99.4°F | Resp 18 | Ht 66.0 in | Wt 229.0 lb

## 2019-03-11 DIAGNOSIS — Z1501 Genetic susceptibility to malignant neoplasm of breast: Secondary | ICD-10-CM

## 2019-03-11 DIAGNOSIS — Z803 Family history of malignant neoplasm of breast: Secondary | ICD-10-CM | POA: Insufficient documentation

## 2019-03-11 DIAGNOSIS — Z170 Estrogen receptor positive status [ER+]: Secondary | ICD-10-CM | POA: Insufficient documentation

## 2019-03-11 DIAGNOSIS — Z90710 Acquired absence of both cervix and uterus: Secondary | ICD-10-CM | POA: Insufficient documentation

## 2019-03-11 DIAGNOSIS — Z6837 Body mass index (BMI) 37.0-37.9, adult: Secondary | ICD-10-CM | POA: Insufficient documentation

## 2019-03-11 DIAGNOSIS — K220 Achalasia of cardia: Secondary | ICD-10-CM | POA: Insufficient documentation

## 2019-03-11 DIAGNOSIS — K222 Esophageal obstruction: Secondary | ICD-10-CM | POA: Diagnosis not present

## 2019-03-11 DIAGNOSIS — D0511 Intraductal carcinoma in situ of right breast: Secondary | ICD-10-CM | POA: Diagnosis present

## 2019-03-11 DIAGNOSIS — Z8042 Family history of malignant neoplasm of prostate: Secondary | ICD-10-CM | POA: Insufficient documentation

## 2019-03-11 DIAGNOSIS — C50911 Malignant neoplasm of unspecified site of right female breast: Secondary | ICD-10-CM

## 2019-03-11 DIAGNOSIS — E669 Obesity, unspecified: Secondary | ICD-10-CM | POA: Insufficient documentation

## 2019-03-11 DIAGNOSIS — Z801 Family history of malignant neoplasm of trachea, bronchus and lung: Secondary | ICD-10-CM | POA: Diagnosis not present

## 2019-03-11 DIAGNOSIS — Z1502 Genetic susceptibility to malignant neoplasm of ovary: Secondary | ICD-10-CM | POA: Diagnosis not present

## 2019-03-11 DIAGNOSIS — I10 Essential (primary) hypertension: Secondary | ICD-10-CM | POA: Insufficient documentation

## 2019-03-11 DIAGNOSIS — Z148 Genetic carrier of other disease: Secondary | ICD-10-CM | POA: Diagnosis not present

## 2019-03-11 MED ORDER — OXYCODONE HCL 5 MG PO TABS: 5.0000 mg | ORAL_TABLET | ORAL | 0 refills | Status: DC | PRN

## 2019-03-11 MED ORDER — IBUPROFEN 600 MG PO TABS: 600.0000 mg | ORAL_TABLET | Freq: Four times a day (QID) | ORAL | 0 refills | Status: AC | PRN

## 2019-03-11 MED ORDER — SENNOSIDES-DOCUSATE SODIUM 8.6-50 MG PO TABS: 2.0000 | ORAL_TABLET | Freq: Every day | ORAL | 1 refills | Status: DC

## 2019-03-11 NOTE — Patient Instructions (Signed)
Preparing for your Surgery  Plan for surgery on March 26, 2019 with Dr. Janie Morning at [facility] will be scheduled for a robotic assisted bilateral salpingo-oophorectomy.   Pre-operative Testing -You will receive a phone call from presurgical testing at [facility] if you have not received a call already to arrange for a pre-operative testing appointment before your surgery.  This appointment normally occurs one to two weeks before your scheduled surgery.   -Bring your insurance card, copy of an advanced directive if applicable, medication list  -At that visit, you will be asked to sign a consent for a possible blood transfusion in case a transfusion becomes necessary during surgery.  The need for a blood transfusion is rare but having consent is a necessary part of your care.     -You should not be taking blood thinners or aspirin at least ten days prior to surgery unless instructed by your surgeon.  -Do not take supplements such as fish oil (omega 3), red yeast rice, tumeric before your surgery.  Day Before Surgery at [facility] will be asked to take in a light diet the day before surgery.  Avoid carbonated beverages.  You will be advised to have nothing to eat or drink after midnight the evening before.    Eat a light diet the day before surgery.  Examples including soups, broths, toast, yogurt, mashed potatoes.  Things to avoid include carbonated beverages (fizzy beverages), raw fruits and raw vegetables, or beans.   If your bowels are filled with gas, your surgeon will have difficulty visualizing your pelvic organs which increases your surgical risks.  Your role in recovery Your role is to become active as soon as directed by your doctor, while still giving yourself time to heal.  Rest when you feel tired. You will be asked to do the following in order to speed your recovery:  - Cough and breathe deeply. This helps toclear and expand your lungs and can prevent  pneumonia. You may be given a spirometer to practice deep breathing. A staff member will show you how to use the spirometer. - Do mild physical activity. Walking or moving your legs help your circulation and body functions return to normal. A staff member will help you when you try to walk and will provide you with simple exercises. Do not try to get up or walk alone the first time. - Actively manage your pain. Managing your pain lets you move in comfort. We will ask you to rate your pain on a scale of zero to 10. It is your responsibility to tell your doctor or nurse where and how much you hurt so your pain can be treated.  Special Considerations -If you are diabetic, you may be placed on insulin after surgery to have closer control over your blood sugars to promote healing and recovery.  This does not mean that you will be discharged on insulin.  If applicable, your oral antidiabetics will be resumed when you are tolerating a solid diet.  -Your final pathology results from surgery should be available around one week after surgery and the results will be relayed to you when available.  -Dr. Lahoma Crocker is the surgeon that assists your GYN Oncologist with surgery.  If you end up staying the night, the next day after your surgery you will either see Dr. Denman George or Dr. Lahoma Crocker.  -FMLA forms can be faxed to [phone] and please allow 5-7 business days for completion.  Pain  Management After Surgery -You have been prescribed your pain medication and bowel regimen medications before surgery so that you can have these available when you are discharged from the hospital. The pain medication is for use ONLY AFTER surgery and a new prescription will not be given.   -Make sure that you have Tylenol and Ibuprofen at home to use on a regular basis after surgery for pain control. We recommend alternating the medications every hour to six hours since they work differently and are processed in the  body differently for pain relief.  -Review the attached handout on narcotic use and their risks and side effects.   Bowel Regimen -You have been prescribed Sennakot-S to take nightly to prevent constipation especially if you are taking the narcotic pain medication intermittently.  It is important to prevent constipation and drink adequate amounts of liquids.  Blood Transfusion Information WHAT IS A BLOOD TRANSFUSION? A transfusion is the replacement of blood or some of its parts. Blood is made up of multiple cells which provide different functions.  Red blood cells carry oxygen and are used for blood loss replacement.  White blood cells fight against infection.  Platelets control bleeding.  Plasma helps clot blood.  Other blood products are available for specialized needs, such as hemophilia or other clotting disorders. BEFORE THE TRANSFUSION  Who gives blood for transfusions?   You may be able to donate blood to be used at a later date on yourself (autologous donation).  Relatives can be asked to donate blood. This is generally not any safer than if you have received blood from a stranger. The same precautions are taken to ensure safety when a relative's blood is donated.  Healthy volunteers who are fully evaluated to make sure their blood is safe. This is blood bank blood. Transfusion therapy is the safest it has ever been in the practice of medicine. Before blood is taken from a donor, a complete history is taken to make sure that person has no history of diseases nor engages in risky social behavior (examples are intravenous drug use or sexual activity with multiple partners). The donor's travel history is screened to minimize risk of transmitting infections, such as malaria. The donated blood is tested for signs of infectious diseases, such as HIV and hepatitis. The blood is then tested to be sure it is compatible with you in order to minimize the chance of a transfusion reaction. If  you or a relative donates blood, this is often done in anticipation of surgery and is not appropriate for emergency situations. It takes many days to process the donated blood. RISKS AND COMPLICATIONS Although transfusion therapy is very safe and saves many lives, the main dangers of transfusion include:   Getting an infectious disease.  Developing a transfusion reaction. This is an allergic reaction to something in the blood you were given. Every precaution is taken to prevent this. The decision to have a blood transfusion has been considered carefully by your caregiver before blood is given. Blood is not given unless the benefits outweigh the risks.  AFTER SURGERY CONSIDERATIONS 03/11/2019  Return to work: 4-6 weeks if applicable  Activity: 1. Be up and out of the bed during the day.  Take a nap if needed.  You may walk up steps but be careful and use the hand rail.  Stair climbing will tire you more than you think, you may need to stop part way and rest.   2. No lifting or straining for 6 weeks.  3. No driving for 1 week(s).  Do not drive if you are taking narcotic pain medicine.  4. Shower daily.  Use soap and water on your incision and pat dry; don't rub.  No tub baths until cleared by your surgeon.   5. No sexual activity and nothing in the vagina for 2 weeks.  6. You may experience a small amount of clear drainage from your incisions, which is normal.  If the drainage persists or increases, please call the office.  7. Take Tylenol or ibuprofen first for pain and only use Percocet for severe pain not relieved by the Tylenol or Ibuprofen.  Monitor your Tylenol intake to a max of 4,000 mg a day since Percocet has Tylenol in it as well.  Diet: 1. Low sodium Heart Healthy Diet is recommended.  2. It is safe to use a laxative, such as Miralax or Colace, if you have difficulty moving your bowels. You can take Sennakot at bedtime every evening to keep bowel movements regular and to  prevent constipation.    Wound Care: 1. Keep clean and dry.  Shower daily.  Reasons to call the Doctor:  Fever - Oral temperature greater than 100.4 degrees Fahrenheit  Foul-smelling vaginal discharge  Difficulty urinating  Nausea and vomiting  Increased pain at the site of the incision that is unrelieved with pain medicine.  Difficulty breathing with or without chest pain  New calf pain especially if only on one side  Sudden, continuing increased vaginal bleeding with or without clots.   Contacts: For questions or concerns you should contact:  Dr. Janie Morning at [facility], NP at [phone]  After Hours: call [phone] and have the GYN Oncologist paged/contacted

## 2019-03-11 NOTE — H&P (View-Only) (Signed)
Consult Note: Gyn-Onc  Consult was requested by Dr. Donne Hazel for the evaluation of [name] 48 y.o. female  CC:  Chief Complaint  Patient presents with  . 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 [name]  is a 47 y.o.  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  I discussed with [name] what the significance of BRCA2 mutations and the increased risk for ovarian cancer. I discussed that there are 2 options for proceding: 1/ surveillance with [place] and CA 125 every 6 months. I discussed that this does not prevent cancer but hopes to diagnose it at an earlier stage. This is typically reserved for women who have not completed childbearing or women who are poor surgical candidates.  2/ surgical risk reduction with BSO (typically accomplished with minimally invasive surgery).   She is favoring the 2nd option. I discussed robotic BSO and the inherent risks including  bleeding, infection, damage to internal organs (such as bladder,ureters, bowels), blood clot, reoperation and rehospitalization. I discussed that risks are higher for her given her prior surgical history and the potential for "scar tissue"/adhesive disease.  She requested first available timing given that she has breast reconstructive surgery scheduled for 04/24/19, and so we scheduled her with my partner, Dr Janie Morning.   We will evaluate the patient with a TVUS preoperatively to ensure she has no gross adnexal pathology.   HPI: [name] is a 48 year old P2 who is seen in consultation at the request of Dr Donne Hazel for a deleterious mutation in [place] in the setting of a personal history of hormone receptor positive breast cancer.  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  She underwent definitive surgery after 2-week course of preoperative tamoxifen.  The surgery was on  01/23/2019 with Dr. Donne Hazel.  It included bilateral simple mastectomy and right sentinel lymph node biopsy of the axilla.  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  The tumor was 0.5 cm.  It strongly stained for ER and PR.  She did well postoperatively.  She made a plan for reconstructive surgery in August 2020.  Given her premenopausal age she was a candidate for genetic testing.  This took place on December 10, 2018.  It confirmed a pathologic variant in BRCA2.  The patient's daughter has been subsequently tested and was positive.  The patient's personal history is significant for diagnosis of obesity (BMI 37 kg/m), hypertension, and a history of esophageal stricture and achalasia.  This was definitive treated in 1990 with thoracic surgery with a Heller myotomy.  This was revised laparoscopically with Dr. Johnathan Hausen in 8413 and an uncomplicated procedure.  She subsequently underwent a robotic hysterectomy that was uncomplicated with Dr. Dellis Filbert.  Pathology from this confirmed benign fibroids.  Patient had 2 prior cesarean section deliveries.  She takes no blood thinner medications.  She works as a Cabin crew for.  Family history significant for father with prostate cancer, a paternal grandmother with breast cancer, a brother with lung cancer.   Current Meds:  Outpatient Encounter Medications as of 03/11/2019  Medication Sig  . EPINEPHrine 0.3 mg/0.3 mL IJ SOAJ injection Inject into the muscle. As needed for shrimp allergy  . [DISCONTINUED] ibuprofen (ADVIL,MOTRIN) 800 MG tablet Take 800 mg by mouth every 8 (eight) hours  as needed for fever.  . [DISCONTINUED] lisinopril-hydrochlorothiazide (PRINZIDE,ZESTORETIC) 10-12.5 MG tablet Take 1 tablet by mouth daily.   No facility-administered encounter medications on file as of 03/11/2019.     Allergy:  Allergies  Allergen Reactions  . Lisinopril-Hydrochlorothiazide Swelling    Lips  swelling.  . Shellfish Allergy Hives    Social Hx:   Social History   Socioeconomic History  . Marital status: Married    Spouse name: [name]  . Number of children: 2  . Years of education: 41  . Highest education level: Not on file  Occupational History    Employer: [employer]  Social Needs  . Financial resource strain: Not on file  . Food insecurity    Worry: Never true    Inability: Never true  . Transportation needs    Medical: No    Non-medical: No  Tobacco Use  . Smoking status: Former Smoker    Packs/day: 0.25    Years: 0.50    Pack years: 0.12    Types: Cigarettes    Quit date: 10/07/1990    Years since quitting: 28.4  . Smokeless tobacco: Never Used  . Tobacco comment: Smoked for 2 months, nothing heavier  Substance and Sexual Activity  . Alcohol use: Yes    Alcohol/week: 1.0 standard drinks    Types: 1 Glasses of wine per week    Comment: Rarely  . Drug use: No  . Sexual activity: Yes    Birth control/protection: Surgical    Comment: hysterectomy w/ BSO on 12/02/2013  Lifestyle  . Physical activity    Days per week: Not on file    Minutes per session: Not on file  . Stress: Not on file  Relationships  . Social Herbalist on phone: Not on file    Gets together: Not on file    Attends religious service: Not on file    Active member of club or organization: Not on file    Attends meetings of clubs or organizations: Not on file    Relationship status: Not on file  . Intimate partner violence    Fear of current or ex partner: Not on file    Emotionally abused: Not on file    Physically abused: Not on file    Forced sexual activity: Not on file  Other Topics Concern  . Not on file  Social History Narrative   Lives at home with husband, working and goes to school, has 2 kids. Very minimal smoking history, very rare alcohol.    Caffeine -rarely    Past Surgical Hx:  Past Surgical History:  Procedure Laterality Date  . ACHALASIA  SURGERY  1991   transthoracic Heller myotomy in [place]  . BILATERAL SALPINGECTOMY Bilateral 12/02/2013   Procedure: BILATERAL SALPINGECTOMY;  Surgeon: Princess Bruins, MD;  Location: [facility];  Service: Gynecology;  Laterality: Bilateral;  . BREAST RECONSTRUCTION WITH PLACEMENT OF TISSUE EXPANDER AND FLEX HD (ACELLULAR HYDRATED DERMIS) Bilateral 01/23/2019   Procedure: BREAST RECONSTRUCTION WITH PLACEMENT OF TISSUE EXPANDER AND FLEX HD (ACELLULAR HYDRATED DERMIS);  Surgeon: Irene Limbo, MD;  Location: [facility];  Service: Plastics;  Laterality: Bilateral;  . CESAREAN SECTION     x2  . ESOPHAGOGASTRODUODENOSCOPY  10/08/2011   Procedure: ESOPHAGOGASTRODUODENOSCOPY (EGD);  Surgeon: Jeryl Columbia, MD;  Location: [facility] ENDOSCOPY;  Service: Endoscopy;  Laterality: N/A;  . ESOPHAGOGASTRODUODENOSCOPY  10/16/2011   Procedure: ESOPHAGOGASTRODUODENOSCOPY (EGD);  Surgeon: Cleotis Nipper, MD;  Location: [facility];  Service: Endoscopy;  Laterality: N/A;  . ESOPHAGOGASTRODUODENOSCOPY  11/06/2011   Procedure: ESOPHAGOGASTRODUODENOSCOPY (EGD);  Surgeon: Lear Ng, MD;  Location: [facility];  Service: Endoscopy;  Laterality: N/A;  . FOREIGN BODY REMOVAL  10/08/2011   Procedure: FOREIGN BODY REMOVAL;  Surgeon: Jeryl Columbia, MD;  Location: [facility] ENDOSCOPY;  Service: Endoscopy;  Laterality: N/A;  botox  needed /ja/magod  . FOREIGN BODY REMOVAL  10/16/2011   Procedure: FOREIGN BODY REMOVAL;  Surgeon: Cleotis Nipper, MD;  Location: [facility]OSCOPY;  Service: Endoscopy;  Laterality: N/A;  . HELLER MYOTOMY  11/07/2011   Procedure: LAPAROSCOPIC HELLER MYOTOMY;  Surgeon: Pedro Earls, MD;  Location: [facility];  Service: General;  Laterality: N/A;  Laparoscopic Redo Heller Myotomy   . MASTECTOMY W/ SENTINEL NODE BIOPSY Bilateral 01/23/2019   Procedure: RIGHT MASTECTOMY WITH RIGHT AXILLARY SENTINEL LYMPH NODE BIOPSY AND LEFT RISK REDUCING MASTECTOMY;  Surgeon: Rolm Bookbinder, MD;  Location: [facility];  Service: General;  Laterality: Bilateral;  . ROBOTIC ASSISTED TOTAL HYSTERECTOMY N/A 12/02/2013   Procedure: ROBOTIC ASSISTED TOTAL HYSTERECTOMY;  Surgeon: Princess Bruins, MD;  Location: [facility];  Service: Gynecology;  Laterality: N/A;  . TONSILLECTOMY  1995    Past Medical Hx:  Past Medical History:  Diagnosis Date  . Achalasia    S/p Heller myotomy 1991 and numerous dilations  . Breast cancer ([place]) 12/01/2018   right, DCIS  . Family history of breast cancer   . GERD (gastroesophageal reflux disease)    Hx - None since 2013 Heller myotomy surgery  . Headache    migraines  . Hypertension   . Pleural effusion 2013   with Heller myotomy surgery    Past Gynecological History:  See HPI Patient's last menstrual period was 11/17/2013.  Family Hx:  Family History  Problem Relation Age of Onset  . Hypertension Mother   . Prostate cancer Father 46  . Breast cancer Paternal Grandmother        late 84s?  . Lung cancer Brother 102  . Other Maternal Grandmother        Childbirth  . Anesthesia problems Neg Hx   . Hypotension Neg Hx   . Malignant hyperthermia Neg Hx   . Pseudochol deficiency Neg Hx     Review of Systems:  Constitutional  Feels well,    ENT Normal appearing ears and nares bilaterally Skin/Breast  No rash, sores, jaundice, itching, dryness Cardiovascular  No chest pain, shortness of breath, or edema  Pulmonary  No cough or wheeze.  Gastro Intestinal  No nausea, vomitting, or diarrhoea. No bright red blood per rectum, no abdominal pain, change in bowel movement, or constipation.  Genito Urinary  No frequency, urgency, dysuria, no bleeding Musculo Skeletal  No myalgia, arthralgia, joint swelling or pain  Neurologic  No weakness, numbness, change in gait,  Psychology  No depression, anxiety, insomnia.   Vitals:  Blood pressure (!) 152/79, pulse 72, temperature 99.4 F (37.4 C), temperature source Oral, resp. rate 18, height 5' 6"  (1.676  m), weight 229 lb (103.9 kg), last menstrual period 11/17/2013, SpO2 100 %.  Physical Exam: WD in NAD Neck  Supple NROM, without any enlargements.  Lymph Node Survey No cervical supraclavicular or inguinal adenopathy Cardiovascular  Pulse normal rate, regularity and rhythm. S1 and S2 normal.  Lungs  Clear to auscultation bilateraly, without wheezes/crackles/rhonchi. Good air movement.  Skin  No rash/lesions/breakdown  Psychiatry  Alert and oriented to person, place, and time  Abdomen  Normoactive bowel sounds, abdomen soft, non-tender and obese without evidence of hernia but there is some weakness appreciated in the supra-umbilical midline/diastasis which underlies the incision.  Back No CVA tenderness Genito Urinary  Vulva/vagina: Normal external female genitalia.  No lesions. No discharge or bleeding.  Bladder/urethra:  No lesions or masses, well supported bladder  Vagina: normal  Cervix and uterus surgically absent  Adnexa: no palpable masses. Rectal  deferred Extremities  No bilateral cyanosis, clubbing or edema.   Thereasa Solo, MD  03/11/2019, 11:48 AM

## 2019-03-11 NOTE — Progress Notes (Signed)
Consult Note: Gyn-Onc  Consult was requested by Dr. Donne Hazel for the evaluation of [name] 48 y.o. female  CC:  Chief Complaint  Patient presents with  . 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  I discussed with Samiyah what the significance of BRCA2 mutations and the increased risk for ovarian cancer. I discussed that there are 2 options for proceding: 1/ surveillance with [place] and CA 125 every 6 months. I discussed that this does not prevent cancer but hopes to diagnose it at an earlier stage. This is typically reserved for women who have not completed childbearing or women who are poor surgical candidates.  2/ surgical risk reduction with BSO (typically accomplished with minimally invasive surgery).   She is favoring the 2nd option. I discussed robotic BSO and the inherent risks including  bleeding, infection, damage to internal organs (such as bladder,ureters, bowels), blood clot, reoperation and rehospitalization. I discussed that risks are higher for her given her prior surgical history and the potential for "scar tissue"/adhesive disease.  She requested first available timing given that she has breast reconstructive surgery scheduled for 04/24/19, and so we scheduled her with my partner, Dr Janie Morning.   We will evaluate the patient with a TVUS preoperatively to ensure she has no gross adnexal pathology.   HPI: [name] is a 48 year old P2 who is seen in consultation at the request of Dr Donne Hazel for a deleterious mutation in [place] in the setting of a personal history of hormone receptor positive breast cancer.  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  She underwent definitive surgery after 2-week course of preoperative tamoxifen.  The surgery was on  01/23/2019 with Dr. Donne Hazel.  It included bilateral simple mastectomy and right sentinel lymph node biopsy of the axilla.  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  The tumor was 0.5 cm.  It strongly stained for ER and PR.  She did well postoperatively.  She made a plan for reconstructive surgery in August 2020.  Given her premenopausal age she was a candidate for genetic testing.  This took place on December 10, 2018.  It confirmed a pathologic variant in BRCA2.  The patient's daughter has been subsequently tested and was positive.  The patient's personal history is significant for diagnosis of obesity (BMI 37 kg/m), hypertension, and a history of esophageal stricture and achalasia.  This was definitive treated in 1990 with thoracic surgery with a Heller myotomy.  This was revised laparoscopically with Dr. Johnathan Hausen in 7510 and an uncomplicated procedure.  She subsequently underwent a robotic hysterectomy that was uncomplicated with Dr. Dellis Filbert.  Pathology from this confirmed benign fibroids.  Patient had 2 prior cesarean section deliveries.  She takes no blood thinner medications.  She works as a Cabin crew for.  Family history significant for father with prostate cancer, a paternal grandmother with breast cancer, a brother with lung cancer.   Current Meds:  Outpatient Encounter Medications as of 03/11/2019  Medication Sig  . EPINEPHrine 0.3 mg/0.3 mL IJ SOAJ injection Inject into the muscle. As needed for shrimp allergy  . [DISCONTINUED] ibuprofen (ADVIL,MOTRIN) 800 MG tablet Take 800 mg by mouth every 8 (eight) hours  as needed for fever.  . [DISCONTINUED] lisinopril-hydrochlorothiazide (PRINZIDE,ZESTORETIC) 10-12.5 MG tablet Take 1 tablet by mouth daily.   No facility-administered encounter medications on file as of 03/11/2019.     Allergy:  Allergies  Allergen Reactions  . Lisinopril-Hydrochlorothiazide Swelling    Lips  swelling.  . Shellfish Allergy Hives    Social Hx:   Social History   Socioeconomic History  . Marital status: Married    Spouse name: Jeneen Rinks  . Number of children: 2  . Years of education: 56  . Highest education level: Not on file  Occupational History    Employer: [employer]  Social Needs  . Financial resource strain: Not on file  . Food insecurity    Worry: Never true    Inability: Never true  . Transportation needs    Medical: No    Non-medical: No  Tobacco Use  . Smoking status: Former Smoker    Packs/day: 0.25    Years: 0.50    Pack years: 0.12    Types: Cigarettes    Quit date: 10/07/1990    Years since quitting: 28.4  . Smokeless tobacco: Never Used  . Tobacco comment: Smoked for 2 months, nothing heavier  Substance and Sexual Activity  . Alcohol use: Yes    Alcohol/week: 1.0 standard drinks    Types: 1 Glasses of wine per week    Comment: Rarely  . Drug use: No  . Sexual activity: Yes    Birth control/protection: Surgical    Comment: hysterectomy w/ BSO on 12/02/2013  Lifestyle  . Physical activity    Days per week: Not on file    Minutes per session: Not on file  . Stress: Not on file  Relationships  . Social Herbalist on phone: Not on file    Gets together: Not on file    Attends religious service: Not on file    Active member of club or organization: Not on file    Attends meetings of clubs or organizations: Not on file    Relationship status: Not on file  . Intimate partner violence    Fear of current or ex partner: Not on file    Emotionally abused: Not on file    Physically abused: Not on file    Forced sexual activity: Not on file  Other Topics Concern  . Not on file  Social History Narrative   Lives at home with husband, working and goes to school, has 2 kids. Very minimal smoking history, very rare alcohol.    Caffeine -rarely    Past Surgical Hx:  Past Surgical History:  Procedure Laterality Date  . ACHALASIA  SURGERY  1991   transthoracic Heller myotomy in [place]  . BILATERAL SALPINGECTOMY Bilateral 12/02/2013   Procedure: BILATERAL SALPINGECTOMY;  Surgeon: Princess Bruins, MD;  Location: [facility];  Service: Gynecology;  Laterality: Bilateral;  . BREAST RECONSTRUCTION WITH PLACEMENT OF TISSUE EXPANDER AND FLEX HD (ACELLULAR HYDRATED DERMIS) Bilateral 01/23/2019   Procedure: BREAST RECONSTRUCTION WITH PLACEMENT OF TISSUE EXPANDER AND FLEX HD (ACELLULAR HYDRATED DERMIS);  Surgeon: Irene Limbo, MD;  Location: [facility];  Service: Plastics;  Laterality: Bilateral;  . CESAREAN SECTION     x2  . ESOPHAGOGASTRODUODENOSCOPY  10/08/2011   Procedure: ESOPHAGOGASTRODUODENOSCOPY (EGD);  Surgeon: Jeryl Columbia, MD;  Location: [facility];  Service: Endoscopy;  Laterality: N/A;  . ESOPHAGOGASTRODUODENOSCOPY  10/16/2011   Procedure: ESOPHAGOGASTRODUODENOSCOPY (EGD);  Surgeon: Cleotis Nipper, MD;  Location: [facility];  Service: Endoscopy;  Laterality: N/A;  . ESOPHAGOGASTRODUODENOSCOPY  11/06/2011   Procedure: ESOPHAGOGASTRODUODENOSCOPY (EGD);  Surgeon: Lear Ng, MD;  Location: [facility];  Service: Endoscopy;  Laterality: N/A;  . FOREIGN BODY REMOVAL  10/08/2011   Procedure: FOREIGN BODY REMOVAL;  Surgeon: Jeryl Columbia, MD;  Location: [facility]COPY;  Service: Endoscopy;  Laterality: N/A;  botox  needed /ja/magod  . FOREIGN BODY REMOVAL  10/16/2011   Procedure: FOREIGN BODY REMOVAL;  Surgeon: Cleotis Nipper, MD;  Location: [facility];  Service: Endoscopy;  Laterality: N/A;  . HELLER MYOTOMY  11/07/2011   Procedure: LAPAROSCOPIC HELLER MYOTOMY;  Surgeon: Pedro Earls, MD;  Location: [facility];  Service: General;  Laterality: N/A;  Laparoscopic Redo Heller Myotomy   . MASTECTOMY W/ SENTINEL NODE BIOPSY Bilateral 01/23/2019   Procedure: RIGHT MASTECTOMY WITH RIGHT AXILLARY SENTINEL LYMPH NODE BIOPSY AND LEFT RISK REDUCING MASTECTOMY;  Surgeon: Rolm Bookbinder, MD;  Location: [facility];  Service: General;  Laterality: Bilateral;  . ROBOTIC ASSISTED TOTAL HYSTERECTOMY N/A 12/02/2013   Procedure: ROBOTIC ASSISTED TOTAL HYSTERECTOMY;  Surgeon: Princess Bruins, MD;  Location: [facility];  Service: Gynecology;  Laterality: N/A;  . TONSILLECTOMY  1995    Past Medical Hx:  Past Medical History:  Diagnosis Date  . Achalasia    S/p Heller myotomy 1991 and numerous dilations  . Breast cancer ([place]) 12/01/2018   right, DCIS  . Family history of breast cancer   . GERD (gastroesophageal reflux disease)    Hx - None since 2013 Heller myotomy surgery  . Headache    migraines  . Hypertension   . Pleural effusion 2013   with Heller myotomy surgery    Past Gynecological History:  See HPI Patient's last menstrual period was 11/17/2013.  Family Hx:  Family History  Problem Relation Age of Onset  . Hypertension Mother   . Prostate cancer Father 27  . Breast cancer Paternal Grandmother        late 42s?  . Lung cancer Brother 24  . Other Maternal Grandmother        Childbirth  . Anesthesia problems Neg Hx   . Hypotension Neg Hx   . Malignant hyperthermia Neg Hx   . Pseudochol deficiency Neg Hx     Review of Systems:  Constitutional  Feels well,    ENT Normal appearing ears and nares bilaterally Skin/Breast  No rash, sores, jaundice, itching, dryness Cardiovascular  No chest pain, shortness of breath, or edema  Pulmonary  No cough or wheeze.  Gastro Intestinal  No nausea, vomitting, or diarrhoea. No bright red blood per rectum, no abdominal pain, change in bowel movement, or constipation.  Genito Urinary  No frequency, urgency, dysuria, no bleeding Musculo Skeletal  No myalgia, arthralgia, joint swelling or pain  Neurologic  No weakness, numbness, change in gait,  Psychology  No depression, anxiety, insomnia.   Vitals:  Blood pressure (!) 152/79, pulse 72, temperature 99.4 F (37.4 C), temperature source Oral, resp. rate 18, height 5' 6"  (1.676  m), weight 229 lb (103.9 kg), last menstrual period 11/17/2013, SpO2 100 %.  Physical Exam: WD in NAD Neck  Supple NROM, without any enlargements.  Lymph Node Survey No cervical supraclavicular or inguinal adenopathy Cardiovascular  Pulse normal rate, regularity and rhythm. S1 and S2 normal.  Lungs  Clear to auscultation bilateraly, without wheezes/crackles/rhonchi. Good air movement.  Skin  No rash/lesions/breakdown  Psychiatry  Alert and oriented to person, place, and time  Abdomen  Normoactive bowel sounds, abdomen soft, non-tender and obese without evidence of hernia but there is some weakness appreciated in the supra-umbilical midline/diastasis which underlies the incision.  Back No CVA tenderness Genito Urinary  Vulva/vagina: Normal external female genitalia.  No lesions. No discharge or bleeding.  Bladder/urethra:  No lesions or masses, well supported bladder  Vagina: normal  Cervix and uterus surgically absent  Adnexa: no palpable masses. Rectal  deferred Extremities  No bilateral cyanosis, clubbing or edema.   Thereasa Solo, MD  03/11/2019, 11:48 AM

## 2019-03-16 ENCOUNTER — Ambulatory Visit: Payer: Managed Care, Other (non HMO) | Attending: Plastic Surgery | Admitting: Rehabilitation

## 2019-03-16 ENCOUNTER — Encounter: Payer: Self-pay | Admitting: Rehabilitation

## 2019-03-16 ENCOUNTER — Other Ambulatory Visit: Payer: Self-pay

## 2019-03-16 DIAGNOSIS — M25612 Stiffness of left shoulder, not elsewhere classified: Secondary | ICD-10-CM | POA: Diagnosis present

## 2019-03-16 DIAGNOSIS — M25511 Pain in right shoulder: Secondary | ICD-10-CM

## 2019-03-16 DIAGNOSIS — R293 Abnormal posture: Secondary | ICD-10-CM | POA: Diagnosis present

## 2019-03-16 DIAGNOSIS — R600 Localized edema: Secondary | ICD-10-CM | POA: Diagnosis present

## 2019-03-16 DIAGNOSIS — M25611 Stiffness of right shoulder, not elsewhere classified: Secondary | ICD-10-CM | POA: Insufficient documentation

## 2019-03-16 NOTE — Therapy (Signed)
[place], [place], [address] Phone: [phone]   Fax:  [phone]  Physical Therapy Treatment  Patient Details  Name: [name] MRN: [id number] Date of Birth: 10/03/1970 Referring Provider (PT): Th[place]ma[place]   Encounter Date: 03/16/2019  PT End of Session - 03/16/19 1354    Visit Number  5    Number of Visits  9    Date for PT Re-Evaluation  03/17/19    PT Start Time  1400    PT Stop Time  1425    PT Time Calculation (min)  25 min    Activity Tolerance  Patient tolerated treatment well    Behavior During Therapy  [facility] for tasks assessed/performed       Past Medical History:  Diagnosis Date  . Achalasia    S/p Heller myotomy 1991 and numerous dilations  . Breast cancer ([place]) 12/01/2018   right, DCIS  . Family history of breast cancer   . GERD (gastroesophageal reflux disease)    Hx - None since 2013 Heller myotomy surgery  . Headache    migraines  . Hypertension   . Pleural effusion 2013   with Heller myotomy surgery    Past Surgical History:  Procedure Laterality Date  . ACHALASIA SURGERY  1991   transthoracic Heller myotomy in [place]  . BILATERAL SALPINGECTOMY Bilateral 12/02/2013   Procedure: BILATERAL SALPINGECTOMY;  Surgeon: Princess Bruins, MD;  Location: [facility];  Service: Gynecology;  Laterality: Bilateral;  . BREAST RECONSTRUCTION WITH PLACEMENT OF TISSUE EXPANDER AND FLEX HD (ACELLULAR HYDRATED DERMIS) Bilateral 01/23/2019   Procedure: BREAST RECONSTRUCTION WITH PLACEMENT OF TISSUE EXPANDER AND FLEX HD (ACELLULAR HYDRATED DERMIS);  Surgeon: Irene Limbo, MD;  Location: [facility];  Service: Plastics;  Laterality: Bilateral;  . CESAREAN SECTION     x2  . ESOPHAGOGASTRODUODENOSCOPY  10/08/2011   Procedure: ESOPHAGOGASTRODUODENOSCOPY (EGD);  Surgeon: Jeryl Columbia, MD;  Location: [facility]COPY;  Service: Endoscopy;  Laterality: N/A;  . ESOPHAGOGASTRODUODENOSCOPY   10/16/2011   Procedure: ESOPHAGOGASTRODUODENOSCOPY (EGD);  Surgeon: Cleotis Nipper, MD;  Location: [facility];  Service: Endoscopy;  Laterality: N/A;  . ESOPHAGOGASTRODUODENOSCOPY  11/06/2011   Procedure: ESOPHAGOGASTRODUODENOSCOPY (EGD);  Surgeon: Lear Ng, MD;  Location: [facility];  Service: Endoscopy;  Laterality: N/A;  . FOREIGN BODY REMOVAL  10/08/2011   Procedure: FOREIGN BODY REMOVAL;  Surgeon: Jeryl Columbia, MD;  Location: [facility];  Service: Endoscopy;  Laterality: N/A;  botox  needed /ja/magod  . FOREIGN BODY REMOVAL  10/16/2011   Procedure: FOREIGN BODY REMOVAL;  Surgeon: Cleotis Nipper, MD;  Location[facility];  Service: Endoscopy;  Laterality: N/A;  . HELLER MYOTOMY  11/07/2011   Procedure: LAPAROSCOPIC HELLER MYOTOMY;  Surgeon: Pedro Earls, MD;  Location: [facility];  Service: General;  Laterality: N/A;  Laparoscopic Redo Heller Myotomy   . MASTECTOMY W/ SENTINEL NODE BIOPSY Bilateral 01/23/2019   Procedure: RIGHT MASTECTOMY WITH RIGHT AXILLARY SENTINEL LYMPH NODE BIOPSY AND LEFT RISK REDUCING MASTECTOMY;  Surgeon: Rolm Bookbinder, MD;  Location: [facility];  Service: General;  Laterality: Bilateral;  . ROBOTIC ASSISTED TOTAL HYSTERECTOMY N/A 12/02/2013   Procedure: ROBOTIC ASSISTED TOTAL HYSTERECTOMY;  Surgeon: Princess Bruins, MD;  Location: [facility];  Service: Gynecology;  Laterality: N/A;  . TONSILLECTOMY  1995    There were no vitals filed for this visit.  Subjective Assessment - 03/16/19 1355    Subjective  I have been okay.  I am okay  with today being the last day.  it doesn't hurt anymore.    Pertinent History  R breast cancer post bil mastectomy on 01/23/19, SLNB 4 nodes all negative, BRCA 2, drains removed 02/04/19, expanders placed, esophagus repair 2017    Patient Stated Goals  get back to normal, get ROM back    Currently in Pain?  No/denies                       [facility] Adult PT Treatment/Exercise - 03/16/19 0001       Exercises   Exercises  Other Exercises    Other Exercises   taught pt strength ABC program with performance of any that she had questions about.  Good overall knowledge of exercise.  Pt will be getting a peloton soon                  PT Long Term Goals - 03/16/19 1402      PT LONG TERM GOAL #1   Title  Pt will demonstrate 160 degrees of bilateral shoulder flexion to allow her to reach overhead.    Baseline  R:165   L:165    Status  Achieved      PT LONG TERM GOAL #2   Title  Pt will demonstrate 160 degrees of bilateral shoulder abduction to allow her to reach out to the side    Status  Achieved      PT LONG TERM GOAL #3   Title  Pt will demonstrate 65 degrees of right shoulder internal rotation to allow her to wash her back    Status  Achieved      PT LONG TERM GOAL #4   Title  Pt will be indepdent with a home exercise program for continued strengthening and stretching    Status  Achieved      PT LONG TERM GOAL #5   Title  Pt will be independent in self MLD to R lateral trunk to allow improved comfort    Status  Deferred            Plan - 03/16/19 1434    Clinical Impression Statement  Pt has met all goals, has no pain remaining, and is now ind with strength ABC program and home bike that she will be getting soon.  Will DC today       Patient will benefit from skilled therapeutic intervention in order to improve the following deficits and impairments:     Visit Diagnosis: 1. Stiffness of left shoulder, not elsewhere classified   2. Acute pain of right shoulder   3. Localized edema   4. Abnormal posture   5. Stiffness of right shoulder, not elsewhere classified        Problem List Patient Active Problem List   Diagnosis Date Noted  . Breast cancer, BRCA2 positive ([place]) 02/06/2019  . BRCA2 positive 12/24/2018  . Genetic testing 12/22/2018  . Family history of breast cancer   . Family history of prostate cancer   . Ductal carcinoma in situ  (DCIS) of right breast 12/08/2018  . Postoperative state 12/02/2013  . Chest pain 11/29/2011  . D-dimer, elevated 10/30/2011  . Aspiration pneumonia ([place]) 10/28/2011  . Achalasia 10/18/2011  . Leukocytosis 10/18/2011  . Thrombocytosis ([place]) 10/18/2011  . Pericardial effusion 10/18/2011  . Pleural effusion, left 10/18/2011  . Microcytic anemia 10/18/2011  . Tachycardia 10/18/2011  . SIRS (systemic inflammatory response syndrome) ([facility]) 10/18/2011    Jacey Eckerson, Adrian Prince  03/16/2019, 2:35 PM  [place], [place], [address] Phone: [phone]   Fax:  [phone]  Name: [name] MRN: [id number] Date of Birth: 11/02/1970  PHYSICAL THERAPY DISCHARGE SUMMARY  Visits from Start of Care: 5  Current functional level related to goals / functional outcomes:all goals met   Remaining deficits: Need for continued strength and conditioning   Education / Equipment: Supine scap, strength ABC, stretches Plan: Patient agrees to discharge.  Patient goals were met. Patient is being discharged due to meeting the stated rehab goals.  ?????    Shan Levans, PT

## 2019-03-17 ENCOUNTER — Telehealth: Payer: Self-pay

## 2019-03-17 NOTE — Telephone Encounter (Signed)
LM for Lauren that there was no prior authorization needed for surgery per [facility] Cert # [id number]. No precertification needed per auto system. Call ref# [id number].

## 2019-03-23 ENCOUNTER — Other Ambulatory Visit (HOSPITAL_COMMUNITY)
Admission: RE | Admit: 2019-03-23 | Discharge: 2019-03-23 | Disposition: A | Discharge status: Home / Self Care | Payer: Managed Care, Other (non HMO) | Source: Ambulatory Visit | Attending: Gynecologic Oncology | Admitting: Gynecologic Oncology

## 2019-03-23 DIAGNOSIS — Z1159 Encounter for screening for other viral diseases: Secondary | ICD-10-CM | POA: Diagnosis present

## 2019-03-23 LAB — SARS CORONAVIRUS 2 (TAT 6-24 HRS): SARS Coronavirus 2: NEGATIVE

## 2019-03-23 NOTE — Progress Notes (Signed)
EKG 01-15-19 EPIC

## 2019-03-23 NOTE — Patient Instructions (Addendum)
YOU  HAD  A COVID 19 TEST ON 03-23-2019.   ONCE YOUR COVID TEST IS COMPLETED, PLEASE BEGIN THE QUARANTINE INSTRUCTIONS AS OUTLINED IN YOUR HANDOUT.                [name]    Your procedure is scheduled on: 03-26-2019   Report to [facility] Main  Entrance  Report to [place] at 5:30  AM      Call this number if you have problems the morning of surgery [phone]    Remember: Do not eat food  :After Midnight.  Clear liquids until 4:30 AM    BRUSH YOUR TEETH MORNING OF SURGERY AND RINSE YOUR MOUTH OUT, NO CHEWING GUM CANDY OR MINTS.   Eat a light diet the day before surgery.   Examples including soups, broths, toast, yogurt, mashed potatoes.  Things to avoid include carbonated beverages (fizzy beverages), raw fruits and raw vegetables, or beans.   If your bowels are filled with gas, your surgeon will have difficulty visualizing your pelvic organs which increases your surgical risks.   CLEAR LIQUID DIET   Foods Allowed                                                                     Foods Excluded  Coffee and tea, regular and decaf                             liquids that you cannot  Plain Jell-O in any flavor                                             see through such as: Fruit ices (not with fruit pulp)                                     milk, soups, orange juice  Iced Popsicles                                    All solid food                                   Cranberry, grape and apple juices Sports drinks like Gatorade Lightly seasoned clear broth or consume(fat free) Sugar, honey syrup  Sample Menu Breakfast                                Lunch                                     Supper Cranberry juice                    Beef broth  Chicken broth Jell-O                                     Grape juice                           Apple juice Coffee or tea                        Jell-O                                       Popsicle                                                Coffee or tea                        Coffee or tea  _____________________________________________________________________     Take these medicines the morning of surgery with A SIP OF WATER: none                               You may not have any metal on your body including hair pins and              piercings              Do not wear jewelry, make-up, lotions, powders or perfumes, deodorant             Do not wear nail polish.  Do not shave  48 hours prior to surgery.               Do not bring valuables to the hospital. [place].  Contacts, dentures or bridgework may not be worn into surgery.       Patients discharged the day of surgery will not be allowed to drive home.  IF YOU ARE HAVING SURGERY AND GOING HOME THE SAME DAY, YOU MUST HAVE AN ADULT TO DRIVE YOU HOME AND BE WITH YOU FOR 24 HOURS . YOU MAY GO HOME BY TAXI OR UBER OR ORTHERWISE, BUT AN ADULT MUST ACCOMPANY YOU HOME AND STAY WITH YOU FOR 24 HOURS.  Name and phone number of your driver:  Special Instructions: N/A              Please read over the following fact sheets you were given: _____________________________________________________________________             [facility] - Preparing for Surgery Before surgery, you can play an important role.   Because skin is not sterile, your skin needs to be as free of germs as possible.   You can reduce the number of germs on your skin by washing with CHG (chlorahexidine gluconate) soap before surgery .  CHG is an antiseptic cleaner which kills germs and bonds with the skin to continue killing germs even after washing. Please DO NOT use if you have an allergy to CHG or antibacterial soaps.  If your skin becomes reddened/irritated stop using the CHG and inform your nurse when you arrive at Short Stay. Do not shave (including legs and underarms) for at least 48 hours  prior to the first CHG shower.Please follow these instructions carefully:  1.  Shower with CHG Soap the night before surgery and the  morning of Surgery.  2.  If you choose to wash your hair, wash your hair first as usual with your  normal  shampoo.  3.  After you shampoo, rinse your hair and body thoroughly to remove the  shampoo.                                        4.  Use CHG as you would any other liquid soap.  You can apply chg directly  to the skin and wash                       Gently with a scrungie or clean washcloth.  5.  Apply the CHG Soap to your body ONLY FROM THE NECK DOWN.   Do not use on face/ open                           Wound or open sores. Avoid contact with eyes, ears mouth and genitals (private parts).                       Wash face,  Genitals (private parts) with your normal soap.             6.  Wash thoroughly, paying special attention to the area where your surgery  will be performed.  7.  Thoroughly rinse your body with warm water from the neck down.  8.  DO NOT shower/wash with your normal soap after using and rinsing off  the CHG Soap.             9.  Pat yourself dry with a clean towel.            10.  Wear clean pajamas.            11.  Place clean sheets on your bed the night of your first shower and do not  sleep with pets.  Day of Surgery : Do not apply any lotions/deodorants the morning of surgery.  Please wear clean clothes to the hospital/surgery center.   FAILURE TO FOLLOW THESE INSTRUCTIONS MAY RESULT IN THE CANCELLATION OF YOUR SURGERY PATIENT SIGNATURE_________________________________  NURSE SIGNATURE__________________________________  ________________________________________________________________________   [name]  An incentive spirometer is a tool that can help keep your lungs clear and active. This tool measures how well you are filling your lungs with each breath. Taking long deep breaths may help reverse or decrease the  chance of developing breathing (pulmonary) problems (especially infection) following:  A long period of time when you are unable to move or be active. BEFORE THE PROCEDURE   If the spirometer includes an indicator to show your best effort, your nurse or respiratory therapist will set it to a desired goal.  If possible, sit up straight or lean slightly forward. Try not to slouch.  Hold the incentive spirometer in an upright position. INSTRUCTIONS FOR USE  1. Sit on the edge of your bed if possible, or sit up as far as you  can in bed or on a chair. 2. Hold the incentive spirometer in an upright position. 3. Breathe out normally. 4. Place the mouthpiece in your mouth and seal your lips tightly around it. 5. Breathe in slowly and as deeply as possible, raising the piston or the ball toward the top of the column. 6. Hold your breath for 3-5 seconds or for as long as possible. Allow the piston or ball to fall to the bottom of the column. 7. Remove the mouthpiece from your mouth and breathe out normally. 8. Rest for a few seconds and repeat Steps 1 through 7 at least 10 times every 1-2 hours when you are awake. Take your time and take a few normal breaths between deep breaths. 9. The spirometer may include an indicator to show your best effort. Use the indicator as a goal to work toward during each repetition. 10. After each set of 10 deep breaths, practice coughing to be sure your lungs are clear. If you have an incision (the cut made at the time of surgery), support your incision when coughing by placing a pillow or rolled up towels firmly against it. Once you are able to get out of bed, walk around indoors and cough well. You may stop using the incentive spirometer when instructed by your caregiver.  RISKS AND COMPLICATIONS  Take your time so you do not get dizzy or light-headed.  If you are in pain, you may need to take or ask for pain medication before doing incentive spirometry. It is harder  to take a deep breath if you are having pain. AFTER USE  Rest and breathe slowly and easily.  It can be helpful to keep track of a log of your progress. Your caregiver can provide you with a simple table to help with this. If you are using the spirometer at home, follow these instructions: [place] IF:   You are having difficultly using the spirometer.  You have trouble using the spirometer as often as instructed.  Your pain medication is not giving enough relief while using the spirometer.  You develop fever of 100.5 F (38.1 C) or higher. SEEK IMMEDIATE MEDICAL CARE IF:   You cough up bloody sputum that had not been present before.  You develop fever of 102 F (38.9 C) or greater.  You develop worsening pain at or near the incision site. MAKE SURE YOU:   Understand these instructions.  Will watch your condition.  Will get help right away if you are not doing well or get worse. Document Released: 01/07/2007 Document Revised: 11/19/2011 Document Reviewed: 03/10/2007 Exit[employer] Patient Information 2014 [employer], [place].   ________________________________________________________________________  WHAT IS A BLOOD TRANSFUSION? Blood Transfusion Information  A transfusion is the replacement of blood or some of its parts. Blood is made up of multiple cells which provide different functions.  Red blood cells carry oxygen and are used for blood loss replacement.  White blood cells fight against infection.  Platelets control bleeding.  Plasma helps clot blood.  Other blood products are available for specialized needs, such as hemophilia or other clotting disorders. BEFORE THE TRANSFUSION  Who gives blood for transfusions?   Healthy volunteers who are fully evaluated to make sure their blood is safe. This is blood bank blood. Transfusion therapy is the safest it has ever been in the practice of medicine. Before blood is taken from a donor, a complete history is taken  to make sure that person has no history of diseases nor  engages in risky social behavior (examples are intravenous drug use or sexual activity with multiple partners). The donor's travel history is screened to minimize risk of transmitting infections, such as malaria. The donated blood is tested for signs of infectious diseases, such as HIV and hepatitis. The blood is then tested to be sure it is compatible with you in order to minimize the chance of a transfusion reaction. If you or a relative donates blood, this is often done in anticipation of surgery and is not appropriate for emergency situations. It takes many days to process the donated blood. RISKS AND COMPLICATIONS Although transfusion therapy is very safe and saves many lives, the main dangers of transfusion include:   Getting an infectious disease.  Developing a transfusion reaction. This is an allergic reaction to something in the blood you were given. Every precaution is taken to prevent this. The decision to have a blood transfusion has been considered carefully by your caregiver before blood is given. Blood is not given unless the benefits outweigh the risks. AFTER THE TRANSFUSION  Right after receiving a blood transfusion, you will usually feel much better and more energetic. This is especially true if your red blood cells have gotten low (anemic). The transfusion raises the level of the red blood cells which carry oxygen, and this usually causes an energy increase.  The nurse administering the transfusion will monitor you carefully for complications. HOME CARE INSTRUCTIONS  No special instructions are needed after a transfusion. You may find your energy is better. Speak with your caregiver about any limitations on activity for underlying diseases you may have. SEEK MEDICAL CARE IF:   Your condition is not improving after your transfusion.  You develop redness or irritation at the intravenous (IV) site. SEEK IMMEDIATE MEDICAL CARE  IF:  Any of the following symptoms occur over the next 12 hours:  Shaking chills.  You have a temperature by mouth above 102 F (38.9 C), not controlled by medicine.  Chest, back, or muscle pain.  People around you feel you are not acting correctly or are confused.  Shortness of breath or difficulty breathing.  Dizziness and fainting.  You get a rash or develop hives.  You have a decrease in urine output.  Your urine turns a dark color or changes to pink, red, or brown. Any of the following symptoms occur over the next 10 days:  You have a temperature by mouth above 102 F (38.9 C), not controlled by medicine.  Shortness of breath.  Weakness after normal activity.  The white part of the eye turns yellow (jaundice).  You have a decrease in the amount of urine or are urinating less often.  Your urine turns a dark color or changes to pink, red, or brown. Document Released: 08/24/2000 Document Revised: 11/19/2011 Document Reviewed: 04/12/2008 [facility] Patient Information 2014 [place], [place].  _______________________________________________________________________

## 2019-03-24 ENCOUNTER — Encounter (HOSPITAL_COMMUNITY): Payer: Self-pay

## 2019-03-24 ENCOUNTER — Encounter (HOSPITAL_COMMUNITY)
Admission: RE | Admit: 2019-03-24 | Discharge: 2019-03-24 | Disposition: A | Discharge status: Home / Self Care | Payer: Managed Care, Other (non HMO) | Source: Ambulatory Visit | Attending: Gynecologic Oncology | Admitting: Gynecologic Oncology

## 2019-03-24 ENCOUNTER — Other Ambulatory Visit: Payer: Self-pay

## 2019-03-24 DIAGNOSIS — K222 Esophageal obstruction: Secondary | ICD-10-CM | POA: Diagnosis not present

## 2019-03-24 DIAGNOSIS — I10 Essential (primary) hypertension: Secondary | ICD-10-CM | POA: Diagnosis not present

## 2019-03-24 DIAGNOSIS — Z8249 Family history of ischemic heart disease and other diseases of the circulatory system: Secondary | ICD-10-CM | POA: Diagnosis not present

## 2019-03-24 DIAGNOSIS — Z87891 Personal history of nicotine dependence: Secondary | ICD-10-CM | POA: Diagnosis not present

## 2019-03-24 DIAGNOSIS — Z888 Allergy status to other drugs, medicaments and biological substances status: Secondary | ICD-10-CM | POA: Diagnosis not present

## 2019-03-24 DIAGNOSIS — Z791 Long term (current) use of non-steroidal anti-inflammatories (NSAID): Secondary | ICD-10-CM | POA: Diagnosis not present

## 2019-03-24 DIAGNOSIS — Z801 Family history of malignant neoplasm of trachea, bronchus and lung: Secondary | ICD-10-CM | POA: Diagnosis not present

## 2019-03-24 DIAGNOSIS — Z6837 Body mass index (BMI) 37.0-37.9, adult: Secondary | ICD-10-CM | POA: Diagnosis not present

## 2019-03-24 DIAGNOSIS — Z01812 Encounter for preprocedural laboratory examination: Secondary | ICD-10-CM | POA: Insufficient documentation

## 2019-03-24 DIAGNOSIS — Z90710 Acquired absence of both cervix and uterus: Secondary | ICD-10-CM | POA: Diagnosis not present

## 2019-03-24 DIAGNOSIS — K219 Gastro-esophageal reflux disease without esophagitis: Secondary | ICD-10-CM | POA: Diagnosis not present

## 2019-03-24 DIAGNOSIS — Z1501 Genetic susceptibility to malignant neoplasm of breast: Secondary | ICD-10-CM | POA: Diagnosis not present

## 2019-03-24 DIAGNOSIS — Z803 Family history of malignant neoplasm of breast: Secondary | ICD-10-CM | POA: Diagnosis not present

## 2019-03-24 DIAGNOSIS — G43909 Migraine, unspecified, not intractable, without status migrainosus: Secondary | ICD-10-CM | POA: Diagnosis not present

## 2019-03-24 DIAGNOSIS — E669 Obesity, unspecified: Secondary | ICD-10-CM | POA: Diagnosis not present

## 2019-03-24 DIAGNOSIS — Z1502 Genetic susceptibility to malignant neoplasm of ovary: Secondary | ICD-10-CM | POA: Diagnosis not present

## 2019-03-24 DIAGNOSIS — Z9221 Personal history of antineoplastic chemotherapy: Secondary | ICD-10-CM | POA: Diagnosis not present

## 2019-03-24 DIAGNOSIS — Z8042 Family history of malignant neoplasm of prostate: Secondary | ICD-10-CM | POA: Diagnosis not present

## 2019-03-24 DIAGNOSIS — C50911 Malignant neoplasm of unspecified site of right female breast: Secondary | ICD-10-CM | POA: Diagnosis present

## 2019-03-24 DIAGNOSIS — Z91013 Allergy to seafood: Secondary | ICD-10-CM | POA: Diagnosis not present

## 2019-03-24 LAB — CBC
HCT: 36.9 % (ref 36.0–46.0)
Hemoglobin: 11.1 g/dL — ABNORMAL LOW (ref 12.0–15.0)
MCH: 21.2 pg — ABNORMAL LOW (ref 26.0–34.0)
MCHC: 30.1 g/dL (ref 30.0–36.0)
MCV: 70.4 fL — ABNORMAL LOW (ref 80.0–100.0)
Platelets: 313 10*3/uL (ref 150–400)
RBC: 5.24 MIL/uL — ABNORMAL HIGH (ref 3.87–5.11)
RDW: 17 % — ABNORMAL HIGH (ref 11.5–15.5)
WBC: 7.9 10*3/uL (ref 4.0–10.5)
nRBC: 0 % (ref 0.0–0.2)

## 2019-03-24 LAB — URINALYSIS, ROUTINE W REFLEX MICROSCOPIC
Bilirubin Urine: NEGATIVE
Glucose, UA: NEGATIVE mg/dL
Hgb urine dipstick: NEGATIVE
Ketones, ur: NEGATIVE mg/dL
Leukocytes,Ua: NEGATIVE
Nitrite: NEGATIVE
Protein, ur: 30 mg/dL — AB
Specific Gravity, Urine: 1.017 (ref 1.005–1.030)
pH: 6 (ref 5.0–8.0)

## 2019-03-24 LAB — BASIC METABOLIC PANEL
Anion gap: 9 (ref 5–15)
BUN: 10 mg/dL (ref 6–20)
CO2: 24 mmol/L (ref 22–32)
Calcium: 9 mg/dL (ref 8.9–10.3)
Chloride: 106 mmol/L (ref 98–111)
Creatinine, Ser: 0.8 mg/dL (ref 0.44–1.00)
GFR calc Af Amer: >60 mL/min (ref >60)
GFR calc non Af Amer: >60 mL/min (ref >60)
Glucose, Bld: 109 mg/dL — ABNORMAL HIGH (ref 70–99)
Potassium: 3.7 mmol/L (ref 3.5–5.1)
Sodium: 139 mmol/L (ref 135–145)

## 2019-03-24 LAB — ABO/RH: ABO/RH(D): A POS

## 2019-03-26 ENCOUNTER — Ambulatory Visit (HOSPITAL_COMMUNITY): Payer: Managed Care, Other (non HMO) | Admitting: Certified Registered Nurse Anesthetist

## 2019-03-26 ENCOUNTER — Encounter (HOSPITAL_COMMUNITY)
Admission: RE | Disposition: A | Discharge status: Home / Self Care | Payer: Self-pay | Source: Home / Self Care | Attending: Gynecologic Oncology

## 2019-03-26 ENCOUNTER — Ambulatory Visit (HOSPITAL_COMMUNITY)
Admission: RE | Admit: 2019-03-26 | Discharge: 2019-03-26 | Disposition: A | Discharge status: Home / Self Care | Payer: Managed Care, Other (non HMO) | Attending: Gynecologic Oncology | Admitting: Gynecologic Oncology

## 2019-03-26 ENCOUNTER — Ambulatory Visit (HOSPITAL_COMMUNITY): Payer: Managed Care, Other (non HMO) | Admitting: Emergency Medicine

## 2019-03-26 ENCOUNTER — Encounter (HOSPITAL_COMMUNITY): Payer: Self-pay | Admitting: *Deleted

## 2019-03-26 DIAGNOSIS — I10 Essential (primary) hypertension: Secondary | ICD-10-CM | POA: Insufficient documentation

## 2019-03-26 DIAGNOSIS — Z9221 Personal history of antineoplastic chemotherapy: Secondary | ICD-10-CM | POA: Insufficient documentation

## 2019-03-26 DIAGNOSIS — C50911 Malignant neoplasm of unspecified site of right female breast: Secondary | ICD-10-CM | POA: Insufficient documentation

## 2019-03-26 DIAGNOSIS — Z1502 Genetic susceptibility to malignant neoplasm of ovary: Secondary | ICD-10-CM | POA: Diagnosis not present

## 2019-03-26 DIAGNOSIS — Z1501 Genetic susceptibility to malignant neoplasm of breast: Secondary | ICD-10-CM

## 2019-03-26 DIAGNOSIS — K222 Esophageal obstruction: Secondary | ICD-10-CM | POA: Insufficient documentation

## 2019-03-26 DIAGNOSIS — Z791 Long term (current) use of non-steroidal anti-inflammatories (NSAID): Secondary | ICD-10-CM | POA: Insufficient documentation

## 2019-03-26 DIAGNOSIS — Z8042 Family history of malignant neoplasm of prostate: Secondary | ICD-10-CM | POA: Diagnosis not present

## 2019-03-26 DIAGNOSIS — G43909 Migraine, unspecified, not intractable, without status migrainosus: Secondary | ICD-10-CM | POA: Insufficient documentation

## 2019-03-26 DIAGNOSIS — Z801 Family history of malignant neoplasm of trachea, bronchus and lung: Secondary | ICD-10-CM | POA: Insufficient documentation

## 2019-03-26 DIAGNOSIS — Z8249 Family history of ischemic heart disease and other diseases of the circulatory system: Secondary | ICD-10-CM | POA: Insufficient documentation

## 2019-03-26 DIAGNOSIS — E669 Obesity, unspecified: Secondary | ICD-10-CM | POA: Insufficient documentation

## 2019-03-26 DIAGNOSIS — Z87891 Personal history of nicotine dependence: Secondary | ICD-10-CM | POA: Insufficient documentation

## 2019-03-26 DIAGNOSIS — Z888 Allergy status to other drugs, medicaments and biological substances status: Secondary | ICD-10-CM | POA: Insufficient documentation

## 2019-03-26 DIAGNOSIS — Z803 Family history of malignant neoplasm of breast: Secondary | ICD-10-CM | POA: Insufficient documentation

## 2019-03-26 DIAGNOSIS — Z90710 Acquired absence of both cervix and uterus: Secondary | ICD-10-CM | POA: Insufficient documentation

## 2019-03-26 DIAGNOSIS — Z6837 Body mass index (BMI) 37.0-37.9, adult: Secondary | ICD-10-CM | POA: Insufficient documentation

## 2019-03-26 DIAGNOSIS — K219 Gastro-esophageal reflux disease without esophagitis: Secondary | ICD-10-CM | POA: Insufficient documentation

## 2019-03-26 DIAGNOSIS — Z91013 Allergy to seafood: Secondary | ICD-10-CM | POA: Insufficient documentation

## 2019-03-26 HISTORY — PX: ROBOTIC ASSISTED SALPINGO OOPHERECTOMY: SHX6082

## 2019-03-26 LAB — TYPE AND SCREEN
ABO/RH(D): A POS
Antibody Screen: NEGATIVE

## 2019-03-26 SURGERY — ROBOTIC ASSISTED SALPINGO OOPHORECTOMY
Anesthesia: General | Laterality: Bilateral

## 2019-03-26 MED ORDER — LIDOCAINE HCL 2 % IJ SOLN
INTRAMUSCULAR | Status: AC
  Filled 2019-03-26: qty 20

## 2019-03-26 MED ORDER — ENOXAPARIN SODIUM 40 MG/0.4ML ~~LOC~~ SOLN
40.0000 mg | SUBCUTANEOUS | Status: AC
  Administered 2019-03-26: 40 mg via SUBCUTANEOUS
  Filled 2019-03-26: qty 0.4

## 2019-03-26 MED ORDER — SCOPOLAMINE 1 MG/3DAYS TD PT72
1.0000 | MEDICATED_PATCH | TRANSDERMAL | Status: DC
  Administered 2019-03-26: 1.5 mg via TRANSDERMAL
  Filled 2019-03-26: qty 1

## 2019-03-26 MED ORDER — OXYCODONE HCL 5 MG PO TABS: 5.0000 mg | ORAL_TABLET | Freq: Once | ORAL | Status: DC | PRN

## 2019-03-26 MED ORDER — MIDAZOLAM HCL 2 MG/2ML IJ SOLN
INTRAMUSCULAR | Status: AC
  Filled 2019-03-26: qty 2

## 2019-03-26 MED ORDER — DEXAMETHASONE SODIUM PHOSPHATE 10 MG/ML IJ SOLN
INTRAMUSCULAR | Status: DC | PRN
  Administered 2019-03-26: 6 mg via INTRAVENOUS

## 2019-03-26 MED ORDER — PHENYLEPHRINE 40 MCG/ML (10ML) SYRINGE FOR IV PUSH (FOR BLOOD PRESSURE SUPPORT)
PREFILLED_SYRINGE | INTRAVENOUS | Status: AC
  Filled 2019-03-26: qty 10

## 2019-03-26 MED ORDER — FENTANYL CITRATE (PF) 100 MCG/2ML IJ SOLN: 25.0000 ug | INTRAMUSCULAR | Status: DC | PRN

## 2019-03-26 MED ORDER — SUCCINYLCHOLINE CHLORIDE 200 MG/10ML IV SOSY
PREFILLED_SYRINGE | INTRAVENOUS | Status: DC | PRN
  Administered 2019-03-26: 100 mg via INTRAVENOUS

## 2019-03-26 MED ORDER — CELECOXIB 200 MG PO CAPS
400.0000 mg | ORAL_CAPSULE | ORAL | Status: AC
  Administered 2019-03-26: 400 mg via ORAL
  Filled 2019-03-26: qty 2

## 2019-03-26 MED ORDER — ONDANSETRON HCL 4 MG/2ML IJ SOLN: 4.0000 mg | Freq: Four times a day (QID) | INTRAMUSCULAR | Status: DC | PRN

## 2019-03-26 MED ORDER — LIDOCAINE 2% (20 MG/ML) 5 ML SYRINGE
INTRAMUSCULAR | Status: AC
  Filled 2019-03-26: qty 5

## 2019-03-26 MED ORDER — PHENYLEPHRINE HCL (PRESSORS) 10 MG/ML IV SOLN
INTRAVENOUS | Status: AC
  Filled 2019-03-26: qty 1

## 2019-03-26 MED ORDER — DEXAMETHASONE SODIUM PHOSPHATE 10 MG/ML IJ SOLN
INTRAMUSCULAR | Status: AC
  Filled 2019-03-26: qty 1

## 2019-03-26 MED ORDER — SUGAMMADEX SODIUM 200 MG/2ML IV SOLN
INTRAVENOUS | Status: DC | PRN
  Administered 2019-03-26: 250 mg via INTRAVENOUS

## 2019-03-26 MED ORDER — OXYCODONE HCL 5 MG/5ML PO SOLN: 5.0000 mg | Freq: Once | ORAL | Status: DC | PRN

## 2019-03-26 MED ORDER — ROCURONIUM BROMIDE 50 MG/5ML IV SOSY
PREFILLED_SYRINGE | INTRAVENOUS | Status: DC | PRN
  Administered 2019-03-26: 50 mg via INTRAVENOUS

## 2019-03-26 MED ORDER — GABAPENTIN 300 MG PO CAPS
300.0000 mg | ORAL_CAPSULE | ORAL | Status: AC
  Administered 2019-03-26: 300 mg via ORAL
  Filled 2019-03-26: qty 1

## 2019-03-26 MED ORDER — CEFAZOLIN SODIUM-DEXTROSE 2-4 GM/100ML-% IV SOLN
2.0000 g | INTRAVENOUS | Status: AC
  Administered 2019-03-26: 2 g via INTRAVENOUS
  Filled 2019-03-26: qty 100

## 2019-03-26 MED ORDER — FENTANYL CITRATE (PF) 250 MCG/5ML IJ SOLN
INTRAMUSCULAR | Status: AC
  Filled 2019-03-26: qty 5

## 2019-03-26 MED ORDER — MIDAZOLAM HCL 5 MG/5ML IJ SOLN
INTRAMUSCULAR | Status: DC | PRN
  Administered 2019-03-26: 2 mg via INTRAVENOUS

## 2019-03-26 MED ORDER — LIDOCAINE 2% (20 MG/ML) 5 ML SYRINGE
INTRAMUSCULAR | Status: DC | PRN
  Administered 2019-03-26: 100 mg via INTRAVENOUS

## 2019-03-26 MED ORDER — SUGAMMADEX SODIUM 500 MG/5ML IV SOLN
INTRAVENOUS | Status: AC
  Filled 2019-03-26: qty 5

## 2019-03-26 MED ORDER — SUCCINYLCHOLINE CHLORIDE 200 MG/10ML IV SOSY
PREFILLED_SYRINGE | INTRAVENOUS | Status: AC
  Filled 2019-03-26: qty 10

## 2019-03-26 MED ORDER — SODIUM CHLORIDE 0.9% FLUSH: 3.0000 mL | Freq: Two times a day (BID) | INTRAVENOUS | Status: DC

## 2019-03-26 MED ORDER — PROPOFOL 10 MG/ML IV BOLUS
INTRAVENOUS | Status: AC
  Filled 2019-03-26: qty 20

## 2019-03-26 MED ORDER — BUPIVACAINE HCL (PF) 0.25 % IJ SOLN
INTRAMUSCULAR | Status: DC | PRN
  Administered 2019-03-26: 30 mL

## 2019-03-26 MED ORDER — STERILE WATER FOR IRRIGATION IR SOLN
Status: DC | PRN
  Administered 2019-03-26: 1000 mL

## 2019-03-26 MED ORDER — LIDOCAINE 2% (20 MG/ML) 5 ML SYRINGE
INTRAMUSCULAR | Status: DC | PRN
  Administered 2019-03-26: 1.5 mg/kg/h via INTRAVENOUS

## 2019-03-26 MED ORDER — ACETAMINOPHEN 500 MG PO TABS
1000.0000 mg | ORAL_TABLET | ORAL | Status: AC
  Administered 2019-03-26: 1000 mg via ORAL
  Filled 2019-03-26: qty 2

## 2019-03-26 MED ORDER — LACTATED RINGERS IV SOLN
INTRAVENOUS | Status: DC
  Administered 2019-03-26: 07:00:00 via INTRAVENOUS

## 2019-03-26 MED ORDER — BUPIVACAINE HCL (PF) 0.25 % IJ SOLN
INTRAMUSCULAR | Status: AC
  Filled 2019-03-26: qty 30

## 2019-03-26 MED ORDER — KETAMINE HCL 10 MG/ML IJ SOLN
INTRAMUSCULAR | Status: AC
  Filled 2019-03-26: qty 1

## 2019-03-26 MED ORDER — PROPOFOL 10 MG/ML IV BOLUS
INTRAVENOUS | Status: DC | PRN
  Administered 2019-03-26: 150 mg via INTRAVENOUS

## 2019-03-26 MED ORDER — LACTATED RINGERS IR SOLN
Status: DC | PRN
  Administered 2019-03-26: 1000 mL

## 2019-03-26 MED ORDER — ROCURONIUM BROMIDE 10 MG/ML (PF) SYRINGE
PREFILLED_SYRINGE | INTRAVENOUS | Status: AC
  Filled 2019-03-26: qty 10

## 2019-03-26 MED ORDER — ONDANSETRON HCL 4 MG/2ML IJ SOLN
INTRAMUSCULAR | Status: DC | PRN
  Administered 2019-03-26: 4 mg via INTRAVENOUS

## 2019-03-26 MED ORDER — DEXAMETHASONE SODIUM PHOSPHATE 4 MG/ML IJ SOLN: 4.0000 mg | INTRAMUSCULAR | Status: DC

## 2019-03-26 MED ORDER — KETAMINE HCL 10 MG/ML IJ SOLN
INTRAMUSCULAR | Status: DC | PRN
  Administered 2019-03-26: 30 mg via INTRAVENOUS

## 2019-03-26 MED ORDER — ONDANSETRON HCL 4 MG/2ML IJ SOLN
INTRAMUSCULAR | Status: AC
  Filled 2019-03-26: qty 2

## 2019-03-26 MED ORDER — FENTANYL CITRATE (PF) 250 MCG/5ML IJ SOLN
INTRAMUSCULAR | Status: DC | PRN
  Administered 2019-03-26: 50 ug via INTRAVENOUS
  Administered 2019-03-26: 100 ug via INTRAVENOUS
  Administered 2019-03-26 (×2): 50 ug via INTRAVENOUS

## 2019-03-26 SURGICAL SUPPLY — 46 items
BAG SPEC RTRVL LRG 6X4 10 (ENDOMECHANICALS) ×2
COVER BACK TABLE 60X90IN (DRAPES) IMPLANT
COVER TIP SHEARS 8 DVNC (MISCELLANEOUS) ×1 IMPLANT
COVER TIP SHEARS 8MM DA VINCI (MISCELLANEOUS) ×1
COVER WAND RF STERILE (DRAPES) IMPLANT
DECANTER SPIKE VIAL GLASS SM (MISCELLANEOUS) ×1 IMPLANT
DRAPE ARM DVNC X/XI (DISPOSABLE) ×4 IMPLANT
DRAPE COLUMN DVNC XI (DISPOSABLE) ×1 IMPLANT
DRAPE DA VINCI XI ARM (DISPOSABLE) ×4
DRAPE DA VINCI XI COLUMN (DISPOSABLE) ×1
DRAPE SHEET LG 3/4 BI-LAMINATE (DRAPES) ×2 IMPLANT
DRAPE SURG IRRIG POUCH 19X23 (DRAPES) ×2 IMPLANT
ELECT REM PT RETURN 15FT ADLT (MISCELLANEOUS) ×2 IMPLANT
GLOVE BIO SURGEON STRL SZ 6.5 (GLOVE) ×4 IMPLANT
GLOVE BIO SURGEON STRL SZ7.5 (GLOVE) ×6 IMPLANT
GLOVE INDICATOR 8.0 STRL GRN (GLOVE) ×4 IMPLANT
GOWN STRL REUS W/ TWL LRG LVL3 (GOWN DISPOSABLE) ×1 IMPLANT
GOWN STRL REUS W/TWL LRG LVL3 (GOWN DISPOSABLE) ×2
GOWN STRL REUS W/TWL XL LVL3 (GOWN DISPOSABLE) ×4 IMPLANT
HOLDER FOLEY CATH W/STRAP (MISCELLANEOUS) ×2 IMPLANT
IRRIG SUCT STRYKERFLOW 2 WTIP (MISCELLANEOUS) ×2
IRRIGATION SUCT STRKRFLW 2 WTP (MISCELLANEOUS) ×1 IMPLANT
KIT PROCEDURE DA VINCI SI (MISCELLANEOUS)
KIT PROCEDURE DVNC SI (MISCELLANEOUS) IMPLANT
KIT TURNOVER KIT A (KITS) IMPLANT
MANIPULATOR UTERINE 4.5 ZUMI (MISCELLANEOUS) IMPLANT
NDL SPNL 18GX3.5 QUINCKE PK (NEEDLE) IMPLANT
NEEDLE HYPO 22GX1.5 SAFETY (NEEDLE) ×2 IMPLANT
NEEDLE SPNL 18GX3.5 QUINCKE PK (NEEDLE) IMPLANT
PACK ROBOT GYN CUSTOM WL (TRAY / TRAY PROCEDURE) ×2 IMPLANT
PAD POSITIONING PINK XL (MISCELLANEOUS) ×2 IMPLANT
PORT ACCESS TROCAR AIRSEAL 12 (TROCAR) ×1 IMPLANT
PORT ACCESS TROCAR AIRSEAL 5M (TROCAR) ×1
POUCH SPECIMEN RETRIEVAL 10MM (ENDOMECHANICALS) ×2 IMPLANT
SEAL CANN UNIV 5-8 DVNC XI (MISCELLANEOUS) ×4 IMPLANT
SEAL XI 5MM-8MM UNIVERSAL (MISCELLANEOUS) ×4
SET TRI-LUMEN FLTR TB AIRSEAL (TUBING) ×2 IMPLANT
SUT VIC AB 0 CT1 27 (SUTURE)
SUT VIC AB 0 CT1 27XBRD ANTBC (SUTURE) IMPLANT
SUT VLOC 180 0 9IN  GS21 (SUTURE)
SUT VLOC 180 0 9IN GS21 (SUTURE) IMPLANT
TOWEL OR NON WOVEN STRL DISP B (DISPOSABLE) ×2 IMPLANT
TRAP SPECIMEN MUCOUS 40CC (MISCELLANEOUS) ×1 IMPLANT
TRAY FOLEY MTR SLVR 16FR STAT (SET/KITS/TRAYS/PACK) ×2 IMPLANT
UNDERPAD 30X30 (UNDERPADS AND DIAPERS) ×2 IMPLANT
WATER STERILE IRR 1000ML POUR (IV SOLUTION) ×2 IMPLANT

## 2019-03-26 NOTE — Anesthesia Preprocedure Evaluation (Signed)
Anesthesia Evaluation  Patient identified by MRN, date of birth, ID band Patient awake    Reviewed: Allergy & Precautions, H&P , NPO status , Patient's Chart, lab work & pertinent test results  Airway Mallampati: II   Neck ROM: full    Dental   Pulmonary former smoker,    breath sounds clear to auscultation       Cardiovascular hypertension,  Rhythm:regular Rate:Normal     Neuro/Psych    GI/Hepatic GERD  ,H/o achalasia   Endo/Other  obese  Renal/GU      Musculoskeletal   Abdominal   Peds  Hematology   Anesthesia Other Findings   Reproductive/Obstetrics BRCA2 +                             Anesthesia Physical Anesthesia Plan  ASA: II  Anesthesia Plan: General   Post-op Pain Management:    Induction: Intravenous  PONV Risk Score and Plan: 3 and Ondansetron, Dexamethasone, Midazolam and Treatment may vary due to age or medical condition  Airway Management Planned: Oral ETT  Additional Equipment:   Intra-op Plan:   Post-operative Plan: Extubation in OR  Informed Consent: I have reviewed the patients History and Physical, chart, labs and discussed the procedure including the risks, benefits and alternatives for the proposed anesthesia with the patient or authorized representative who has indicated his/her understanding and acceptance.       Plan Discussed with: CRNA, Anesthesiologist and Surgeon  Anesthesia Plan Comments:         Anesthesia Quick Evaluation

## 2019-03-26 NOTE — Op Note (Addendum)
OPERATIVE REPORT  Pre-operative Diagnosis: Deleterious BRCA2 mutation, breast cancer  Post-operative Diagnosis:  Deleterious BRCA2 mutation, breast cancer  Operation: Risk reducing robotic-assisted laparoscopic, bilateral salpingoophorectomy, washings, peritoneal biopsy  Surgeon: Janie Morning MD., PhD  Assistant Surgeon:Lisa Delsa Sale MD, Joylene John NP  Anesthesia: GET  Urine Output: 80cc  Operative Findings: 1.  No intra-peritoneal disease 2.  Normal adnexa 3.  Peritoneal scarring 4.  Normal appendix    Procedure Details  The patient was seen in the Holding Room. The risks, benefits, complications, treatment options, and expected outcomes were discussed with the patient.  The patient concurred with the proposed plan, giving informed consent.  The site of surgery properly noted/marked. The patient was identified as [name] and the procedure verified as a risk reducing robotic-assisted  with bilateral salpingo oophorectomy. A Time Out was held and the above information confirmed.  After induction of anesthesia, the patient was draped and prepped in the usual sterile manner. Pt was placed in supine position after anesthesia and draped and prepped in the usual sterile manner. The abdominal drape was placed after the CholoraPrep had been allowed to dry for 3 minutes.  Her arms were tucked to her side with all appropriate precautions.  The chest was secured to the table.  The patient was placed in the semi-lithotomy position in [place].  The perineum was prepped with Betadine.  Foley catheter was placed. A sponge stick was placed in the vagina.  A second time-out was performed.  OG tube placement was confirmed and to suction.   Procedure:  The patient was brought to the operating room where general anesthesia was administered with no complications.  The patient was placed in the dorsal lithotomy position in padded Allen stirrups.  The arms were tucked at the sides with gel  pads protecting the elbows and foam protecting the hands. The patient was then prepped.   The patient was then draped in the normal manner.  Next, a 5 mm skin incision was made 1 cm below the subcostal margin in the midclavicular line.  The 5 mm Optiview port and scope was used for direct entry.  Opening pressure was under 10 mm CO2.  The abdomen was insufflated and the findings were noted as above.   At this point and all points during the procedure, the patient's intra-abdominal pressure did not exceed 15 mmHg. Next, a 10 mm skin incision was inferior to the umbilicus and a right and two  left ports were placed about 7 cm lateral  On the left and 7cm and 15 cm lateral to the umbilicus on the left.    All ports were placed under direct visualization, marcaine was injected at the incisions sites prior to incision.  The patient was placed in steep Trendelenburg.  Bowel was suboptimally packed  away into the upper abdomen.  The robot was docked in the normal manner. Pelvic washings were collected.  The right retroperitoneum was entered and the pararectal space was developed.  The ureter was noted to be on the medial leaf of the broad ligament.  The peritoneum above the ureter was incised and stretched and the infundibulopelvic ligament was skeletonized, cauterized and cut over 2cm from the ovarium hilum.  A similar procedure was performed on the left and the specimens were placed in an endoscopic bag.  Pedicles were inspected and excellent hemostasis was achieved.    At this point in the procedure was completed.  Robotic instruments were removed under direct visulaization.  The robot was undocked. The specimens were delivered through the left upper quadrant port site. The 13 mm ports were closed with Vicryl on a UR-5 needle and the fascia was closed with 0 Vicryl on a UR-5 needle.  The skin was closed with 4-0 Vicryl in a subcuticular manner and dermabond was applied.  Sponge, lap and needle counts correct x 2.   The patient was taken to the recovery room in stable condition.  The vagina was swabbed with  minimal bleeding noted.   All instrument and needle counts were correct x  3.   The patient was transferred to the recovery room in stable condition.  Estimated Blood Loss:  Minimal              Specimens: PATHOLOGY bilateral tubes and ovaries, pelvic washings, peritoneal biopsy         Complications:  None; patient tolerated the procedure well.         Disposition: PACU - hemodynamically stable.

## 2019-03-26 NOTE — Anesthesia Procedure Notes (Signed)
Procedure Name: Intubation Date/Time: 03/26/2019 7:38 AM Performed by: Maxwell Caul, CRNA Pre-anesthesia Checklist: Patient identified, Emergency Drugs available, Suction available and Patient being monitored Patient Re-evaluated:Patient Re-evaluated prior to induction Oxygen Delivery Method: Circle system utilized Preoxygenation: Pre-oxygenation with 100% oxygen Induction Type: IV induction Ventilation: Mask ventilation without difficulty Laryngoscope Size: Mac and 4 Grade View: Grade II Tube type: Oral Tube size: 7.5 mm Number of attempts: 1 Airway Equipment and Method: Stylet Placement Confirmation: ETT inserted through vocal cords under direct vision,  positive ETCO2 and breath sounds checked- equal and bilateral Secured at: 22 cm Tube secured with: Tape Dental Injury: Teeth and Oropharynx as per pre-operative assessment

## 2019-03-26 NOTE — Interval H&P Note (Signed)
History and Physical Interval Note:  03/26/2019 7:22 AM  [name]  has presented today for surgery, with the diagnosis of BRCA 2 MUTATION.  The various methods of treatment have been discussed with the patient and family. After consideration of risks, benefits and other options for treatment, the patient has consented to  Procedure(s): ROBOTIC ASSISTED SALPINGO OOPHORECTOMY (Bilateral) as a surgical intervention.  The patient's history has been reviewed, patient examined, no change in status, stable for surgery.  I have reviewed the patient's chart and labs.  Questions were answered to the patient's satisfaction.     Janie Morning

## 2019-03-26 NOTE — Discharge Instructions (Signed)
Return to work: 3 days to 2 weeks.  Activity: 1. Be up and out of the bed during the day.  Take a nap if needed.  You may walk up steps but be careful and use the hand rail.  Stair climbing will tire you more than you think, you may need to stop part way and rest.   2. No lifting or straining for 4 weeks.  3. No driving for 1 weeks.  Do Not drive if you are taking narcotic pain medicine.  4. Shower daily.  Use soap and water on your incision and pat dry; don't rub.   5. No sexual activity and nothing in the vagina for 4 - 6 weeks.  Medications:  - Take ibuprofen and tylenol first line for pain control. Take these regularly (every 6 hours) to decrease the build up of pain.  - If necessary, for severe pain not relieved by ibuprofen, contact Dr Leone Brand office and you will be prescribed percocet.  - While taking percocet you should take sennakot every night to reduce the likelihood of constipation. If this causes diarrhea, stop its use.  Diet: 1. Low sodium Heart Healthy Diet is recommended.  2. It is safe to use a laxative if you have difficulty moving your bowels.   Wound Care: 1. Keep clean and dry.  Shower daily.  Reasons to call the Doctor:   Fever - Oral temperature greater than 100.4 degrees Fahrenheit  Foul-smelling vaginal discharge  Difficulty urinating  Nausea and vomiting  Increased pain at the site of the incision that is unrelieved with pain medicine.  Difficulty breathing with or without chest pain  New calf pain especially if only on one side  Sudden, continuing increased vaginal bleeding with or without clots.   Follow-up: 1. See Everitt Amber in 4 weeks.  Contacts: For questions or concerns you should contact:  Dr. Janie Morning at [phone] After hours and on week-ends call [phone] and ask to speak to the physician on call for Gynecologic OncologyReturn to work: 4 weeks (2 weeks with physical restrictions).  Activity: 1. Be up and out  of the bed during the day.  Take a nap if needed.  You may walk up steps but be careful and use the hand rail.  Stair climbing will tire you more than you think, you may need to stop part way and rest.   2. No lifting or straining for 4 weeks.  3. No driving for 1 weeks.  Do Not drive if you are taking narcotic pain medicine.  4. Shower daily.  Use soap and water on your incision and pat dry; don't rub.   5. No sexual activity and nothing in the vagina for 8 weeks.  Medications:  - Take ibuprofen and tylenol first line for pain control. Take these regularly (every 6 hours) to decrease the build up of pain.  - If necessary, for severe pain not relieved by ibuprofen, contact Dr Serita Grit office and you will be prescribed percocet.  - While taking percocet you should take sennakot every night to reduce the likelihood of constipation. If this causes diarrhea, stop its use.  Diet: 1. Low sodium Heart Healthy Diet is recommended.  2. It is safe to use a laxative if you have difficulty moving your bowels.   Wound Care: 1. Keep clean and dry.  Shower daily.  Reasons to call the Doctor:   Fever - Oral temperature greater than 100.4 degrees Fahrenheit  Foul-smelling vaginal discharge  Difficulty  urinating  Nausea and vomiting  Increased pain at the site of the incision that is unrelieved with pain medicine.  Difficulty breathing with or without chest pain  New calf pain especially if only on one side  Sudden, continuing increased vaginal bleeding with or without clots.   Follow-up: 1. See Everitt Amber in 4 weeks.  Contacts: For questions or concerns you should contact:  Dr. Everitt Amber at [phone] After hours and on week-ends call [phone] and ask to speak to the physician on call for Gynecologic Oncology   Bilateral Salpingo-Oophorectomy, Care After This sheet gives you information about how to care for yourself after your procedure. Your health care provider may also  give you more specific instructions. If you have problems or questions, contact your health care provider. What can I expect after the procedure? After the procedure, it is common to have:  Abdominal pain.  Some occasional vaginal bleeding (spotting).  Tiredness.  Symptoms of menopause, such as hot flashes, night sweats, or mood swings. Follow these instructions at home: Incision care   Keep your incision area and your bandage (dressing) clean and dry.  Follow instructions from your health care provider about how to take care of your incision. Make sure you: ? Wash your hands with soap and water before you change your dressing. If soap and water are not available, use hand sanitizer. ? Change your dressing as told by your health care provider. ? Leave stitches (sutures), staples, skin glue, or adhesive strips in place. These skin closures may need to stay in place for 2 weeks or longer. If adhesive strip edges start to loosen and curl up, you may trim the loose edges. Do not remove adhesive strips completely unless your health care provider tells you to do that.  Check your incision area every day for signs of infection. Check for: ? Redness, swelling, or pain. ? Fluid or blood. ? Warmth. ? Pus or a bad smell. Activity   Do not drive or use heavy machinery while taking prescription pain medicine.  Do not drive for 24 hours if you received a medicine to help you relax (sedative) during your procedure.  Take frequent, short walks throughout the day. Rest when you get tired. Ask your health care provider what activities are safe for you.  Avoid activity that requires great effort. Also, avoid heavy lifting. Do not lift anything that is heavier than 10 lbs. (4.5 kg), or the limit that your health care provider tells you, until he or she says that it is safe to do so.  Do not douche, use tampons, or have sex until your health care provider approves. General instructions   To  prevent or treat constipation while you are taking prescription pain medicine, your health care provider may recommend that you: ? Drink enough fluid to keep your urine clear or pale yellow. ? Take over-the-counter or prescription medicines. ? Eat foods that are high in fiber, such as fresh fruits and vegetables, whole grains, and beans. ? Limit foods that are high in fat and processed sugars, such as fried and sweet foods.  Take over-the-counter and prescription medicines only as told by your health care provider.  Do not take baths, swim, or use a hot tub until your health care provider approves. Ask your health care provider if you can take showers. You may only be allowed to take sponge baths for bathing.  Wear compression stockings as told by your health care provider. These stockings help  to prevent blood clots and reduce swelling in your legs.  Keep all follow-up visits as told by your health care provider. This is important. Contact a health care provider if:  You have pain when you urinate.  You have pus or a bad smelling discharge coming from your vagina.  You have redness, swelling, or pain around your incision.  You have fluid or blood coming from your incision.  Your incision feels warm to the touch.  You have pus or a bad smell coming from your incision.  You have a fever.  Your incision starts to break open.  You have pain in the abdomen, and it gets worse or does not get better when you take medicine.  You develop a rash.  You develop nausea and vomiting.  You feel lightheaded. Get help right away if:  You develop pain in your chest or leg.  You become short of breath.  You faint.  You have increased bleeding from your vagina. Summary  After the procedure, it is common to have pain, bleeding in the vagina, and symptoms of menopause.  Follow instructions from your health care provider about how to take care of your incision.  Follow instructions  from your health care provider about activities and restrictions.  Check your incision every day for signs of infection and report any symptoms to your health care provider. This information is not intended to replace advice given to you by your health care provider. Make sure you discuss any questions you have with your health care provider. Document Released: 08/27/2005 Document Revised: 10/31/2018 Document Reviewed: 10/01/2016 Elsevier Patient Education  2020 [employer].

## 2019-03-26 NOTE — Transfer of Care (Signed)
Immediate Anesthesia Transfer of Care Note  Patient: [name]  Procedure(s) Performed: ROBOTIC ASSISTED SALPINGO OOPHORECTOMY (Bilateral )  Patient Location: PACU  Anesthesia Type:General  Level of Consciousness: awake, alert  and oriented  Airway & Oxygen Therapy: Patient Spontanous Breathing and Patient connected to face mask oxygen  Post-op Assessment: Report given to RN and Post -op Vital signs reviewed and stable  Post vital signs: Reviewed and stable  Last Vitals:  Vitals Value Taken Time  BP 164/101 03/26/19 0906  Temp    Pulse 87 03/26/19 0908  Resp 17 03/26/19 0908  SpO2 100 % 03/26/19 0908  Vitals shown include unvalidated device data.  Last Pain:  Vitals:   03/26/19 0906  TempSrc:   PainSc: (P) 0-No pain         Complications: No apparent anesthesia complications

## 2019-03-27 ENCOUNTER — Telehealth: Payer: Self-pay | Admitting: Oncology

## 2019-03-27 ENCOUNTER — Encounter (HOSPITAL_COMMUNITY): Payer: Self-pay | Admitting: Gynecologic Oncology

## 2019-03-27 NOTE — Telephone Encounter (Signed)
Called [name] to see how she is feeling after surgery. She said she is feeling ok today and that she had some trouble sleeping last night.  She took an oxycodone left over from her breast surgery and was able to sleep.  She has some mild pain in her back today.  Advised her of her benign pathology results.  She verbalized understanding and agreement.  Advised her to call with any questions or concerns.

## 2019-03-27 NOTE — Anesthesia Postprocedure Evaluation (Signed)
Anesthesia Post Note  Patient: [name]  Procedure(s) Performed: ROBOTIC ASSISTED SALPINGO OOPHORECTOMY (Bilateral )     Patient location during evaluation: PACU Anesthesia Type: General Level of consciousness: awake and alert Pain management: pain level controlled Vital Signs Assessment: post-procedure vital signs reviewed and stable Respiratory status: spontaneous breathing, nonlabored ventilation, respiratory function stable and patient connected to nasal cannula oxygen Cardiovascular status: blood pressure returned to baseline and stable Postop Assessment: no apparent nausea or vomiting Anesthetic complications: no    Last Vitals:  Vitals:   03/26/19 0959 03/26/19 1015  BP: (!) 166/102 (!) 144/88  Pulse: 69 67  Resp: 18 16  Temp: 36.6 C   SpO2: 100% 96%    Last Pain:  Vitals:   03/26/19 0959  TempSrc:   PainSc: 0-No pain   Pain Goal:                   Wladyslaw Henrichs S

## 2019-03-31 ENCOUNTER — Telehealth: Payer: Self-pay | Admitting: *Deleted

## 2019-03-31 DIAGNOSIS — B3731 Acute candidiasis of vulva and vagina: Secondary | ICD-10-CM

## 2019-03-31 DIAGNOSIS — B373 Candidiasis of vulva and vagina: Secondary | ICD-10-CM

## 2019-03-31 MED ORDER — FLUCONAZOLE 100 MG PO TABS: 100.0000 mg | ORAL_TABLET | Freq: Every day | ORAL | 0 refills | Status: DC

## 2019-03-31 NOTE — Telephone Encounter (Signed)
Patient called and left a message stating "I had surgery a week ago Thursday. I believe I have a yeast infection, can I get a prescription to take of this." Message forwarded to the desk RN

## 2019-03-31 NOTE — Telephone Encounter (Signed)
Onset of vaginal itching and thick, white, and  Cheesy discharge began yesterday 03-29-19. Pt would like to have a yeast medication sent in to her pharmacy.

## 2019-03-31 NOTE — Telephone Encounter (Signed)
Told Ms Kolker that [facility] sent in a tablet of diflucan 100 mg x 1 dose to her pharmacy.

## 2019-04-01 NOTE — H&P (Signed)
Subjective:     Patient ID: [name] is a 48 y.o. female.  HPI  Nearly 6 weeks post op. Underwent BSO last week. Scheduled for implant exchange next month.   Presented with palpable right breast mass. Screening MMG showed right breast calcifications and possible left breast mass. Diagnostic MMG showed 6 mm group of calcifications within the right UOQ . 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  MRI demonstrated 9 mm mass within the upper-outer right breast anterior depth which may represent intramammary LN. Adjacent 5 mm mass which may represent intramammary LN . Possibility of malignancy at this location is not excluded.   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 Desires no larger. Right mastectomy 1309 g left 1266 g  Works as Cabin crew. Husband is a Administrator, states home every night. Two kids, daughter is Presenter, broadcasting at [employer].  Review of Systems     Objective:   Physical Exam  Cardiovascular: Normal rate, regular rhythm and normal heart sounds.  Pulmonary/Chest: Effort normal and breath sounds normal.  Abdominal: Soft.  No hernias  striae central abdomen, multiple scars from BSO and prior hysterectomy Chest: scars maturing, all areas healed, Soft bilateral expanded depression contour right axilla, smaller on left Fullness soft tissue lateral chest wall. No visible rippling    Assessment:     DCIS right breast BRCA2 S/p bilateral SRM, Right SLN, bilateral prepectoral TE/ADM (Alloderm) reconstruction    Plan:     Pictures today.   Plan removal bilateral TE, placement implants, possible lipofilling.  Reviewed the lateral chest wall fullness- plan liposcution to these areas to aid with contour.  Reviewed saline vs silicone, shaped vs round. HCG or capacity filled silicone implants  may offer reduced risk visible rippling. Reviewed MRI or USsurveillance for rupture with silicone implants. Reviewed no implant is permanent device and may require additional surgery.Reviewed examples for 4th generation, capacity filled 4th generation, and HCG implants vs saline implants. Reviewed risks AP flipping that may be more noticeable with 5th generation implants, may require surgery to correct. Discussed texturing and risk ALCL with this. Plan silicone, smooth round capacity filled.   Reviewed size of implant guided in part by BW chest, will be larger than present in expanders but smaller than native breasts.  Reviewed purpose fat grafting. Reviewed variable take graft, fat necrosis that presents as masses.Reviewed donor site pain, need for compression. She does not have rippling presently but may benefit axillary depression greater on right. Plan flanks donor site, will utilize prior abdominal scars. She has Spanx type compression garment at home for post op use.  Plan OP surgery, no drains anticipated.   Discussed risk COVID infectionthrough this elective surgery. Patient will receive COVID testing prior to surgery. Discussed even if patient receivesa negative test result, the tests in some cases may fail to detect the virus or patient maycontract COVID after the test.COVID 19 infectionbefore/during/aftersurgery may result in lead to a higher chance of complication and death.  Rx  For Bactrim given. She has oxycodone and Robaxin at home.  Natrelle 133S FV-13-T 500 ml tissue expanders placed bilateral   RIGHT fill volume 485 ml saline LEFT fill volume 500 ml saline   Irene Limbo, MD [facility] Plastic & Reconstructive Surgery [phone], pin [phone]

## 2019-04-14 ENCOUNTER — Telehealth: Payer: Self-pay | Admitting: *Deleted

## 2019-04-14 NOTE — Telephone Encounter (Signed)
This RN spoke with pt per her inquiry of use of " herbal remedy for hot flashes ".  This RN discussed above - with goal in her situation is to not mimic estrogen or use an herbal supplement that unknowingly could be absorbed like an estrogen due to her DCIS/BRCA estrogen positive history.  Discussed use of black cohosh, paced breathing, yoga and AccuPressure which non pharmacological interventions.  But also advised that due to upcoming reconstruction surgery ideal to not start any supplements until post recovery.  This RN answered questions as well as informed pt further discussion of concerns can be addressed at upcoming visit- though she can call if needed.  No further issues at this time.

## 2019-04-16 ENCOUNTER — Other Ambulatory Visit: Payer: Self-pay

## 2019-04-21 ENCOUNTER — Telehealth: Payer: Self-pay | Admitting: *Deleted

## 2019-04-21 ENCOUNTER — Other Ambulatory Visit (HOSPITAL_COMMUNITY)
Admission: RE | Admit: 2019-04-21 | Discharge: 2019-04-21 | Disposition: A | Discharge status: Home / Self Care | Payer: Managed Care, Other (non HMO) | Source: Ambulatory Visit | Attending: Plastic Surgery | Admitting: Plastic Surgery

## 2019-04-21 DIAGNOSIS — Z20828 Contact with and (suspected) exposure to other viral communicable diseases: Secondary | ICD-10-CM | POA: Insufficient documentation

## 2019-04-21 DIAGNOSIS — Z01812 Encounter for preprocedural laboratory examination: Secondary | ICD-10-CM | POA: Diagnosis not present

## 2019-04-21 LAB — SARS CORONAVIRUS 2 (TAT 6-24 HRS): SARS Coronavirus 2: NEGATIVE

## 2019-04-21 NOTE — Telephone Encounter (Signed)
Called and spoke with the patient. Moved her appt from this week to next week

## 2019-04-23 ENCOUNTER — Ambulatory Visit: Payer: Managed Care, Other (non HMO) | Admitting: Gynecologic Oncology

## 2019-04-23 NOTE — Anesthesia Preprocedure Evaluation (Signed)
Anesthesia Evaluation  Patient identified by MRN, date of birth, ID band Patient awake    Reviewed: Allergy & Precautions, H&P , NPO status , Patient's Chart, lab work & pertinent test results  Airway Mallampati: II   Neck ROM: full    Dental   Pulmonary former smoker,    breath sounds clear to auscultation       Cardiovascular hypertension,  Rhythm:regular Rate:Normal     Neuro/Psych    GI/Hepatic GERD  ,H/o achalasia   Endo/Other  obese  Renal/GU      Musculoskeletal   Abdominal   Peds  Hematology  (+) Blood dyscrasia, anemia ,   Anesthesia Other Findings   Reproductive/Obstetrics BRCA2 +                             Anesthesia Physical  Anesthesia Plan  ASA: III  Anesthesia Plan: General   Post-op Pain Management:    Induction: Intravenous  PONV Risk Score and Plan: 3 and Ondansetron, Dexamethasone, Midazolam and Treatment may vary due to age or medical condition  Airway Management Planned: Oral ETT and LMA  Additional Equipment:   Intra-op Plan:   Post-operative Plan: Extubation in OR  Informed Consent: I have reviewed the patients History and Physical, chart, labs and discussed the procedure including the risks, benefits and alternatives for the proposed anesthesia with the patient or authorized representative who has indicated his/her understanding and acceptance.       Plan Discussed with: CRNA, Anesthesiologist and Surgeon  Anesthesia Plan Comments: ( )        Anesthesia Quick Evaluation

## 2019-04-23 NOTE — Progress Notes (Signed)

## 2019-04-24 ENCOUNTER — Ambulatory Visit (HOSPITAL_BASED_OUTPATIENT_CLINIC_OR_DEPARTMENT_OTHER)
Admission: RE | Admit: 2019-04-24 | Discharge: 2019-04-24 | Disposition: A | Discharge status: Home / Self Care | Payer: Managed Care, Other (non HMO) | Attending: Plastic Surgery | Admitting: Plastic Surgery

## 2019-04-24 ENCOUNTER — Other Ambulatory Visit: Payer: Self-pay

## 2019-04-24 ENCOUNTER — Ambulatory Visit (HOSPITAL_BASED_OUTPATIENT_CLINIC_OR_DEPARTMENT_OTHER): Payer: Managed Care, Other (non HMO) | Admitting: Anesthesiology

## 2019-04-24 ENCOUNTER — Encounter (HOSPITAL_BASED_OUTPATIENT_CLINIC_OR_DEPARTMENT_OTHER): Payer: Self-pay

## 2019-04-24 ENCOUNTER — Encounter (HOSPITAL_BASED_OUTPATIENT_CLINIC_OR_DEPARTMENT_OTHER)
Admission: RE | Disposition: A | Discharge status: Home / Self Care | Payer: Self-pay | Source: Home / Self Care | Attending: Plastic Surgery

## 2019-04-24 DIAGNOSIS — Z853 Personal history of malignant neoplasm of breast: Secondary | ICD-10-CM | POA: Diagnosis not present

## 2019-04-24 DIAGNOSIS — Z6836 Body mass index (BMI) 36.0-36.9, adult: Secondary | ICD-10-CM | POA: Diagnosis not present

## 2019-04-24 DIAGNOSIS — I10 Essential (primary) hypertension: Secondary | ICD-10-CM | POA: Insufficient documentation

## 2019-04-24 DIAGNOSIS — E669 Obesity, unspecified: Secondary | ICD-10-CM | POA: Insufficient documentation

## 2019-04-24 DIAGNOSIS — K219 Gastro-esophageal reflux disease without esophagitis: Secondary | ICD-10-CM | POA: Insufficient documentation

## 2019-04-24 DIAGNOSIS — Z87891 Personal history of nicotine dependence: Secondary | ICD-10-CM | POA: Insufficient documentation

## 2019-04-24 DIAGNOSIS — Z9013 Acquired absence of bilateral breasts and nipples: Secondary | ICD-10-CM | POA: Insufficient documentation

## 2019-04-24 DIAGNOSIS — Z421 Encounter for breast reconstruction following mastectomy: Secondary | ICD-10-CM | POA: Diagnosis present

## 2019-04-24 DIAGNOSIS — D649 Anemia, unspecified: Secondary | ICD-10-CM | POA: Insufficient documentation

## 2019-04-24 HISTORY — PX: LIPOSUCTION WITH LIPOFILLING: SHX6436

## 2019-04-24 HISTORY — PX: REMOVAL OF BILATERAL TISSUE EXPANDERS WITH PLACEMENT OF BILATERAL BREAST IMPLANTS: SHX6431

## 2019-04-24 SURGERY — REMOVAL, TISSUE EXPANDER, BREAST, BILATERAL, WITH BILATERAL IMPLANT IMPLANT INSERTION
Anesthesia: General | Site: Chest | Laterality: Bilateral

## 2019-04-24 MED ORDER — SODIUM CHLORIDE 0.9 % IV SOLN
INTRAVENOUS | Status: DC | PRN
  Administered 2019-04-24: 1000 mL

## 2019-04-24 MED ORDER — DEXAMETHASONE SODIUM PHOSPHATE 10 MG/ML IJ SOLN
INTRAMUSCULAR | Status: AC
  Filled 2019-04-24: qty 1

## 2019-04-24 MED ORDER — LACTATED RINGERS IV SOLN
INTRAVENOUS | Status: DC
  Administered 2019-04-24 (×2): via INTRAVENOUS

## 2019-04-24 MED ORDER — EPHEDRINE SULFATE-NACL 50-0.9 MG/10ML-% IV SOSY
PREFILLED_SYRINGE | INTRAVENOUS | Status: DC | PRN
  Administered 2019-04-24: 10 mg via INTRAVENOUS

## 2019-04-24 MED ORDER — CHLORHEXIDINE GLUCONATE CLOTH 2 % EX PADS: 6.0000 | MEDICATED_PAD | Freq: Once | CUTANEOUS | Status: DC

## 2019-04-24 MED ORDER — EPINEPHRINE PF 1 MG/ML IJ SOLN
INTRAMUSCULAR | Status: AC
  Filled 2019-04-24: qty 1

## 2019-04-24 MED ORDER — OXYCODONE HCL 5 MG/5ML PO SOLN: 5.0000 mg | Freq: Once | ORAL | Status: AC | PRN

## 2019-04-24 MED ORDER — FENTANYL CITRATE (PF) 100 MCG/2ML IJ SOLN
INTRAMUSCULAR | Status: AC
  Filled 2019-04-24: qty 2

## 2019-04-24 MED ORDER — SODIUM BICARBONATE 4.2 % IV SOLN
INTRAVENOUS | Status: AC
  Filled 2019-04-24: qty 20

## 2019-04-24 MED ORDER — FENTANYL CITRATE (PF) 100 MCG/2ML IJ SOLN
INTRAMUSCULAR | Status: DC | PRN
  Administered 2019-04-24: 25 ug via INTRAVENOUS
  Administered 2019-04-24 (×2): 50 ug via INTRAVENOUS
  Administered 2019-04-24 (×5): 25 ug via INTRAVENOUS

## 2019-04-24 MED ORDER — PROPOFOL 10 MG/ML IV BOLUS
INTRAVENOUS | Status: DC | PRN
  Administered 2019-04-24: 200 mg via INTRAVENOUS

## 2019-04-24 MED ORDER — SCOPOLAMINE 1 MG/3DAYS TD PT72: 1.0000 | MEDICATED_PATCH | Freq: Once | TRANSDERMAL | Status: DC

## 2019-04-24 MED ORDER — CEFAZOLIN SODIUM-DEXTROSE 2-4 GM/100ML-% IV SOLN
2.0000 g | INTRAVENOUS | Status: AC
  Administered 2019-04-24: 2 g via INTRAVENOUS

## 2019-04-24 MED ORDER — ONDANSETRON HCL 4 MG/2ML IJ SOLN: 4.0000 mg | Freq: Once | INTRAMUSCULAR | Status: DC | PRN

## 2019-04-24 MED ORDER — PROPOFOL 10 MG/ML IV BOLUS
INTRAVENOUS | Status: AC
  Filled 2019-04-24: qty 20

## 2019-04-24 MED ORDER — LIDOCAINE 2% (20 MG/ML) 5 ML SYRINGE
INTRAMUSCULAR | Status: AC
  Filled 2019-04-24: qty 5

## 2019-04-24 MED ORDER — FENTANYL CITRATE (PF) 100 MCG/2ML IJ SOLN: 50.0000 ug | INTRAMUSCULAR | Status: DC | PRN

## 2019-04-24 MED ORDER — LIDOCAINE 2% (20 MG/ML) 5 ML SYRINGE
INTRAMUSCULAR | Status: DC | PRN
  Administered 2019-04-24: 80 mg via INTRAVENOUS

## 2019-04-24 MED ORDER — LIDOCAINE HCL (PF) 1 % IJ SOLN
INTRAMUSCULAR | Status: AC
  Filled 2019-04-24: qty 60

## 2019-04-24 MED ORDER — ACETAMINOPHEN 160 MG/5ML PO SOLN: 325.0000 mg | ORAL | Status: DC | PRN

## 2019-04-24 MED ORDER — OXYCODONE HCL 5 MG PO TABS
5.0000 mg | ORAL_TABLET | Freq: Once | ORAL | Status: AC | PRN
  Administered 2019-04-24: 5 mg via ORAL

## 2019-04-24 MED ORDER — SUCCINYLCHOLINE CHLORIDE 200 MG/10ML IV SOSY
PREFILLED_SYRINGE | INTRAVENOUS | Status: AC
  Filled 2019-04-24: qty 10

## 2019-04-24 MED ORDER — EPHEDRINE 5 MG/ML INJ
INTRAVENOUS | Status: AC
  Filled 2019-04-24: qty 10

## 2019-04-24 MED ORDER — GABAPENTIN 300 MG PO CAPS
300.0000 mg | ORAL_CAPSULE | ORAL | Status: AC
  Administered 2019-04-24: 300 mg via ORAL

## 2019-04-24 MED ORDER — MEPERIDINE HCL 25 MG/ML IJ SOLN: 6.2500 mg | INTRAMUSCULAR | Status: DC | PRN

## 2019-04-24 MED ORDER — ROCURONIUM BROMIDE 10 MG/ML (PF) SYRINGE
PREFILLED_SYRINGE | INTRAVENOUS | Status: AC
  Filled 2019-04-24: qty 10

## 2019-04-24 MED ORDER — MIDAZOLAM HCL 2 MG/2ML IJ SOLN
1.0000 mg | INTRAMUSCULAR | Status: DC | PRN
  Administered 2019-04-24: 2 mg via INTRAVENOUS

## 2019-04-24 MED ORDER — BUPIVACAINE-EPINEPHRINE (PF) 0.25% -1:200000 IJ SOLN
INTRAMUSCULAR | Status: AC
  Filled 2019-04-24: qty 30

## 2019-04-24 MED ORDER — ACETAMINOPHEN 500 MG PO TABS
1000.0000 mg | ORAL_TABLET | ORAL | Status: AC
  Administered 2019-04-24: 1000 mg via ORAL

## 2019-04-24 MED ORDER — PROPOFOL 500 MG/50ML IV EMUL
INTRAVENOUS | Status: AC
  Filled 2019-04-24: qty 50

## 2019-04-24 MED ORDER — FENTANYL CITRATE (PF) 100 MCG/2ML IJ SOLN: 25.0000 ug | INTRAMUSCULAR | Status: DC | PRN

## 2019-04-24 MED ORDER — ACETAMINOPHEN 325 MG PO TABS: 325.0000 mg | ORAL_TABLET | ORAL | Status: DC | PRN

## 2019-04-24 MED ORDER — SODIUM BICARBONATE 4 % IV SOLN
INTRAVENOUS | Status: DC | PRN
  Administered 2019-04-24: 550 mL via INTRAMUSCULAR

## 2019-04-24 MED ORDER — ACETAMINOPHEN 500 MG PO TABS
ORAL_TABLET | ORAL | Status: AC
  Filled 2019-04-24: qty 2

## 2019-04-24 MED ORDER — GABAPENTIN 300 MG PO CAPS
ORAL_CAPSULE | ORAL | Status: AC
  Filled 2019-04-24: qty 1

## 2019-04-24 MED ORDER — MIDAZOLAM HCL 2 MG/2ML IJ SOLN
INTRAMUSCULAR | Status: AC
  Filled 2019-04-24: qty 2

## 2019-04-24 MED ORDER — POVIDONE-IODINE 10 % EX SOLN
CUTANEOUS | Status: DC | PRN
  Administered 2019-04-24: 1 via TOPICAL

## 2019-04-24 MED ORDER — DEXAMETHASONE SODIUM PHOSPHATE 10 MG/ML IJ SOLN
INTRAMUSCULAR | Status: DC | PRN
  Administered 2019-04-24: 10 mg via INTRAVENOUS

## 2019-04-24 MED ORDER — ONDANSETRON HCL 4 MG/2ML IJ SOLN
INTRAMUSCULAR | Status: AC
  Filled 2019-04-24: qty 8

## 2019-04-24 MED ORDER — ONDANSETRON HCL 4 MG/2ML IJ SOLN
INTRAMUSCULAR | Status: DC | PRN
  Administered 2019-04-24 (×2): 4 mg via INTRAVENOUS

## 2019-04-24 MED ORDER — ONDANSETRON HCL 4 MG/2ML IJ SOLN
INTRAMUSCULAR | Status: AC
  Filled 2019-04-24: qty 2

## 2019-04-24 MED ORDER — OXYCODONE HCL 5 MG PO TABS
ORAL_TABLET | ORAL | Status: AC
  Filled 2019-04-24: qty 1

## 2019-04-24 MED ORDER — PROPOFOL 500 MG/50ML IV EMUL
INTRAVENOUS | Status: AC
  Filled 2019-04-24: qty 100

## 2019-04-24 MED ORDER — CEFAZOLIN SODIUM-DEXTROSE 2-4 GM/100ML-% IV SOLN
INTRAVENOUS | Status: AC
  Filled 2019-04-24: qty 100

## 2019-04-24 SURGICAL SUPPLY — 85 items
ADH SKN CLS APL DERMABOND .7 (GAUZE/BANDAGES/DRESSINGS) ×4
APL PRP STRL LF DISP 70% ISPRP (MISCELLANEOUS) ×4
BAG DECANTER FOR FLEXI CONT (MISCELLANEOUS) ×3 IMPLANT
BINDER ABDOMINAL 10 UNV 27-48 (MISCELLANEOUS) IMPLANT
BINDER ABDOMINAL 12 SM 30-45 (SOFTGOODS) ×1 IMPLANT
BINDER BREAST 3XL (GAUZE/BANDAGES/DRESSINGS) IMPLANT
BINDER BREAST LRG (GAUZE/BANDAGES/DRESSINGS) IMPLANT
BINDER BREAST MEDIUM (GAUZE/BANDAGES/DRESSINGS) IMPLANT
BINDER BREAST XLRG (GAUZE/BANDAGES/DRESSINGS) IMPLANT
BINDER BREAST XXLRG (GAUZE/BANDAGES/DRESSINGS) ×1 IMPLANT
BLADE SURG 10 STRL SS (BLADE) ×6 IMPLANT
BLADE SURG 11 STRL SS (BLADE) ×3 IMPLANT
BNDG GAUZE ELAST 4 BULKY (GAUZE/BANDAGES/DRESSINGS) ×6 IMPLANT
CANISTER LIPO FAT HARVEST (MISCELLANEOUS) ×1 IMPLANT
CANISTER SUCT 1200ML W/VALVE (MISCELLANEOUS) ×4 IMPLANT
CHLORAPREP W/TINT 26 (MISCELLANEOUS) ×6 IMPLANT
COVER BACK TABLE REUSABLE LG (DRAPES) ×3 IMPLANT
COVER MAYO STAND REUSABLE (DRAPES) ×6 IMPLANT
COVER WAND RF STERILE (DRAPES) IMPLANT
DECANTER SPIKE VIAL GLASS SM (MISCELLANEOUS) IMPLANT
DERMABOND ADVANCED (GAUZE/BANDAGES/DRESSINGS) ×2
DERMABOND ADVANCED .7 DNX12 (GAUZE/BANDAGES/DRESSINGS) ×4 IMPLANT
DRAIN CHANNEL 15F RND FF W/TCR (WOUND CARE) IMPLANT
DRAPE HALF SHEET 70X43 (DRAPES) ×6 IMPLANT
DRAPE SPLIT 6X30 W/TAPE (DRAPES) ×3 IMPLANT
DRAPE TOP ARMCOVERS (MISCELLANEOUS) ×3 IMPLANT
DRAPE UTILITY XL STRL (DRAPES) ×4 IMPLANT
DRSG PAD ABDOMINAL 8X10 ST (GAUZE/BANDAGES/DRESSINGS) ×12 IMPLANT
ELECT BLADE 4.0 EZ CLEAN MEGAD (MISCELLANEOUS)
ELECT COATED BLADE 2.86 ST (ELECTRODE) ×3 IMPLANT
ELECT REM PT RETURN 9FT ADLT (ELECTROSURGICAL) ×3
ELECTRODE BLDE 4.0 EZ CLN MEGD (MISCELLANEOUS) ×2 IMPLANT
ELECTRODE REM PT RTRN 9FT ADLT (ELECTROSURGICAL) ×2 IMPLANT
EVACUATOR SILICONE 100CC (DRAIN) IMPLANT
EXTRACTOR CANIST REVOLVE STRL (CANNISTER) IMPLANT
GLOVE BIO SURGEON STRL SZ 6 (GLOVE) ×7 IMPLANT
GOWN STRL REUS W/ TWL LRG LVL3 (GOWN DISPOSABLE) ×4 IMPLANT
GOWN STRL REUS W/TWL LRG LVL3 (GOWN DISPOSABLE) ×9
IMPL BREAST GEL XFULL SHL 650 (Breast) IMPLANT
IMPL BRST GEL XFULL SHL 650CC (Breast) ×4 IMPLANT
IMPLANT BREAST GEL 650CC (Breast) ×6 IMPLANT
IV NS 500ML (IV SOLUTION)
IV NS 500ML BAXH (IV SOLUTION) IMPLANT
KIT FILL SYSTEM UNIVERSAL (SET/KITS/TRAYS/PACK) IMPLANT
LINER CANISTER 1000CC FLEX (MISCELLANEOUS) ×3 IMPLANT
MARKER SKIN DUAL TIP RULER LAB (MISCELLANEOUS) IMPLANT
NDL FILTER BLUNT 18X1 1/2 (NEEDLE) IMPLANT
NDL HYPO 25X1 1.5 SAFETY (NEEDLE) IMPLANT
NDL SAFETY ECLIPSE 18X1.5 (NEEDLE) ×2 IMPLANT
NEEDLE FILTER BLUNT 18X 1/2SAF (NEEDLE)
NEEDLE FILTER BLUNT 18X1 1/2 (NEEDLE) IMPLANT
NEEDLE HYPO 18GX1.5 SHARP (NEEDLE) ×6
NEEDLE HYPO 25X1 1.5 SAFETY (NEEDLE) IMPLANT
NS IRRIG 1000ML POUR BTL (IV SOLUTION) ×1 IMPLANT
PACK BASIN DAY SURGERY FS (CUSTOM PROCEDURE TRAY) ×3 IMPLANT
PAD ALCOHOL SWAB (MISCELLANEOUS) ×3 IMPLANT
PENCIL BUTTON HOLSTER BLD 10FT (ELECTRODE) ×3 IMPLANT
PIN SAFETY STERILE (MISCELLANEOUS) IMPLANT
PUNCH BIOPSY DERMAL 4MM (MISCELLANEOUS) IMPLANT
SIZER BREAST REUSE XFP 650CC (SIZER) ×3
SIZER BRST REUSE XFP 650CC (SIZER) IMPLANT
SLEEVE SCD COMPRESS KNEE MED (MISCELLANEOUS) ×3 IMPLANT
SPONGE LAP 18X18 RF (DISPOSABLE) ×7 IMPLANT
STAPLER VISISTAT 35W (STAPLE) ×2 IMPLANT
SUT ETHILON 2 0 FS 18 (SUTURE) IMPLANT
SUT MNCRL AB 4-0 PS2 18 (SUTURE) ×6 IMPLANT
SUT PDS AB 2-0 CT2 27 (SUTURE) IMPLANT
SUT VIC AB 3-0 PS1 18 (SUTURE)
SUT VIC AB 3-0 PS1 18XBRD (SUTURE) IMPLANT
SUT VIC AB 3-0 SH 27 (SUTURE) ×6
SUT VIC AB 3-0 SH 27X BRD (SUTURE) ×4 IMPLANT
SUT VICRYL 4-0 PS2 18IN ABS (SUTURE) ×6 IMPLANT
SYR 10ML LL (SYRINGE) ×10 IMPLANT
SYR 20ML LL LF (SYRINGE) IMPLANT
SYR 50ML LL SCALE MARK (SYRINGE) ×5 IMPLANT
SYR BULB IRRIGATION 50ML (SYRINGE) ×6 IMPLANT
SYR CONTROL 10ML LL (SYRINGE) IMPLANT
SYR TB 1ML LL NO SAFETY (SYRINGE) ×3 IMPLANT
SYRINGE TOOMEY DISP (SYRINGE) IMPLANT
TOWEL GREEN STERILE FF (TOWEL DISPOSABLE) ×6 IMPLANT
TUBE CONNECTING 20X1/4 (TUBING) ×6 IMPLANT
TUBING INFILTRATION IT-10001 (TUBING) IMPLANT
TUBING SET GRADUATE ASPIR 12FT (MISCELLANEOUS) ×3 IMPLANT
UNDERPAD 30X30 (UNDERPADS AND DIAPERS) ×6 IMPLANT
YANKAUER SUCT BULB TIP NO VENT (SUCTIONS) ×3 IMPLANT

## 2019-04-24 NOTE — Interval H&P Note (Signed)
History and Physical Interval Note:  04/24/2019 6:57 AM  [name]  has presented today for surgery, with the diagnosis of history breast cancer, acquired absence breasts.  The various methods of treatment have been discussed with the patient and family. After consideration of risks, benefits and other options for treatment, the patient has consented to  Procedure(s): REMOVAL OF BILATERAL TISSUE EXPANDERS WITH PLACEMENT OF BILATERAL SILICONE BREAST IMPLANTS (Bilateral) LIPOFILLING TO BILATERAL CHEST, LIPOSUCTION LATERAL CHEST WALL (Bilateral) as a surgical intervention.  The patient's history has been reviewed, patient examined, no change in status, stable for surgery.  I have reviewed the patient's chart and labs.  Questions were answered to the patient's satisfaction.     [name] Surabhi Gadea

## 2019-04-24 NOTE — Transfer of Care (Signed)
Immediate Anesthesia Transfer of Care Note  Patient: [name]  Procedure(s) Performed: REMOVAL OF BILATERAL TISSUE EXPANDERS WITH PLACEMENT OF BILATERAL SILICONE BREAST IMPLANTS (Bilateral Breast) LIPOFILLING TO BILATERAL CHEST, LIPOSUCTION FROM  LATERAL CHEST WALL AND BILATERAL FLANKS (Bilateral Chest)  Patient Location: PACU  Anesthesia Type:General  Level of Consciousness: awake, alert , oriented and patient cooperative  Airway & Oxygen Therapy: Patient Spontanous Breathing and Patient connected to face mask oxygen  Post-op Assessment: Report given to RN and Post -op Vital signs reviewed and stable  Post vital signs: Reviewed and stable  Last Vitals:  Vitals Value Taken Time  BP    Temp    Pulse 115 04/24/19 0958  Resp    SpO2 95 % 04/24/19 0958  Vitals shown include unvalidated device data.  Last Pain:  Vitals:   04/24/19 0707  TempSrc: Oral  PainSc: 0-No pain         Complications: No apparent anesthesia complications

## 2019-04-24 NOTE — Discharge Instructions (Signed)

## 2019-04-24 NOTE — Anesthesia Procedure Notes (Signed)
Procedure Name: LMA Insertion Date/Time: 04/24/2019 7:35 AM Performed by: Genelle Bal, CRNA Pre-anesthesia Checklist: Patient identified, Emergency Drugs available, Suction available and Patient being monitored Patient Re-evaluated:Patient Re-evaluated prior to induction Oxygen Delivery Method: Circle system utilized Preoxygenation: Pre-oxygenation with 100% oxygen Induction Type: IV induction Ventilation: Mask ventilation without difficulty LMA: LMA inserted LMA Size: 4.0 Number of attempts: 1 Airway Equipment and Method: Bite block Placement Confirmation: positive ETCO2 Tube secured with: Tape Dental Injury: Teeth and Oropharynx as per pre-operative assessment

## 2019-04-24 NOTE — Op Note (Signed)
Operative Note   DATE OF OPERATION: 8.14.20  LOCATION: [facility]-outpatient  SURGICAL DIVISION: Plastic Surgery  PREOPERATIVE DIAGNOSES:  1. History DCIS 2. BRCA2 3. Acquired absence bilateral breasts  POSTOPERATIVE DIAGNOSES:  same  PROCEDURE:  1. Removal bilateral chest tissue expanders and placement silicone implants 2. Lipofilling to bilateral chest total 110 ml  SURGEON: Irene Limbo MD MBA  ASSISTANT: none  ANESTHESIA:  General.   EBL: 25 ml  COMPLICATIONS: None immediate.   INDICATIONS FOR PROCEDURE:  The patient, [name], is a 48 y.o. female born on 27-May-1971, is here for staged breast reconstruction following bilateral skin reduction mastectomies with immediate prepectoral acellular dermis expander based reconstruction.   FINDINGS: 60 ml fat infiltrated over right chest, 50 ml fat infiltrated over left chest. Complete incorporation ADM noted bilateral. Natrelle Inspira Smooth Round Extra Projection 650 ml implants placed bilateral. REF SRX-650 SN RIGHT [id number] SN [id number]  DESCRIPTION OF PROCEDURE:  The patient's operative site was marked with the patient in the preoperative area to mark sternal notch, chest midline, anterior axillary lines.Bilateral lateral chest wall and flanks marked for liposuction.The patientwas taken to the operating room. SCDs were placed and IV antibiotics were given. The patient's operative site was prepped and draped in a sterile fashion. A time out was performed and all information was confirmed to be correct.I began onleftside. Incision made through priorinframammary foldscar and carried through superficial fascia to acellular demis.ADM incised.Expander removed.Sizer placed.  I then directed attention torightbreast andimplant cavityentered in similar manner. Expander removed and well incorporated ADM noted. Sizer placed and patient brought to upright sitting position. Natrelle Smooth RoundExtra Projection 650  mlimplant selected for bilateral chest. The patient was returned to supine position.  Stab incision made over bilateralabdomen in prior scarsand lateral extent of IMF scars. Tumescent fluid infiltrated over bilateral flanks and bilateral lateral chest wall,total565m tumescent infiltrated. Power assisted liposuction performed to endpoint symmetric contour and soft tissue thickness, total lipoaspirate40109m The fat was then washed and prepared by gravity for infiltration. Harvested fat was then infiltrated in subcutaneous plane throughout mastectomy flaps over superior pole and axillae.  Each cavity irrigated with bacitracin, Ancef, gentamicinsolution.Hemostasis ensured.Each cavity then irrigated with Betadine. The implant was placed inrightchest andimplant orientation ensured. Closure completed with 3-0 vicryl to closesuperficial fascia and ADMover implant.4-0 vicryl used to close dermis followed by 4-0 monocryl subcuticular. Implant placed inleftchest cavity.Closure completedin similar fashion.Abdomen incisions approximated with simple 4-0 monocryl stitch.Tissue adhesive applied to chest incisions. Dry dressing applied, followed by breastbinderand abdominal compression garment  The patient was allowed to wake from anesthesia, extubated and taken to the recovery room in satisfactory condition.   SPECIMENS: none  DRAINS: none  BrIrene LimboMD MBSt. Rose Hospitallastic & Reconstructive Surgery [phone]

## 2019-04-27 ENCOUNTER — Encounter (HOSPITAL_BASED_OUTPATIENT_CLINIC_OR_DEPARTMENT_OTHER): Payer: Self-pay | Admitting: Plastic Surgery

## 2019-04-27 NOTE — Anesthesia Postprocedure Evaluation (Signed)
Anesthesia Post Note  Patient: [name]  Procedure(s) Performed: REMOVAL OF BILATERAL TISSUE EXPANDERS WITH PLACEMENT OF BILATERAL SILICONE BREAST IMPLANTS (Bilateral Breast) LIPOFILLING TO BILATERAL CHEST, LIPOSUCTION FROM  LATERAL CHEST WALL AND BILATERAL FLANKS (Bilateral Chest)     Patient location during evaluation: PACU Anesthesia Type: General Level of consciousness: awake and alert Pain management: pain level controlled Vital Signs Assessment: post-procedure vital signs reviewed and stable Respiratory status: spontaneous breathing, nonlabored ventilation, respiratory function stable and patient connected to nasal cannula oxygen Cardiovascular status: blood pressure returned to baseline and stable Postop Assessment: no apparent nausea or vomiting Anesthetic complications: no    Last Vitals:  Vitals:   04/24/19 1042 04/24/19 1100  BP:  (!) 153/88  Pulse: 77 81  Resp: 12 16  Temp:  36.7 C  SpO2: 99% 98%    Last Pain:  Vitals:   04/24/19 1120  TempSrc:   PainSc: 2                  Laritza Vokes

## 2019-04-29 NOTE — Progress Notes (Signed)
GYN ONCOLOGY OFFICE VISIT   [name] 48 y.o. female  CC: BRCA2 deleterious mutation status post risk reducing oophorectomy  Assessment/Plan:  Rebecca. [name]  is a 48 y.o.  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  She is now status post risk reducing bilateral oophorectomy.  Counseled on the signs and symptoms of peritoneal cancer Follow-up with GYN in 6 months follow-up with GYN oncology in 1 year.  Recommend Ca1 25 every 6 months  Vasomotor instability Discussed a trial of black cohosh Clonopin or SSRI.  [name] will start a trial of black cohosh  HPI: [name] is a 48 year old P2 who is seen in consultation at the request of Dr [name] for a deleterious mutation in [place] in the setting of a personal history of hormone receptor positive breast cancer.  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  She underwent definitive surgery after 2-week course of preoperative tamoxifen.  The surgery was on 01/23/2019 with Dr. Donne Chambers.  It included bilateral simple mastectomy and right sentinel lymph node biopsy of the axilla.  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  The tumor was 0.5 cm.  It strongly stained for ER and PR.  She did well postoperatively.  She made a plan for reconstructive surgery in August 2020.  Given her premenopausal age she was a candidate for genetic testing.  This took place on December 10, 2018.  It confirmed a pathologic variant in BRCA2.  The patient's daughter has been subsequently tested and was positive.  The patient's personal history is significant for diagnosis of obesity (BMI 37 kg/m), hypertension, and a history of esophageal stricture and achalasia.  This was definitive treated in 1990 with thoracic surgery with a Heller  myotomy.  This was revised laparoscopically with Dr. Johnathan Chambers in 3570 and an uncomplicated procedure.  She subsequently underwent a robotic hysterectomy bilateral salpingectomy that was uncomplicated with Dr. Dellis Chambers.  Pathology from this confirmed benign fibroids.  Patient had 2 prior cesarean section deliveries.  She takes no blood thinner medications.  She works as a Cabin crew for [employer]  Family history significant for father with prostate cancer, a paternal grandmother with breast cancer, a brother with lung cancer.  Status post robiotic assisted risk reducing salpingectomy on 03/26/2019 Path 1. Ovary and fallopian tube, right - BENIGN OVARIAN TISSUE. - NO DISTINCT FALLOPIAN TUBE TISSUE PRESENT GROSSLY OR HISTOLOGICALLY. 2. Ovary and fallopian tube, left - BENIGN OVARIAN TISSUE. - NO DISTINCT FALLOPIAN TUBE TISSUE PRESENT GROSSLY OR HISTOLOGICALLY. 3. Peritoneum, biopsy - BENIGN MESOTHELIAL-LINED FIBROUS AND ADIPOSE TISSUE.  Doing well postop  Current Meds:  Outpatient Encounter Medications as of 04/30/2019  Medication Sig  . APPLE CIDER VINEGAR PO Take 1 tablet by mouth daily. Gummy  . Biotin 5000 MCG TABS Take 5,000 mcg by mouth daily.  Marland Kitchen EPINEPHrine 0.3 mg/0.3 mL IJ SOAJ injection Inject into the muscle. As needed for shrimp allergy  . ibuprofen (ADVIL) 600 MG tablet Take 1 tablet (600 mg total) by mouth every 6 (six) hours as needed for moderate pain. For AFTER surgery   No facility-administered encounter medications on file as of 04/30/2019.     Allergy:  Allergies  Allergen Reactions  . Lisinopril-Hydrochlorothiazide Swelling    Lips swelling.  Marland Kitchen  Shellfish Allergy Hives    Social Hx:   Social History   Socioeconomic History  . Marital status: Married    Spouse name: [name]  . Number of children: 2  . Years of education: 3  . Highest education level: Not on file  Occupational History    Employer: [employer]  Social Needs  . Financial resource  strain: Not on file  . Food insecurity    Worry: Never true    Inability: Never true  . Transportation needs    Medical: No    Non-medical: No  Tobacco Use  . Smoking status: Former Smoker    Packs/day: 0.25    Years: 0.50    Pack years: 0.12    Types: Cigarettes    Quit date: 10/07/1990    Years since quitting: 28.5  . Smokeless tobacco: Never Used  . Tobacco comment: Smoked for 2 months, nothing heavier  Substance and Sexual Activity  . Alcohol use: Yes    Alcohol/week: 1.0 standard drinks    Types: 1 Glasses of wine per week    Comment: Rarely  . Drug use: No  . Sexual activity: Yes    Birth control/protection: Surgical    Comment: hysterectomy w/ BSO on 12/02/2013  Lifestyle  . Physical activity    Days per week: Not on file    Minutes per session: Not on file  . Stress: Not on file  Relationships  . Social Herbalist on phone: Not on file    Gets together: Not on file    Attends religious service: Not on file    Active member of club or organization: Not on file    Attends meetings of clubs or organizations: Not on file    Relationship status: Not on file  . Intimate partner violence    Fear of current or ex partner: Not on file    Emotionally abused: Not on file    Physically abused: Not on file    Forced sexual activity: Not on file  Other Topics Concern  . Not on file  Social History Narrative   Lives at home with husband, working and goes to school, has 2 kids. Very minimal smoking history, very rare alcohol.    Caffeine -rarely    Past Surgical Hx:  Past Surgical History:  Procedure Laterality Date  . ACHALASIA SURGERY  1991   transthoracic Heller myotomy in [place]  . BILATERAL SALPINGECTOMY Bilateral 12/02/2013   Procedure: BILATERAL SALPINGECTOMY;  Surgeon: Princess Bruins, MD;  Location: [facility];  Service: Gynecology;  Laterality: Bilateral;  . BREAST RECONSTRUCTION WITH PLACEMENT OF TISSUE EXPANDER AND FLEX HD (ACELLULAR HYDRATED DERMIS)  Bilateral 01/23/2019   Procedure: BREAST RECONSTRUCTION WITH PLACEMENT OF TISSUE EXPANDER AND FLEX HD (ACELLULAR HYDRATED DERMIS);  Surgeon: Irene Limbo, MD;  Location: [facility];  Service: Plastics;  Laterality: Bilateral;  . BREAST SURGERY Bilateral 01/23/2019  . CESAREAN SECTION     x2  . ESOPHAGOGASTRODUODENOSCOPY  10/08/2011   Procedure: ESOPHAGOGASTRODUODENOSCOPY (EGD);  Surgeon: Jeryl Columbia, MD;  Location[facility];  Service: Endoscopy;  Laterality: N/A;  . ESOPHAGOGASTRODUODENOSCOPY  10/16/2011   Procedure: ESOPHAGOGASTRODUODENOSCOPY (EGD);  Surgeon: Cleotis Nipper, MD;  Location: [facility];  Service: Endoscopy;  Laterality: N/A;  . ESOPHAGOGASTRODUODENOSCOPY  11/06/2011   Procedure: ESOPHAGOGASTRODUODENOSCOPY (EGD);  Surgeon: Lear Ng, MD;  Location: [facility];  Service: Endoscopy;  Laterality: N/A;  . FOREIGN BODY REMOVAL  10/08/2011   Procedure: FOREIGN  BODY REMOVAL;  Surgeon: Jeryl Columbia, MD;  Location: [facility]COPY;  Service: Endoscopy;  Laterality: N/A;  botox  needed /ja/magod  . FOREIGN BODY REMOVAL  10/16/2011   Procedure: FOREIGN BODY REMOVAL;  Surgeon: Cleotis Nipper, MD;  Location: [facility];  Service: Endoscopy;  Laterality: N/A;  . HELLER MYOTOMY  11/07/2011   Procedure: LAPAROSCOPIC HELLER MYOTOMY;  Surgeon: Pedro Earls, MD;  Location: [facility];  Service: General;  Laterality: N/A;  Laparoscopic Redo Heller Myotomy   . LIPOSUCTION WITH LIPOFILLING Bilateral 04/24/2019   Procedure: LIPOFILLING TO BILATERAL CHEST, LIPOSUCTION FROM  LATERAL CHEST WALL AND BILATERAL FLANKS;  Surgeon: Irene Limbo, MD;  Location: [facility];  Service: Plastics;  Laterality: Bilateral;  . MASTECTOMY W/ SENTINEL NODE BIOPSY Bilateral 01/23/2019   Procedure: RIGHT MASTECTOMY WITH RIGHT AXILLARY SENTINEL LYMPH NODE BIOPSY AND LEFT RISK REDUCING MASTECTOMY;  Surgeon: Rolm Bookbinder, MD;  Location: [facility];  Service:  General;  Laterality: Bilateral;  . REMOVAL OF BILATERAL TISSUE EXPANDERS WITH PLACEMENT OF BILATERAL BREAST IMPLANTS Bilateral 04/24/2019   Procedure: REMOVAL OF BILATERAL TISSUE EXPANDERS WITH PLACEMENT OF BILATERAL SILICONE BREAST IMPLANTS;  Surgeon: Irene Limbo, MD;  Location: [facility];  Service: Plastics;  Laterality: Bilateral;  . ROBOTIC ASSISTED SALPINGO OOPHERECTOMY Bilateral 03/26/2019   Procedure: ROBOTIC ASSISTED SALPINGO OOPHORECTOMY;  Surgeon: Janie Morning, MD;  Location: [facility];  Service: Gynecology;  Laterality: Bilateral;  . ROBOTIC ASSISTED TOTAL HYSTERECTOMY N/A 12/02/2013   Procedure: ROBOTIC ASSISTED TOTAL HYSTERECTOMY;  Surgeon: Princess Bruins, MD;  Location: [facility];  Service: Gynecology;  Laterality: N/A;  . TONSILLECTOMY  1995    Past Medical Hx:  Past Medical History:  Diagnosis Date  . Achalasia    S/p Heller myotomy 1991 and numerous dilations  . Breast cancer ([place]) 12/01/2018   right, DCIS  . Family history of breast cancer   . GERD (gastroesophageal reflux disease)    Hx - None since 2013 Heller myotomy surgery  . Hypertension   . Pleural effusion 2013   with Heller myotomy surgery    Past Gynecological History:  See HPI Patient's last menstrual period was 11/17/2013.  Family Hx:  Family History  Problem Relation Age of Onset  . Hypertension Mother   . Prostate cancer Father 64  . Breast cancer Paternal Grandmother        late 71s?  . Lung cancer Brother 32  . Other Maternal Grandmother        Childbirth  . Anesthesia problems Neg Hx   . Hypotension Neg Hx   . Malignant hyperthermia Neg Hx   . Pseudochol deficiency Neg Hx     Review of Systems: Review of Systems  Constitutional: Negative.   HENT: Negative.   Eyes: Negative.   Respiratory: Negative.   Cardiovascular: Negative.   Gastrointestinal: Negative.   Genitourinary: Negative.   Musculoskeletal: Negative.   Skin:       Hyperemia at mental wall sites of  liposuction  Neurological:       Reports hot flashes approximately 3/day.  Denies awakening at night because of hot flashes   .   Vitals:  Blood pressure (!) 141/84, pulse 83, temperature 99.1 F (37.3 C), resp. rate 18, height 5' 6"  (1.676 m), weight 227 lb 6.4 oz (103.1 kg), last menstrual period 11/17/2013, SpO2 100 %.  Physical Exam: WD in NAD Neck  Supple NROM, without any enlargements.  Skin  Hyperemia at the sites of liposuction Psychiatry  Alert and oriented to person, place, and time  Abdomen  Normoactive bowel sounds, abdomen soft, non-tender and obese  Back No CVA tenderness Extremities  No bilateral cyanosis, clubbing or edema.

## 2019-04-30 ENCOUNTER — Inpatient Hospital Stay: Payer: Managed Care, Other (non HMO) | Attending: Oncology | Admitting: Gynecologic Oncology

## 2019-04-30 ENCOUNTER — Other Ambulatory Visit: Payer: Self-pay

## 2019-04-30 VITALS — BP 141/84 | HR 83 | Temp 99.1°F | Resp 18 | Ht 66.0 in | Wt 227.4 lb

## 2019-04-30 DIAGNOSIS — D0511 Intraductal carcinoma in situ of right breast: Secondary | ICD-10-CM | POA: Diagnosis present

## 2019-04-30 DIAGNOSIS — Z6837 Body mass index (BMI) 37.0-37.9, adult: Secondary | ICD-10-CM | POA: Diagnosis not present

## 2019-04-30 DIAGNOSIS — K222 Esophageal obstruction: Secondary | ICD-10-CM | POA: Insufficient documentation

## 2019-04-30 DIAGNOSIS — Z1501 Genetic susceptibility to malignant neoplasm of breast: Secondary | ICD-10-CM

## 2019-04-30 DIAGNOSIS — E669 Obesity, unspecified: Secondary | ICD-10-CM | POA: Insufficient documentation

## 2019-04-30 DIAGNOSIS — Z90722 Acquired absence of ovaries, bilateral: Secondary | ICD-10-CM | POA: Insufficient documentation

## 2019-04-30 DIAGNOSIS — R55 Syncope and collapse: Secondary | ICD-10-CM | POA: Diagnosis not present

## 2019-04-30 DIAGNOSIS — Z90710 Acquired absence of both cervix and uterus: Secondary | ICD-10-CM | POA: Diagnosis not present

## 2019-04-30 DIAGNOSIS — Z148 Genetic carrier of other disease: Secondary | ICD-10-CM | POA: Diagnosis present

## 2019-04-30 DIAGNOSIS — Z1502 Genetic susceptibility to malignant neoplasm of ovary: Secondary | ICD-10-CM | POA: Insufficient documentation

## 2019-04-30 DIAGNOSIS — Z1509 Genetic susceptibility to other malignant neoplasm: Secondary | ICD-10-CM

## 2019-04-30 DIAGNOSIS — K220 Achalasia of cardia: Secondary | ICD-10-CM | POA: Diagnosis not present

## 2019-04-30 DIAGNOSIS — C50911 Malignant neoplasm of unspecified site of right female breast: Secondary | ICD-10-CM | POA: Diagnosis not present

## 2019-04-30 DIAGNOSIS — Z87891 Personal history of nicotine dependence: Secondary | ICD-10-CM | POA: Diagnosis not present

## 2019-04-30 DIAGNOSIS — I10 Essential (primary) hypertension: Secondary | ICD-10-CM | POA: Diagnosis not present

## 2019-04-30 NOTE — Patient Instructions (Signed)
F/U in 1 year F/U with Gyn in 6 months  Thank you very much [name] for allowing me to provide care for you today.  I appreciate your confidence in choosing our Gynecologic Oncology team.  If you have any questions about your visit today please call our office and we will get back to you as soon as possible.  Please consider using the website Medlineplus.gov as an Geneticist, molecular.   Francetta Found. Mackenzy Eisenberg MD., PhD Gynecologic Oncology

## 2019-05-14 ENCOUNTER — Ambulatory Visit: Payer: Managed Care, Other (non HMO) | Admitting: Gynecologic Oncology

## 2019-06-05 ENCOUNTER — Encounter: Payer: Self-pay | Admitting: *Deleted

## 2019-07-05 NOTE — Progress Notes (Signed)
No show

## 2019-07-06 ENCOUNTER — Inpatient Hospital Stay: Payer: Managed Care, Other (non HMO) | Attending: Oncology | Admitting: Oncology

## 2019-07-06 ENCOUNTER — Encounter: Payer: Self-pay | Admitting: Oncology

## 2019-07-06 ENCOUNTER — Inpatient Hospital Stay: Payer: Managed Care, Other (non HMO)

## 2019-07-06 DIAGNOSIS — C50911 Malignant neoplasm of unspecified site of right female breast: Secondary | ICD-10-CM

## 2019-07-06 DIAGNOSIS — Z1501 Genetic susceptibility to malignant neoplasm of breast: Secondary | ICD-10-CM

## 2019-07-06 DIAGNOSIS — D0511 Intraductal carcinoma in situ of right breast: Secondary | ICD-10-CM

## 2019-08-12 ENCOUNTER — Telehealth: Payer: Self-pay | Admitting: Adult Health

## 2019-08-12 NOTE — Telephone Encounter (Signed)
I talk with patient regarding video visit °

## 2019-08-18 ENCOUNTER — Encounter: Payer: Self-pay | Admitting: *Deleted

## 2019-08-21 ENCOUNTER — Other Ambulatory Visit: Payer: Self-pay

## 2019-08-21 DIAGNOSIS — Z20822 Contact with and (suspected) exposure to covid-19: Secondary | ICD-10-CM

## 2019-08-23 LAB — NOVEL CORONAVIRUS, NAA: SARS-CoV-2, NAA: NOT DETECTED

## 2019-08-28 ENCOUNTER — Telehealth: Payer: Self-pay | Admitting: Adult Health

## 2019-08-28 NOTE — Telephone Encounter (Signed)
I talk with patient regarding reschedule °

## 2019-09-10 ENCOUNTER — Encounter: Payer: Self-pay | Admitting: *Deleted

## 2019-09-14 ENCOUNTER — Telehealth: Payer: Managed Care, Other (non HMO) | Admitting: Adult Health

## 2019-09-30 ENCOUNTER — Encounter: Payer: Self-pay | Admitting: *Deleted

## 2019-09-30 ENCOUNTER — Telehealth (HOSPITAL_BASED_OUTPATIENT_CLINIC_OR_DEPARTMENT_OTHER): Payer: Managed Care, Other (non HMO) | Admitting: Adult Health

## 2019-09-30 DIAGNOSIS — D0511 Intraductal carcinoma in situ of right breast: Secondary | ICD-10-CM

## 2019-09-30 NOTE — Progress Notes (Signed)
Patient did not show.  Will have RN reach out to patient so that we can reschedule.    Wilber Bihari, NP

## 2020-11-22 ENCOUNTER — Encounter: Payer: Self-pay | Admitting: Genetic Counselor

## 2021-03-16 ENCOUNTER — Encounter (HOSPITAL_BASED_OUTPATIENT_CLINIC_OR_DEPARTMENT_OTHER): Payer: Self-pay | Admitting: Plastic Surgery

## 2021-03-16 ENCOUNTER — Other Ambulatory Visit: Payer: Self-pay

## 2021-03-16 NOTE — H&P (Signed)
Subjective:     Patient ID: [name] is a 50 y.o. female.   Follow-up     2 years post bilateral mastectomies with implant based reconstruction. Patient desires revision reconstruction for larger implants.   Presented with palpable right breast mass. Screening MMG showed right breast calcifications and possible left breast mass. Diagnostic MMG showed 6 mm group of calcifications within the right UOQ . 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   MRI demonstrated 9 mm mass within the upper-outer right breast anterior depth which may represent intramammary LN. Adjacent 5 mm mass which may represent intramammary LN . Possibility of malignancy at this location is not excluded.    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 One daughter with BRCA2.   Prior 56 DD. Desires no larger. 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 Two kids, daughter is Presenter, broadcasting at [employer].   Review of Systems      Objective:   Physical Exam  Cardiovascular: Normal rate, regular rhythm and normal heart sounds.  Pulmonary/Chest: Effort normal and breath sounds normal.      Chest:  soft chest bilateral symmetric volume no rippling in supine position small lateral movement left implant No masses axillae benign  Right implant stamp palpable      Assessment:     DCIS right breast BRCA2 S/p bilateral SRM, Right SLN, bilateral prepectoral TE/ADM (Alloderm) reconstruction S/p silicone implant exchange, lipofilling chest Right AP malposition implant    Plan:     Reviewed her current implant volume and largest volume silicone 035 ml. Feel the width of 800 ml implant too wide for her. Reviewed with increased volume implant also more width implant. Counseled implant exchange would not  change implant position on chest. Reviewed AP malposition possible with any implant. Reviewed imaging surveillance silicone implants and FDA guidelines. Reviewed OP surgery, no drains anticipated, incisions, post op limitations.   Reviewed implants are not lifetime devices and additional surgery may be needed. Reviewed risks including but not limited to bleeding seroma hematoma damage to adjacent structutres blood clots in legs or lungs reviewed.    Completed [name] physician patient checklist.   Rx for oxycodone and Bactrim given.     60 ml fat infiltrated over right chest, 50 ml fat infiltrated over left chest.  Natrelle Inspira Smooth Round Extra Projection 650 ml implants placed bilateral. REF [id number]

## 2021-03-20 NOTE — Progress Notes (Signed)

## 2021-03-24 ENCOUNTER — Ambulatory Visit (HOSPITAL_BASED_OUTPATIENT_CLINIC_OR_DEPARTMENT_OTHER): Payer: 59 | Admitting: Certified Registered"

## 2021-03-24 ENCOUNTER — Encounter (HOSPITAL_BASED_OUTPATIENT_CLINIC_OR_DEPARTMENT_OTHER): Payer: Self-pay | Admitting: Plastic Surgery

## 2021-03-24 ENCOUNTER — Ambulatory Visit (HOSPITAL_BASED_OUTPATIENT_CLINIC_OR_DEPARTMENT_OTHER)
Admission: RE | Admit: 2021-03-24 | Discharge: 2021-03-24 | Disposition: A | Discharge status: Home / Self Care | Payer: 59 | Attending: Plastic Surgery | Admitting: Plastic Surgery

## 2021-03-24 ENCOUNTER — Other Ambulatory Visit: Payer: Self-pay

## 2021-03-24 ENCOUNTER — Encounter (HOSPITAL_BASED_OUTPATIENT_CLINIC_OR_DEPARTMENT_OTHER)
Admission: RE | Disposition: A | Discharge status: Home / Self Care | Payer: Self-pay | Source: Home / Self Care | Attending: Plastic Surgery

## 2021-03-24 DIAGNOSIS — T85898A Other specified complication of other internal prosthetic devices, implants and grafts, initial encounter: Secondary | ICD-10-CM | POA: Insufficient documentation

## 2021-03-24 DIAGNOSIS — K219 Gastro-esophageal reflux disease without esophagitis: Secondary | ICD-10-CM | POA: Insufficient documentation

## 2021-03-24 DIAGNOSIS — Z87891 Personal history of nicotine dependence: Secondary | ICD-10-CM | POA: Diagnosis not present

## 2021-03-24 DIAGNOSIS — Z9013 Acquired absence of bilateral breasts and nipples: Secondary | ICD-10-CM | POA: Insufficient documentation

## 2021-03-24 DIAGNOSIS — X58XXXA Exposure to other specified factors, initial encounter: Secondary | ICD-10-CM | POA: Insufficient documentation

## 2021-03-24 DIAGNOSIS — I10 Essential (primary) hypertension: Secondary | ICD-10-CM | POA: Insufficient documentation

## 2021-03-24 DIAGNOSIS — Z853 Personal history of malignant neoplasm of breast: Secondary | ICD-10-CM | POA: Diagnosis not present

## 2021-03-24 HISTORY — PX: BREAST IMPLANT EXCHANGE: SHX6296

## 2021-03-24 SURGERY — REPLACEMENT, IMPLANT, BREAST
Anesthesia: General | Site: Breast | Laterality: Bilateral

## 2021-03-24 MED ORDER — FENTANYL CITRATE (PF) 100 MCG/2ML IJ SOLN
INTRAMUSCULAR | Status: AC
  Filled 2021-03-24: qty 2

## 2021-03-24 MED ORDER — SODIUM CHLORIDE 0.9 % IV SOLN
INTRAVENOUS | Status: AC
  Filled 2021-03-24: qty 10

## 2021-03-24 MED ORDER — ACETAMINOPHEN 10 MG/ML IV SOLN: 1000.0000 mg | Freq: Once | INTRAVENOUS | Status: DC | PRN

## 2021-03-24 MED ORDER — LIDOCAINE HCL (PF) 2 % IJ SOLN
INTRAMUSCULAR | Status: AC
  Filled 2021-03-24: qty 5

## 2021-03-24 MED ORDER — ACETAMINOPHEN 160 MG/5ML PO SOLN
325.0000 mg | ORAL | Status: DC | PRN
Start: 2021-03-24 — End: 2021-03-24

## 2021-03-24 MED ORDER — DEXAMETHASONE SODIUM PHOSPHATE 10 MG/ML IJ SOLN
INTRAMUSCULAR | Status: AC
  Filled 2021-03-24: qty 1

## 2021-03-24 MED ORDER — ROCURONIUM BROMIDE 100 MG/10ML IV SOLN
INTRAVENOUS | Status: DC | PRN
  Administered 2021-03-24: 50 mg via INTRAVENOUS

## 2021-03-24 MED ORDER — ROCURONIUM BROMIDE 10 MG/ML (PF) SYRINGE
PREFILLED_SYRINGE | INTRAVENOUS | Status: AC
  Filled 2021-03-24: qty 20

## 2021-03-24 MED ORDER — SUGAMMADEX SODIUM 200 MG/2ML IV SOLN
INTRAVENOUS | Status: DC | PRN
  Administered 2021-03-24: 200 mg via INTRAVENOUS

## 2021-03-24 MED ORDER — PROPOFOL 10 MG/ML IV BOLUS
INTRAVENOUS | Status: AC
  Filled 2021-03-24: qty 20

## 2021-03-24 MED ORDER — SODIUM CHLORIDE 0.9 % IV SOLN
INTRAVENOUS | Status: DC | PRN
  Administered 2021-03-24: 500 mL

## 2021-03-24 MED ORDER — ONDANSETRON HCL 4 MG/2ML IJ SOLN
INTRAMUSCULAR | Status: AC
  Filled 2021-03-24: qty 2

## 2021-03-24 MED ORDER — CHLORHEXIDINE GLUCONATE CLOTH 2 % EX PADS: 6.0000 | MEDICATED_PAD | Freq: Once | CUTANEOUS | Status: DC

## 2021-03-24 MED ORDER — FENTANYL CITRATE (PF) 100 MCG/2ML IJ SOLN
INTRAMUSCULAR | Status: DC | PRN
  Administered 2021-03-24 (×2): 50 ug via INTRAVENOUS

## 2021-03-24 MED ORDER — MIDAZOLAM HCL 2 MG/2ML IJ SOLN
INTRAMUSCULAR | Status: AC
  Filled 2021-03-24: qty 2

## 2021-03-24 MED ORDER — LIDOCAINE HCL (CARDIAC) PF 100 MG/5ML IV SOSY
PREFILLED_SYRINGE | INTRAVENOUS | Status: DC | PRN
  Administered 2021-03-24: 40 mg via INTRAVENOUS

## 2021-03-24 MED ORDER — SCOPOLAMINE 1 MG/3DAYS TD PT72
1.0000 | MEDICATED_PATCH | TRANSDERMAL | Status: DC
  Administered 2021-03-24: 1 via TRANSDERMAL

## 2021-03-24 MED ORDER — CELECOXIB 200 MG PO CAPS
200.0000 mg | ORAL_CAPSULE | ORAL | Status: AC
  Administered 2021-03-24: 200 mg via ORAL

## 2021-03-24 MED ORDER — ONDANSETRON HCL 4 MG/2ML IJ SOLN
INTRAMUSCULAR | Status: DC | PRN
  Administered 2021-03-24: 4 mg via INTRAVENOUS

## 2021-03-24 MED ORDER — CEFAZOLIN SODIUM-DEXTROSE 2-4 GM/100ML-% IV SOLN
2.0000 g | INTRAVENOUS | Status: AC
  Administered 2021-03-24: 2 g via INTRAVENOUS

## 2021-03-24 MED ORDER — CELECOXIB 200 MG PO CAPS
ORAL_CAPSULE | ORAL | Status: AC
  Filled 2021-03-24: qty 1

## 2021-03-24 MED ORDER — ACETAMINOPHEN 500 MG PO TABS
ORAL_TABLET | ORAL | Status: AC
  Filled 2021-03-24: qty 2

## 2021-03-24 MED ORDER — EPHEDRINE 5 MG/ML INJ
INTRAVENOUS | Status: AC
  Filled 2021-03-24: qty 10

## 2021-03-24 MED ORDER — DEXAMETHASONE SODIUM PHOSPHATE 4 MG/ML IJ SOLN
INTRAMUSCULAR | Status: DC | PRN
  Administered 2021-03-24: 10 mg via INTRAVENOUS

## 2021-03-24 MED ORDER — OXYCODONE HCL 5 MG PO TABS
ORAL_TABLET | ORAL | Status: AC
  Filled 2021-03-24: qty 1

## 2021-03-24 MED ORDER — LACTATED RINGERS IV SOLN: INTRAVENOUS | Status: DC

## 2021-03-24 MED ORDER — POVIDONE-IODINE 10 % EX SOLN
CUTANEOUS | Status: DC | PRN
  Administered 2021-03-24: 1 via TOPICAL

## 2021-03-24 MED ORDER — MIDAZOLAM HCL 5 MG/5ML IJ SOLN
INTRAMUSCULAR | Status: DC | PRN
  Administered 2021-03-24: 2 mg via INTRAVENOUS

## 2021-03-24 MED ORDER — ACETAMINOPHEN 500 MG PO TABS
1000.0000 mg | ORAL_TABLET | ORAL | Status: AC
  Administered 2021-03-24: 1000 mg via ORAL

## 2021-03-24 MED ORDER — GABAPENTIN 300 MG PO CAPS
300.0000 mg | ORAL_CAPSULE | ORAL | Status: AC
  Administered 2021-03-24: 300 mg via ORAL

## 2021-03-24 MED ORDER — ACETAMINOPHEN 325 MG PO TABS: 325.0000 mg | ORAL_TABLET | ORAL | Status: DC | PRN

## 2021-03-24 MED ORDER — OXYCODONE HCL 5 MG PO TABS: 5.0000 mg | ORAL_TABLET | Freq: Once | ORAL | Status: DC | PRN

## 2021-03-24 MED ORDER — GABAPENTIN 300 MG PO CAPS
ORAL_CAPSULE | ORAL | Status: AC
  Filled 2021-03-24: qty 1

## 2021-03-24 MED ORDER — PROMETHAZINE HCL 25 MG/ML IJ SOLN: 6.2500 mg | INTRAMUSCULAR | Status: DC | PRN

## 2021-03-24 MED ORDER — PROPOFOL 10 MG/ML IV BOLUS
INTRAVENOUS | Status: DC | PRN
  Administered 2021-03-24: 150 mg via INTRAVENOUS

## 2021-03-24 MED ORDER — FENTANYL CITRATE (PF) 100 MCG/2ML IJ SOLN
25.0000 ug | INTRAMUSCULAR | Status: DC | PRN
  Administered 2021-03-24: 50 ug via INTRAVENOUS

## 2021-03-24 MED ORDER — AMISULPRIDE (ANTIEMETIC) 5 MG/2ML IV SOLN: 10.0000 mg | Freq: Once | INTRAVENOUS | Status: DC | PRN

## 2021-03-24 MED ORDER — CEFAZOLIN SODIUM-DEXTROSE 2-4 GM/100ML-% IV SOLN
INTRAVENOUS | Status: AC
  Filled 2021-03-24: qty 100

## 2021-03-24 MED ORDER — OXYCODONE HCL 5 MG/5ML PO SOLN: 5.0000 mg | Freq: Once | ORAL | Status: DC | PRN

## 2021-03-24 SURGICAL SUPPLY — 73 items
ADH SKN CLS APL DERMABOND .7 (GAUZE/BANDAGES/DRESSINGS) ×2
APL PRP STRL LF DISP 70% ISPRP (MISCELLANEOUS) ×2
BAG DECANTER FOR FLEXI CONT (MISCELLANEOUS) ×2 IMPLANT
BINDER BREAST LRG (GAUZE/BANDAGES/DRESSINGS) IMPLANT
BINDER BREAST MEDIUM (GAUZE/BANDAGES/DRESSINGS) IMPLANT
BINDER BREAST XLRG (GAUZE/BANDAGES/DRESSINGS) IMPLANT
BINDER BREAST XXLRG (GAUZE/BANDAGES/DRESSINGS) ×1 IMPLANT
BLADE SURG 10 STRL SS (BLADE) ×3 IMPLANT
BNDG GAUZE ELAST 4 BULKY (GAUZE/BANDAGES/DRESSINGS) ×3 IMPLANT
CANISTER SUCT 1200ML W/VALVE (MISCELLANEOUS) ×2 IMPLANT
CHLORAPREP W/TINT 26 (MISCELLANEOUS) ×3 IMPLANT
COVER BACK TABLE 60X90IN (DRAPES) ×2 IMPLANT
COVER MAYO STAND STRL (DRAPES) ×2 IMPLANT
DECANTER SPIKE VIAL GLASS SM (MISCELLANEOUS) IMPLANT
DERMABOND ADVANCED (GAUZE/BANDAGES/DRESSINGS) ×2
DERMABOND ADVANCED .7 DNX12 (GAUZE/BANDAGES/DRESSINGS) ×1 IMPLANT
DRAIN CHANNEL 15F RND FF W/TCR (WOUND CARE) IMPLANT
DRAPE INCISE IOBAN 66X45 STRL (DRAPES) IMPLANT
DRAPE TOP ARMCOVERS (MISCELLANEOUS) ×2 IMPLANT
DRAPE U-SHAPE 76X120 STRL (DRAPES) ×2 IMPLANT
DRAPE UTILITY XL STRL (DRAPES) ×3 IMPLANT
DRSG PAD ABDOMINAL 8X10 ST (GAUZE/BANDAGES/DRESSINGS) ×4 IMPLANT
ELECT BLADE 4.0 EZ CLEAN MEGAD (MISCELLANEOUS)
ELECT COATED BLADE 2.86 ST (ELECTRODE) ×2 IMPLANT
ELECT REM PT RETURN 9FT ADLT (ELECTROSURGICAL) ×2
ELECTRODE BLDE 4.0 EZ CLN MEGD (MISCELLANEOUS) IMPLANT
ELECTRODE REM PT RTRN 9FT ADLT (ELECTROSURGICAL) ×1 IMPLANT
EVACUATOR SILICONE 100CC (DRAIN) IMPLANT
FUNNEL KELLER 2 DISP (MISCELLANEOUS) ×1 IMPLANT
GLOVE SURG ENC MOIS LTX SZ6.5 (GLOVE) ×1 IMPLANT
GLOVE SURG HYDRASOFT LTX SZ5.5 (GLOVE) ×4 IMPLANT
GLOVE SURG UNDER POLY LF SZ6.5 (GLOVE) IMPLANT
GOWN STRL REUS W/ TWL LRG LVL3 (GOWN DISPOSABLE) ×2 IMPLANT
GOWN STRL REUS W/TWL LRG LVL3 (GOWN DISPOSABLE) ×4
IMPL BREAST P6.7XRND MDRT 750 (Breast) IMPLANT
IMPL BRST P6.7XRND MDRT 750CC (Breast) ×2 IMPLANT
IMPLANT BREAST GEL 750CC (Breast) ×4 IMPLANT
IV NS 500ML (IV SOLUTION)
IV NS 500ML BAXH (IV SOLUTION) IMPLANT
KIT FILL SYSTEM UNIVERSAL (SET/KITS/TRAYS/PACK) IMPLANT
MARKER SKIN DUAL TIP RULER LAB (MISCELLANEOUS) IMPLANT
NDL FILTER BLUNT 18X1 1/2 (NEEDLE) IMPLANT
NDL HYPO 25X1 1.5 SAFETY (NEEDLE) IMPLANT
NEEDLE FILTER BLUNT 18X 1/2SAF (NEEDLE) ×1
NEEDLE FILTER BLUNT 18X1 1/2 (NEEDLE) ×1 IMPLANT
NEEDLE HYPO 25X1 1.5 SAFETY (NEEDLE) IMPLANT
PACK BASIN DAY SURGERY FS (CUSTOM PROCEDURE TRAY) ×2 IMPLANT
PENCIL SMOKE EVACUATOR (MISCELLANEOUS) ×2 IMPLANT
PIN SAFETY STERILE (MISCELLANEOUS) ×1 IMPLANT
SHEET MEDIUM DRAPE 40X70 STRL (DRAPES) ×2 IMPLANT
SIZER BREAST REUSE 750CC (SIZER) ×2
SIZER BRST REUSE P6.7X 750CC (SIZER) IMPLANT
SLEEVE SCD COMPRESS KNEE MED (STOCKING) ×2 IMPLANT
SPONGE T-LAP 18X18 ~~LOC~~+RFID (SPONGE) ×4 IMPLANT
STAPLER VISISTAT 35W (STAPLE) ×2 IMPLANT
SUT ETHILON 2 0 FS 18 (SUTURE) IMPLANT
SUT MNCRL AB 4-0 PS2 18 (SUTURE) ×1 IMPLANT
SUT PDS 3-0 CT2 (SUTURE)
SUT PDS AB 2-0 CT2 27 (SUTURE) IMPLANT
SUT PDS II 3-0 CT2 27 ABS (SUTURE) IMPLANT
SUT VIC AB 3-0 PS1 18 (SUTURE)
SUT VIC AB 3-0 PS1 18XBRD (SUTURE) IMPLANT
SUT VIC AB 3-0 SH 27 (SUTURE) ×4
SUT VIC AB 3-0 SH 27X BRD (SUTURE) ×1 IMPLANT
SUT VICRYL 4-0 PS2 18IN ABS (SUTURE) ×1 IMPLANT
SYR 20ML LL LF (SYRINGE) ×1 IMPLANT
SYR 50ML LL SCALE MARK (SYRINGE) IMPLANT
SYR BULB IRRIG 60ML STRL (SYRINGE) ×2 IMPLANT
SYR CONTROL 10ML LL (SYRINGE) IMPLANT
TOWEL GREEN STERILE FF (TOWEL DISPOSABLE) ×3 IMPLANT
TUBE CONNECTING 20X1/4 (TUBING) ×2 IMPLANT
UNDERPAD 30X36 HEAVY ABSORB (UNDERPADS AND DIAPERS) ×4 IMPLANT
YANKAUER SUCT BULB TIP NO VENT (SUCTIONS) ×2 IMPLANT

## 2021-03-24 NOTE — Interval H&P Note (Signed)
History and Physical Interval Note:  03/24/2021 8:42 AM  [name]  has presented today for surgery, with the diagnosis of BRCA2, acquired absence breasts, history DCIS breast.  The various methods of treatment have been discussed with the patient and family. After consideration of risks, benefits and other options for treatment, the patient has consented to  Procedure(s): REVISION BILATERAL BREAST RECONSTRUCTION WITH SILICONE BREAST IMPLANT EXCHANGE (Bilateral) as a surgical intervention.  The patient's history has been reviewed, patient examined, no change in status, stable for surgery.  I have reviewed the patient's chart and labs.  Questions were answered to the patient's satisfaction.     Arnoldo Hooker Raad Clayson

## 2021-03-24 NOTE — Anesthesia Procedure Notes (Signed)
Procedure Name: Intubation Date/Time: 03/24/2021 9:28 AM Performed by: Ezequiel Kayser, CRNA Pre-anesthesia Checklist: Patient identified, Emergency Drugs available, Suction available and Patient being monitored Patient Re-evaluated:Patient Re-evaluated prior to induction Oxygen Delivery Method: Circle System Utilized Preoxygenation: Pre-oxygenation with 100% oxygen Induction Type: IV induction Ventilation: Mask ventilation without difficulty Laryngoscope Size: Mac and 3 Grade View: Grade III Tube type: Oral Tube size: 7.0 mm Number of attempts: 1 Airway Equipment and Method: Stylet and Oral airway Placement Confirmation: ETT inserted through vocal cords under direct vision, positive ETCO2 and breath sounds checked- equal and bilateral Secured at: 22 cm Tube secured with: Tape Dental Injury: Teeth and Oropharynx as per pre-operative assessment

## 2021-03-24 NOTE — Discharge Instructions (Signed)
May take NSAIDS (Ibuprofen, Motrin) after 3pm, if needed May take Tylenol after 3pm, if needed.    Post Anesthesia Home Care Instructions  Activity: Get plenty of rest for the remainder of the day. A responsible individual must stay with you for 24 hours following the procedure.  For the next 24 hours, DO NOT: -Drive a car -Paediatric nurse -Drink alcoholic beverages -Take any medication unless instructed by your physician -Make any legal decisions or sign important papers.  Meals: Start with liquid foods such as gelatin or soup. Progress to regular foods as tolerated. Avoid greasy, spicy, heavy foods. If nausea and/or vomiting occur, drink only clear liquids until the nausea and/or vomiting subsides. Call your physician if vomiting continues.  Special Instructions/Symptoms: Your throat may feel dry or sore from the anesthesia or the breathing tube placed in your throat during surgery. If this causes discomfort, gargle with warm salt water. The discomfort should disappear within 24 hours.  If you had a scopolamine patch placed behind your ear for the management of post- operative nausea and/or vomiting:  1. The medication in the patch is effective for 72 hours, after which it should be removed.  Wrap patch in a tissue and discard in the trash. Wash hands thoroughly with soap and water. 2. You may remove the patch earlier than 72 hours if you experience unpleasant side effects which may include dry mouth, dizziness or visual disturbances. 3. Avoid touching the patch. Wash your hands with soap and water after contact with the patch.

## 2021-03-24 NOTE — Anesthesia Postprocedure Evaluation (Signed)
Anesthesia Post Note  Patient: [name]  Procedure(s) Performed: REVISION BILATERAL BREAST RECONSTRUCTION WITH SILICONE BREAST IMPLANT EXCHANGE (Bilateral: Breast)     Patient location during evaluation: PACU Anesthesia Type: General Level of consciousness: awake and alert Pain management: pain level controlled Vital Signs Assessment: post-procedure vital signs reviewed and stable Respiratory status: spontaneous breathing, nonlabored ventilation, respiratory function stable and patient connected to nasal cannula oxygen Cardiovascular status: blood pressure returned to baseline and stable Postop Assessment: no apparent nausea or vomiting Anesthetic complications: no   No notable events documented.  Last Vitals:  Vitals:   03/24/21 1130 03/24/21 1145  BP: (!) 166/100 (!) 165/92  Pulse: 69 74  Resp: 19 18  Temp:  36.7 C  SpO2: 97% 98%    Last Pain:  Vitals:   03/24/21 1115  TempSrc:   PainSc: [place] Randee [place]

## 2021-03-24 NOTE — Anesthesia Preprocedure Evaluation (Signed)
Anesthesia Evaluation  Patient identified by MRN, date of birth, ID band Patient awake    Reviewed: Allergy & Precautions, NPO status , Patient's Chart, lab work & pertinent test results  Airway Mallampati: III  TM Distance: >3 FB Neck ROM: Full    Dental  (+) Teeth Intact   Pulmonary former smoker,    breath sounds clear to auscultation       Cardiovascular hypertension,  Rhythm:Regular Rate:Normal     Neuro/Psych negative neurological ROS  negative psych ROS   GI/Hepatic Neg liver ROS, GERD  ,  Endo/Other  negative endocrine ROS  Renal/GU negative Renal ROS     Musculoskeletal negative musculoskeletal ROS (+)   Abdominal Normal abdominal exam  (+)   Peds  Hematology negative hematology ROS (+)   Anesthesia Other Findings   Reproductive/Obstetrics                             Anesthesia Physical Anesthesia Plan  ASA: 2  Anesthesia Plan: General   Post-op Pain Management:    Induction: Intravenous  PONV Risk Score and Plan: 4 or greater and Ondansetron, Dexamethasone, Midazolam and Scopolamine patch - Pre-op  Airway Management Planned: Oral ETT  Additional Equipment: None  Intra-op Plan:   Post-operative Plan: Extubation in OR  Informed Consent: I have reviewed the patients History and Physical, chart, labs and discussed the procedure including the risks, benefits and alternatives for the proposed anesthesia with the patient or authorized representative who has indicated his/her understanding and acceptance.     Dental advisory given  Plan Discussed with: CRNA  Anesthesia Plan Comments:         Anesthesia Quick Evaluation

## 2021-03-24 NOTE — Transfer of Care (Signed)
Immediate Anesthesia Transfer of Care Note  Patient: [name]  Procedure(s) Performed: REVISION BILATERAL BREAST RECONSTRUCTION WITH SILICONE BREAST IMPLANT EXCHANGE (Bilateral: Breast)  Patient Location: PACU  Anesthesia Type:General  Level of Consciousness: drowsy  Airway & Oxygen Therapy: Patient Spontanous Breathing and Patient connected to face mask oxygen  Post-op Assessment: Report given to RN and Post -op Vital signs reviewed and stable  Post vital signs: Reviewed and stable  Last Vitals:  Vitals Value Taken Time  BP 150/86 03/24/21 1045  Temp    Pulse 82 03/24/21 1047  Resp    SpO2 100 % 03/24/21 1047  Vitals shown include unvalidated device data.  Last Pain:  Vitals:   03/24/21 0841  TempSrc: Oral  PainSc: 0-No pain      Patients Stated Pain Goal: 6 (62/86/38 1771)  Complications: No notable events documented.

## 2021-03-24 NOTE — Op Note (Signed)
Operative Note   DATE OF OPERATION: 7.15.22  LOCATION: [facility]-outpatient  SURGICAL DIVISION: Plastic Surgery  PREOPERATIVE DIAGNOSES:  1. History DCIS 2. BRCA2 3. Acquired absence breasts  POSTOPERATIVE DIAGNOSES:  same  PROCEDURE:  Revision bilateral breast reconstruction with silicone implant exhange  SURGEON: Irene Limbo MD MBA  ASSISTANT: none  ANESTHESIA:  General.   EBL: 5 ml  COMPLICATIONS: None immediate.   INDICATIONS FOR PROCEDURE:  The patient, [name], is a 50 y.o. female born on [birth year], is here for revision reconstruction following prepectoral implant based reconstruction, skin reduction pattern mastectomies.   FINDINGS: Removed intact smooth round silicone Natrelle Inspira SRX-650 ml implants. Upon entering capsule, bilateral AP malposition of implants noted. Placed Natrelle Inspira Smooth Round Extra Projection 750 ml implants bilateral. REF SRX-750 RIGHT SN [id number] LEFT SN [id number]  DESCRIPTION OF PROCEDURE:  The patient's operative site was marked in the preoperative area. The patient was taken to the operating room. SCDs were placed and IV antibiotics were given. The patient's operative site was prepped and draped in a sterile fashion. A time out was performed and all information was confirmed to be correct. I began on left side. Incision made through prior inframammary fold scar and carried through superficial fascia to acellular demis. ADM incised. Intact implant removed, as noted above AP malposition noted. Thermal capsulorraphy performed at anterior axillary line, lateral capsule. Sizer placed.   I then directed attention to right chest. Incision made in prior inframammary fold scar and implant cavity entered in similar manner. Implant intact removed; known AP malposition of right side implant preoperatively. Sizer placed. Natrelle Smooth Round Extra Projection 750 ml implant selected for bilateral placement.    Each cavity irrigated  with saline solution containing Betadine, Ancef, gentamicin solution. Hemostasis ensured.The implant was placed in right chest and implant orientation ensured. Closure completed with 3-0 vicryl to close superficial fascia and capsule over implant. 4-0 vicryl used to close dermis followed by 4-0 monocryl subcuticular. Implant placed in left chest cavity. Closure completed in similar fashion. Dermabond applied to chest incisions. Dry dressing applied, followed by breast binder.  The patient was allowed to wake from anesthesia, extubated and taken to the recovery room in satisfactory condition.   SPECIMENS: none  DRAINS: none

## 2021-03-28 ENCOUNTER — Encounter (HOSPITAL_BASED_OUTPATIENT_CLINIC_OR_DEPARTMENT_OTHER): Payer: Self-pay | Admitting: Plastic Surgery

## 2021-07-09 ENCOUNTER — Ambulatory Visit
Admission: EM | Admit: 2021-07-09 | Discharge: 2021-07-09 | Disposition: A | Discharge status: Home / Self Care | Payer: 59

## 2021-07-09 ENCOUNTER — Other Ambulatory Visit: Payer: Self-pay

## 2022-07-22 ENCOUNTER — Ambulatory Visit (INDEPENDENT_AMBULATORY_CARE_PROVIDER_SITE_OTHER): Payer: 59

## 2022-07-22 ENCOUNTER — Encounter: Payer: Self-pay | Admitting: Emergency Medicine

## 2022-07-22 ENCOUNTER — Ambulatory Visit
Admission: EM | Admit: 2022-07-22 | Discharge: 2022-07-22 | Disposition: A | Discharge status: Home / Self Care | Payer: 59 | Attending: Family Medicine | Admitting: Family Medicine

## 2022-07-22 DIAGNOSIS — M79671 Pain in right foot: Secondary | ICD-10-CM

## 2022-07-22 DIAGNOSIS — W19XXXA Unspecified fall, initial encounter: Secondary | ICD-10-CM | POA: Diagnosis not present

## 2022-07-22 NOTE — ED Provider Notes (Signed)
[facility]    CSN: [id number] Arrival date & time: 07/22/22  1537      History   Chief Complaint Chief Complaint  Patient presents with   Foot Pain   Fall    HPI 51 year old female presents for evaluation the above.  Patient reports that she was going down the steps yesterday and as she hit the bottom of the steps she fell and injured her right foot.  She reports pain in the dorsum of the distal aspect of her right foot tickly around the first and second MTP joints.  Pain 5/10 in severity.  Associated swelling.   Past Medical History:  Diagnosis Date   Achalasia    S/p Heller myotomy 1991 and numerous dilations   Breast cancer ([place]) 12/01/2018   right, DCIS   Family history of breast cancer    GERD (gastroesophageal reflux disease)    Hx - None since 2013 Heller myotomy surgery   Hypertension    Pleural effusion 2013   with Heller myotomy surgery    Patient Active Problem List   Diagnosis Date Noted   Vasomotor instability 04/30/2019   Breast cancer, BRCA2 positive ([place]) 02/06/2019   BRCA2 positive 12/24/2018   Genetic testing 12/22/2018   Family history of breast cancer    Family history of prostate cancer    Ductal carcinoma in situ (DCIS) of right breast 12/08/2018   Postoperative state 12/02/2013   Chest pain 11/29/2011   D-dimer, elevated 10/30/2011   Aspiration pneumonia ([place]) 10/28/2011   Achalasia 10/18/2011   Leukocytosis 10/18/2011   Thrombocytosis 10/18/2011   Pericardial effusion 10/18/2011   Pleural effusion, left 10/18/2011   Microcytic anemia 10/18/2011   Tachycardia 10/18/2011   SIRS (systemic inflammatory response syndrome) ([place]) 10/18/2011    Past Surgical History:  Procedure Laterality Date   ACHALASIA SURGERY  1991   transthoracic Heller myotomy in [place] SALPINGECTOMY Bilateral 12/02/2013   Procedure: BILATERAL SALPINGECTOMY;  Surgeon: Princess Bruins, MD;  Location: [facility];  Service: Gynecology;   Laterality: Bilateral;   BREAST IMPLANT EXCHANGE Bilateral 03/24/2021   Procedure: REVISION BILATERAL BREAST RECONSTRUCTION WITH SILICONE BREAST IMPLANT EXCHANGE;  Surgeon: Irene Limbo, MD;  Location: [place];  Service: Plastics;  Laterality: Bilateral;   BREAST RECONSTRUCTION WITH PLACEMENT OF TISSUE EXPANDER AND FLEX HD (ACELLULAR HYDRATED DERMIS) Bilateral 01/23/2019   Procedure: BREAST RECONSTRUCTION WITH PLACEMENT OF TISSUE EXPANDER AND FLEX HD (ACELLULAR HYDRATED DERMIS);  Surgeon: Irene Limbo, MD;  Location: [place];  Service: Plastics;  Laterality: Bilateral;   BREAST SURGERY Bilateral 01/23/2019   CESAREAN SECTION     x2   ESOPHAGOGASTRODUODENOSCOPY  10/08/2011   Procedure: ESOPHAGOGASTRODUODENOSCOPY (EGD);  Surgeon: Jeryl Columbia, MD;  Location: [facility]COPY;  Service: Endoscopy;  Laterality: N/A;   ESOPHAGOGASTRODUODENOSCOPY  10/16/2011   Procedure: ESOPHAGOGASTRODUODENOSCOPY (EGD);  Surgeon: Cleotis Nipper, MD;  Location: [facility];  Service: Endoscopy;  Laterality: N/A;   ESOPHAGOGASTRODUODENOSCOPY  11/06/2011   Procedure: ESOPHAGOGASTRODUODENOSCOPY (EGD);  Surgeon: Lear Ng, MD;  Location: [facility];  Service: Endoscopy;  Laterality: N/A;   FOREIGN BODY REMOVAL  10/08/2011   Procedure: FOREIGN BODY REMOVAL;  Surgeon: Jeryl Columbia, MD;  Location: [facility] ENDOSCOPY;  Service: Endoscopy;  Laterality: N/A;  botox  needed [place] BODY REMOVAL  10/16/2011   Procedure: FOREIGN BODY REMOVAL;  Surgeon: Cleotis Nipper, MD;  Location: [facility] ENDOSCOPY;  Service: Endoscopy;  Laterality: N/A;   HELLER MYOTOMY  11/07/2011  Procedure: LAPAROSCOPIC HELLER MYOTOMY;  Surgeon: Pedro Earls, MD;  Location: [facility];  Service: General;  Laterality: N/A;  Laparoscopic Redo Heller Myotomy    LIPOSUCTION WITH LIPOFILLING Bilateral 04/24/2019   Procedure: LIPOFILLING TO BILATERAL CHEST, LIPOSUCTION FROM  LATERAL CHEST WALL AND BILATERAL FLANKS;   Surgeon: Irene Limbo, MD;  Location: [place];  Service: Plastics;  Laterality: Bilateral;   MASTECTOMY W/ SENTINEL NODE BIOPSY Bilateral 01/23/2019   Procedure: RIGHT MASTECTOMY WITH RIGHT AXILLARY SENTINEL LYMPH NODE BIOPSY AND LEFT RISK REDUCING MASTECTOMY;  Surgeon: Rolm Bookbinder, MD;  Location: [place];  Service: General;  Laterality: Bilateral;   REMOVAL OF BILATERAL TISSUE EXPANDERS WITH PLACEMENT OF BILATERAL BREAST IMPLANTS Bilateral 04/24/2019   Procedure: REMOVAL OF BILATERAL TISSUE EXPANDERS WITH PLACEMENT OF BILATERAL SILICONE BREAST IMPLANTS;  Surgeon: Irene Limbo, MD;  Location: [place];  Service: Plastics;  Laterality: Bilateral;   ROBOTIC ASSISTED SALPINGO OOPHERECTOMY Bilateral 03/26/2019   Procedure: ROBOTIC ASSISTED SALPINGO OOPHORECTOMY;  Surgeon: Janie Morning, MD;  Location: [facility];  Service: Gynecology;  Laterality: Bilateral;   ROBOTIC ASSISTED TOTAL HYSTERECTOMY N/A 12/02/2013   Procedure: ROBOTIC ASSISTED TOTAL HYSTERECTOMY;  Surgeon: Princess Bruins, MD;  Location: [place] [facility];  Service: Gynecology;  Laterality: N/A;   TONSILLECTOMY  1995    OB History   No obstetric history on file.      Home Medications    Prior to Admission medications   Medication Sig Start Date End Date Taking? Authorizing Provider  OZEMPIC, 0.25 OR 0.5 MG/DOSE, 2 MG/3ML SOPN Inject 0.5 mg into the skin once a week. 07/16/22  Yes [provider]  APPLE CIDER VINEGAR PO Take 1 tablet by mouth daily. Gummy    [provider]  Biotin 5000 MCG TABS Take 5,000 mcg by mouth daily.    [provider]  EPINEPHrine 0.3 mg/0.3 mL IJ SOAJ injection Inject into the muscle. As needed for shrimp allergy 08/26/15   [provider]  ibuprofen (ADVIL) 600 MG tablet Take 1 tablet (600 mg total) by mouth every 6 (six) hours as needed for moderate pain. For AFTER surgery 03/11/19   Dorothyann Gibbs, NP     Family History Family History  Problem Relation Age of Onset   Hypertension Mother    Prostate cancer Father 71   Breast cancer Paternal Grandmother        late 68s?   Lung cancer Brother 52   Other Maternal Grandmother        Childbirth   Anesthesia problems Neg Hx    Hypotension Neg Hx    Malignant hyperthermia Neg Hx    Pseudochol deficiency Neg Hx     Social History Social History   Tobacco Use   Smoking status: Former    Packs/day: 0.25    Years: 0.50    Total pack years: 0.13    Types: Cigarettes    Quit date: 10/07/1990    Years since quitting: 31.8   Smokeless tobacco: Never   Tobacco comments:    Smoked for 2 months, nothing heavier  Vaping Use   Vaping Use: Never used  Substance Use Topics   Alcohol use: Yes    Alcohol/week: 1.0 standard drink of alcohol    Types: 1 Glasses of wine per week    Comment: Rarely   Drug use: No     Allergies   Lisinopril-hydrochlorothiazide and Shellfish allergy   Review of Systems Review of Systems Per HPI  Physical Exam  Triage Vital Signs ED Triage Vitals  Enc Vitals Group     BP 07/22/22 1545 (!) 196/103     Pulse Rate 07/22/22 1545 67     Resp 07/22/22 1545 14     Temp 07/22/22 1545 98.8 F (37.1 C)     Temp Source 07/22/22 1545 Oral     SpO2 07/22/22 1545 97 %     Weight 07/22/22 1543 210 lb (95.3 kg)     Height 07/22/22 1543 5' 6" (1.676 m)     Head Circumference --      Peak Flow --      Pain Score 07/22/22 1543 5     Pain Loc --      Pain Edu? --      Excl. in [place]? --     Updated Vital Signs BP (!) 196/103 (BP Location: Left Arm)   Pulse 67   Temp 98.8 F (37.1 C) (Oral)   Resp 14   Ht 5' 6" (1.676 m)   Wt 95.3 kg   LMP 11/17/2013   SpO2 97%   BMI 33.89 kg/m   Visual Acuity Right Eye Distance:   Left Eye Distance:   Bilateral Distance:    Right Eye Near:   Left Eye Near:    Bilateral Near:     Physical Exam Vitals and nursing note reviewed.  Constitutional:      General:  She is not in acute distress.    Appearance: Normal appearance.  HENT:     Head: Normocephalic and atraumatic.  Cardiovascular:     Rate and Rhythm: Normal rate and regular rhythm.  Pulmonary:     Effort: Pulmonary effort is normal.     Breath sounds: Normal breath sounds.  Musculoskeletal:     Comments: Right foot -mild tenderness and mild swelling below the first and second MTP joints.   Neurological:     Mental Status: She is alert.      UC Treatments / Results  Labs (all labs ordered are listed, but only abnormal results are displayed) Labs Reviewed - No data to display  EKG   Radiology DG Foot Complete Right  Result Date: 07/22/2022 CLINICAL DATA:  Right foot pain status post fall yesterday EXAM: RIGHT FOOT COMPLETE - 3 VIEW COMPARISON:  None Available. FINDINGS: There is no evidence of fracture or dislocation. There is mild osteophytosis and subchondral sclerosis of the first MTP joint. The remainder of the joint spaces are well-preserved. There is moderate soft tissue swelling around the ankle. IMPRESSION: No acute fracture or dislocation. Moderate soft tissue swelling around the ankle. Electronically Signed   By: Beryle Flock M.D.   On: 07/22/2022 15:59    Procedures Procedures (including critical care time)  Medications Ordered in UC Medications - No data to display  Initial Impression / Assessment and Plan / UC Course  I have reviewed the triage vital signs and the nursing notes.  Pertinent labs & imaging results that were available during my care of the patient were reviewed by me and considered in my medical decision making (see chart for details).    52 year old female presents with right foot pain.  X-ray was obtained and was independently reviewed by me.  Interpretation: Normal x-ray.  No evidence of fracture.  Advised rest, ice, elevation.  Over-the-counter NSAIDs as needed.  Of note, patient's blood pressure markedly elevated today.  I believe that this  is a spurious result.  Recently seen a month ago and blood pressure was  fairly well controlled.  She has a history of hypertension.  Needs follow-up with PCP.  Final Clinical Impressions(s) / UC Diagnoses   Final diagnoses:  Right foot pain     Discharge Instructions      No fracture.   Rest, ice, elevation.  Ibuprofen or naproxen as needed.  Take care  Dr. Lacinda Axon    ED Prescriptions   None    PDMP not reviewed this encounter.   Coral Spikes, [place] 07/22/22 1628

## 2022-07-22 NOTE — Discharge Instructions (Signed)
No fracture.   Rest, ice, elevation.  Ibuprofen or naproxen as needed.  Take care  Dr. Lacinda Axon

## 2022-07-22 NOTE — ED Triage Notes (Signed)
Patient states that she fell off steps yesterday and injured the top of her right foot.  Patient reports pain between her 1st and 2nd toe.

## 2022-07-23 ENCOUNTER — Ambulatory Visit: Payer: Self-pay

## 2023-03-11 ENCOUNTER — Encounter: Payer: Self-pay | Admitting: Emergency Medicine

## 2023-03-11 ENCOUNTER — Ambulatory Visit
Admission: EM | Admit: 2023-03-11 | Discharge: 2023-03-11 | Disposition: A | Discharge status: Home / Self Care | Payer: Self-pay | Attending: Emergency Medicine | Admitting: Emergency Medicine

## 2023-03-11 ENCOUNTER — Ambulatory Visit (INDEPENDENT_AMBULATORY_CARE_PROVIDER_SITE_OTHER): Payer: Self-pay

## 2023-03-11 DIAGNOSIS — S62515A Nondisplaced fracture of proximal phalanx of left thumb, initial encounter for closed fracture: Secondary | ICD-10-CM
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
## 2023-03-11 NOTE — Discharge Instructions (Signed)
Today you were evaluated for your thumb pain  Break in the bone as confirmed on x-ray  You have been placed in a thumb spica please leave in place at all times.  Stability  Continue to take ibuprofen or Tylenol as needed for management of pain  Please schedule follow-up appointment in 1 week with orthopedic specialist, information is listed on front page
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
